# Patient Record
Sex: Male | Born: 1935 | Race: White | Hispanic: No | Marital: Married | Smoking: Never smoker
Health system: Southern US, Community
[De-identification: ages and names within clinical notes are randomized; demographics above are authoritative.]

## PROBLEM LIST (undated history)

## (undated) DIAGNOSIS — I509 Heart failure, unspecified: Secondary | ICD-10-CM

## (undated) DIAGNOSIS — J189 Pneumonia, unspecified organism: Secondary | ICD-10-CM

## (undated) DIAGNOSIS — E119 Type 2 diabetes mellitus without complications: Secondary | ICD-10-CM

## (undated) DIAGNOSIS — N200 Calculus of kidney: Secondary | ICD-10-CM

## (undated) DIAGNOSIS — K922 Gastrointestinal hemorrhage, unspecified: Secondary | ICD-10-CM

## (undated) DIAGNOSIS — S46219A Strain of muscle, fascia and tendon of other parts of biceps, unspecified arm, initial encounter: Secondary | ICD-10-CM

## (undated) DIAGNOSIS — N400 Enlarged prostate without lower urinary tract symptoms: Secondary | ICD-10-CM

## (undated) DIAGNOSIS — N390 Urinary tract infection, site not specified: Secondary | ICD-10-CM

## (undated) DIAGNOSIS — K5792 Diverticulitis of intestine, part unspecified, without perforation or abscess without bleeding: Secondary | ICD-10-CM

## (undated) DIAGNOSIS — K219 Gastro-esophageal reflux disease without esophagitis: Secondary | ICD-10-CM

## (undated) DIAGNOSIS — R112 Nausea with vomiting, unspecified: Secondary | ICD-10-CM

## (undated) DIAGNOSIS — I10 Essential (primary) hypertension: Secondary | ICD-10-CM

## (undated) DIAGNOSIS — T8859XA Other complications of anesthesia, initial encounter: Secondary | ICD-10-CM

## (undated) DIAGNOSIS — T4145XA Adverse effect of unspecified anesthetic, initial encounter: Secondary | ICD-10-CM

## (undated) DIAGNOSIS — Z9889 Other specified postprocedural states: Secondary | ICD-10-CM

## (undated) DIAGNOSIS — I4891 Unspecified atrial fibrillation: Secondary | ICD-10-CM

## (undated) DIAGNOSIS — M109 Gout, unspecified: Secondary | ICD-10-CM

## (undated) HISTORY — PX: HEMORRHOID SURGERY: SHX153

## (undated) HISTORY — PX: LITHOTRIPSY: SUR834

## (undated) HISTORY — PX: CATARACT EXTRACTION, BILATERAL: SHX1313

## (undated) HISTORY — PX: PALATE / UVULA BIOPSY / EXCISION: SUR128

## (undated) HISTORY — PX: CARDIOVERSION: EP1203

## (undated) HISTORY — PX: CHOLECYSTECTOMY: SHX55

## (undated) HISTORY — PX: HERNIA REPAIR: SHX51

## (undated) HISTORY — PX: OTHER SURGICAL HISTORY: SHX169

---

## 1998-09-25 ENCOUNTER — Encounter: Payer: Self-pay | Admitting: Cardiology

## 1998-09-25 ENCOUNTER — Ambulatory Visit (HOSPITAL_COMMUNITY): Admission: RE | Admit: 1998-09-25 | Discharge: 1998-09-25 | Payer: Self-pay | Admitting: Cardiology

## 1999-07-19 ENCOUNTER — Encounter: Payer: Self-pay | Admitting: Gastroenterology

## 1999-07-19 ENCOUNTER — Encounter: Admission: RE | Admit: 1999-07-19 | Discharge: 1999-07-19 | Payer: Self-pay | Admitting: Gastroenterology

## 2004-02-18 ENCOUNTER — Observation Stay (HOSPITAL_COMMUNITY): Admission: EM | Admit: 2004-02-18 | Discharge: 2004-02-18 | Payer: Self-pay | Admitting: Emergency Medicine

## 2004-06-24 ENCOUNTER — Ambulatory Visit (HOSPITAL_COMMUNITY): Admission: RE | Admit: 2004-06-24 | Discharge: 2004-06-24 | Payer: Self-pay | Admitting: Urology

## 2004-08-15 DIAGNOSIS — S46219A Strain of muscle, fascia and tendon of other parts of biceps, unspecified arm, initial encounter: Secondary | ICD-10-CM

## 2004-08-15 HISTORY — PX: DISTAL BICEPS TENDON REPAIR: SHX1461

## 2004-08-15 HISTORY — DX: Strain of muscle, fascia and tendon of other parts of biceps, unspecified arm, initial encounter: S46.219A

## 2005-04-06 ENCOUNTER — Ambulatory Visit (HOSPITAL_COMMUNITY): Admission: RE | Admit: 2005-04-06 | Discharge: 2005-04-06 | Payer: Self-pay | Admitting: Orthopedic Surgery

## 2007-01-01 ENCOUNTER — Ambulatory Visit: Payer: Self-pay | Admitting: Internal Medicine

## 2010-12-28 NOTE — Letter (Signed)
Jan 01, 2007     RE:  ARLAN, BIRKS  MRN:  [id number]  /  DOB:  [birth year]   Jaclyn Prime. Lucas Mallow, M.D.  [address]  [place], [place] [address]   Dear Onalee Hua:   Thank you for referring Ricardo [name] for EP evaluation. As you  know, he is a very pleasant 75 year old male with a history of atrial  flutter, who also has a history of hypertension. His medications include  potassium, __________, HCTZ, Tiazac, Cozaar, and low-dose aspirin. As  you know, the patient has been unable to take Coumadin in the past,  secondary to recurrent problems with epistaxis. He has maintained atrial  flutter for the last year, despite being on amiodarone. The patient has  very little in the way of symptoms with regard to his atrial  arrhythmias, though I suspect that they came on so subtlely, that he has  just not been particularly symptomatic and learned to live with his  flutter. Today, we spent a fair amount of time talking about the issues  of treatment. Because of his need to not be on Coumadin long-term and  because of his thromboembolic risk of atrial flutter, I have recommended  proceeding with catheterization ablation. The patient is considering his  options. He will call us if he would like to proceed further with  ablation therapy. With regard to his Coumadin, if he underwent ablation,  we would plan on proceeding with a TEE and 3 to 4 days of Coumadin,  followed by the ablation, followed by 2 to 3 weeks of Coumadin,  following by discontinuation of the Coumadin. I hope that he would be  tolerant of this short duration of his Coumadin therapy. I do think that  he has an ongoing risk for stroke and for this reason, strongly  considering catheter ablation would be warranted. I have also counseled  that there is a small chance, even if he undergoes ablation, that atrial  fibrillation could develop itself, as these patient's with atrial  flutter, who undergo  ablation, do have a  slight increased risk of developing atrial  fibrillation compared to the basal population.   Once again Theodoro Grist, thank you for referring Ricardo [name] for EP evaluation. I  will keep you apprised as to whether he decides to proceed with ablation  and now this results.    Sincerely,      Doylene Canning. Ladona Ridgel, MD  Electronically Signed    GWT/MedQ  DD: 01/01/2007  DT: 01/01/2007  Job #: [id number]

## 2010-12-28 NOTE — Assessment & Plan Note (Signed)
[facility]                         ELECTROPHYSIOLOGY OFFICE NOTE   Ricardo, [name]                      MRN:          [id number]  DATE:01/01/2007                            DOB:          1935/10/02    REFERRING PHYSICIAN:  Jaclyn Prime. Lucas Mallow, M.D.   Ricardo [name] is a referred today by Dr. Aggie Cosier for consideration and  evaluation of atrial flutter.   HISTORY OF PRESENT ILLNESS:  The patient is a very pleasant 75 year old  man who gives a history of an irregular heart beat now for over 40  years, and over the last year he has been diagnosed with atrial flutter.  He unfortunately on Coumadin developed problems with recurrent epistaxis  and as a consequence had to have his Coumadin therapy discontinued.  He  is referred today for consideration for catheter ablation.  The patient  states that he does not feel much in the way of dyspnea.  He continues  to work a regular day.  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  He has had no more nose bleeds since not being on Coumadin.  He  has never had syncope.  He denies chest pain with exertion.  </   PAST MEDICAL HISTORY:  1. Hypertension for over 15 years.  2. He has history of tonsillectomy, hemorrhoidectomy, gallbladder      surgery and history of problems with gout.   FAMILY HISTORY:  Notable for a mother who died at age 72 and father died  at age 73, unknown causes.   MEDICATIONS:  1. Potassium  2. Inspra  3. Cordarone  4. Hydrochlorothiazide  5. Tiazac  6. Cozaar  7. Aspirin   SOCIAL HISTORY:  The patient works as a Engineer, civil (consulting).  He  denies tobacco use.  He does drink one glass of alcohol per week.   REVIEW OF SYSTEMS:  As noted in the HPI, has problems with gouty  arthritis.  Otherwise all systems reviewed and found to be negative.  The patient is quite stoic.   PHYSICAL EXAMINATION:  He is a pleasant, obese 75 year old man in no  acute distress.  His blood  pressure today was 140/90, the pulse was 90  and irregular.  Respirations were 18.  Weight was 215 pounds.  HEENT:  Normocephalic and atraumatic.  Pupils equal and round.  Oropharynx was moist.  Sclerae were anicteric.  NECK:  No Jugular venous distention, no thyromegaly.  Trachea was  midline.  The carotids were 2+ and symmetric.  LUNGS:  Clear bilaterally to auscultation.  There were no wheezing,  rales, or rhonchi.  CARDIOVASCULAR:  Irregular rhythm with normal S1 and S2.  I did not  appreciate any murmurs, rubs or gallops.  ABDOMEN:  Obese, nontender, nondistended.  There was no organomegaly.  Bowel sounds were present.  There was no rebounding or guarding.  EXTREMITIES:  No cyanosis, clubbing or edema.  The pulses were 2+ and  symmetric.  NEURO:  Alert and oriented x3.  Cranial nerves were intact.  Strength  was 5/5 and symmetric.   EKG demonstrates  atrial flutter with a variable ventricular response.   IMPRESSION:  1. Atrial flutter.  2. Hypertension.  3. Obesity.  4. History of nose bleeds on Coumadin.   DISCUSSION:  I discussed treatment options with Ricardo [name] in detail.  I  have recommended proceeding with electrophysiological _________ catheter  ablation.  He would require a TEE and Coumadin initiation for several  weeks after his ablation, though I think this gives the best chance of  minimizing his risk for stroke and helping him to reduce the risk of  bleeding if we can maintain him in sinus rhythm and also reduce the risk  for the need for Coumadin.  If he decides to proceed with ablation, we  will plan on scheduling this at the earliest possible convenient time.     Doylene Canning. Ladona Ridgel, MD  Electronically Signed    GWT/MedQ  DD: 01/01/2007  DT: 01/01/2007  Job #: [id number]   cc:   Jaclyn Prime. Lucas Mallow, M.D.

## 2012-04-17 ENCOUNTER — Observation Stay (HOSPITAL_COMMUNITY)
Admission: EM | Admit: 2012-04-17 | Discharge: 2012-04-18 | DRG: 174 | Disposition: A | Discharge status: Home / Self Care | Payer: BC Managed Care – PPO | Attending: Internal Medicine | Admitting: Internal Medicine

## 2012-04-17 ENCOUNTER — Encounter (HOSPITAL_COMMUNITY): Payer: Self-pay | Admitting: Emergency Medicine

## 2012-04-17 DIAGNOSIS — K5792 Diverticulitis of intestine, part unspecified, without perforation or abscess without bleeding: Secondary | ICD-10-CM | POA: Diagnosis present

## 2012-04-17 DIAGNOSIS — D649 Anemia, unspecified: Secondary | ICD-10-CM | POA: Insufficient documentation

## 2012-04-17 DIAGNOSIS — K5790 Diverticulosis of intestine, part unspecified, without perforation or abscess without bleeding: Secondary | ICD-10-CM

## 2012-04-17 DIAGNOSIS — R1032 Left lower quadrant pain: Secondary | ICD-10-CM | POA: Insufficient documentation

## 2012-04-17 DIAGNOSIS — K5732 Diverticulitis of large intestine without perforation or abscess without bleeding: Secondary | ICD-10-CM

## 2012-04-17 DIAGNOSIS — K922 Gastrointestinal hemorrhage, unspecified: Secondary | ICD-10-CM | POA: Diagnosis present

## 2012-04-17 DIAGNOSIS — I4891 Unspecified atrial fibrillation: Secondary | ICD-10-CM

## 2012-04-17 DIAGNOSIS — K921 Melena: Secondary | ICD-10-CM

## 2012-04-17 DIAGNOSIS — N400 Enlarged prostate without lower urinary tract symptoms: Secondary | ICD-10-CM

## 2012-04-17 DIAGNOSIS — K5730 Diverticulosis of large intestine without perforation or abscess without bleeding: Secondary | ICD-10-CM | POA: Insufficient documentation

## 2012-04-17 DIAGNOSIS — Z7901 Long term (current) use of anticoagulants: Secondary | ICD-10-CM | POA: Insufficient documentation

## 2012-04-17 DIAGNOSIS — N200 Calculus of kidney: Secondary | ICD-10-CM

## 2012-04-17 DIAGNOSIS — E876 Hypokalemia: Secondary | ICD-10-CM | POA: Insufficient documentation

## 2012-04-17 HISTORY — DX: Benign prostatic hyperplasia without lower urinary tract symptoms: N40.0

## 2012-04-17 HISTORY — DX: Calculus of kidney: N20.0

## 2012-04-17 HISTORY — DX: Unspecified atrial fibrillation: I48.91

## 2012-04-17 HISTORY — DX: Strain of muscle, fascia and tendon of other parts of biceps, unspecified arm, initial encounter: S46.219A

## 2012-04-17 HISTORY — DX: Diverticulitis of intestine, part unspecified, without perforation or abscess without bleeding: K57.92

## 2012-04-17 HISTORY — DX: Gout, unspecified: M10.9

## 2012-04-17 LAB — PREPARE RBC (CROSSMATCH)

## 2012-04-17 LAB — CBC
HCT: 35.7 % — ABNORMAL LOW (ref 39.0–52.0)
Hemoglobin: 12.1 g/dL — ABNORMAL LOW (ref 13.0–17.0)
Hemoglobin: 12.3 g/dL — ABNORMAL LOW (ref 13.0–17.0)
RBC: 3.96 MIL/uL — ABNORMAL LOW (ref 4.22–5.81)
RBC: 4.15 MIL/uL — ABNORMAL LOW (ref 4.22–5.81)
RDW: 14.6 % (ref 11.5–15.5)
WBC: 11 10*3/uL — ABNORMAL HIGH (ref 4.0–10.5)
WBC: 8.9 10*3/uL (ref 4.0–10.5)

## 2012-04-17 LAB — COMPREHENSIVE METABOLIC PANEL
AST: 33 U/L (ref 0–37)
Albumin: 3.6 g/dL (ref 3.5–5.2)
BUN: 16 mg/dL (ref 6–23)
CO2: 28 mEq/L (ref 19–32)
Calcium: 9.2 mg/dL (ref 8.4–10.5)
Creatinine, Ser: 1.09 mg/dL (ref 0.50–1.35)
GFR calc non Af Amer: 65 mL/min — ABNORMAL LOW (ref >90)
Total Bilirubin: 0.8 mg/dL (ref 0.3–1.2)

## 2012-04-17 LAB — CBC WITH DIFFERENTIAL/PLATELET
Basophils Absolute: 0 10*3/uL (ref 0.0–0.1)
Basophils Relative: 0 % (ref 0–1)
Eosinophils Relative: 1 % (ref 0–5)
MCHC: 35.1 g/dL (ref 30.0–36.0)
MCV: 86.3 fL (ref 78.0–100.0)
Monocytes Absolute: 1 10*3/uL (ref 0.1–1.0)
Monocytes Relative: 8 % (ref 3–12)
RBC: 4.66 MIL/uL (ref 4.22–5.81)
RDW: 14.5 % (ref 11.5–15.5)
WBC: 12.9 10*3/uL — ABNORMAL HIGH (ref 4.0–10.5)

## 2012-04-17 LAB — PROTIME-INR: INR: 2.1 — ABNORMAL HIGH (ref 0.00–1.49)

## 2012-04-17 MED ORDER — ONDANSETRON HCL 4 MG PO TABS: 4.0000 mg | ORAL_TABLET | Freq: Four times a day (QID) | ORAL | Status: DC | PRN

## 2012-04-17 MED ORDER — POTASSIUM CHLORIDE 2 MEQ/ML IV SOLN
INTRAVENOUS | Status: DC
  Administered 2012-04-17: 13:00:00 via INTRAVENOUS
  Filled 2012-04-17: qty 1000

## 2012-04-17 MED ORDER — LOSARTAN POTASSIUM 50 MG PO TABS
50.0000 mg | ORAL_TABLET | Freq: Every day | ORAL | Status: DC
  Administered 2012-04-17 – 2012-04-18 (×2): 50 mg via ORAL
  Filled 2012-04-17 (×2): qty 1

## 2012-04-17 MED ORDER — SODIUM CHLORIDE 0.9 % IV SOLN
INTRAVENOUS | Status: AC
  Administered 2012-04-17: 75 mL/h via INTRAVENOUS

## 2012-04-17 MED ORDER — OXYCODONE HCL 5 MG PO TABS: 5.0000 mg | ORAL_TABLET | ORAL | Status: DC | PRN

## 2012-04-17 MED ORDER — ACETAMINOPHEN 325 MG PO TABS: 650.0000 mg | ORAL_TABLET | Freq: Four times a day (QID) | ORAL | Status: DC | PRN

## 2012-04-17 MED ORDER — ACETAMINOPHEN 650 MG RE SUPP: 650.0000 mg | Freq: Four times a day (QID) | RECTAL | Status: DC | PRN

## 2012-04-17 MED ORDER — PANTOPRAZOLE SODIUM 40 MG IV SOLR
40.0000 mg | INTRAVENOUS | Status: DC
  Administered 2012-04-17: 40 mg via INTRAVENOUS

## 2012-04-17 MED ORDER — VITAMIN K1 10 MG/ML IJ SOLN
5.0000 mg | Freq: Once | INTRAMUSCULAR | Status: AC
  Administered 2012-04-17: 5 mg via INTRAVENOUS
  Filled 2012-04-17 (×2): qty 0.5

## 2012-04-17 MED ORDER — SODIUM CHLORIDE 0.9 % IV SOLN
INTRAVENOUS | Status: DC
  Administered 2012-04-17: 16:00:00 via INTRAVENOUS

## 2012-04-17 MED ORDER — ONDANSETRON HCL 4 MG/2ML IJ SOLN: 4.0000 mg | Freq: Four times a day (QID) | INTRAMUSCULAR | Status: DC | PRN

## 2012-04-17 MED ORDER — SODIUM CHLORIDE 0.9 % IV SOLN: INTRAVENOUS | Status: DC

## 2012-04-17 MED ORDER — DILTIAZEM HCL ER BEADS 120 MG PO CP24: 360.0000 mg | ORAL_CAPSULE | Freq: Every day | ORAL | Status: DC

## 2012-04-17 MED ORDER — DILTIAZEM HCL ER COATED BEADS 360 MG PO CP24
360.0000 mg | ORAL_CAPSULE | Freq: Every day | ORAL | Status: DC
  Administered 2012-04-17 – 2012-04-18 (×2): 360 mg via ORAL
  Filled 2012-04-17 (×2): qty 1

## 2012-04-17 MED ORDER — SENNOSIDES-DOCUSATE SODIUM 8.6-50 MG PO TABS
1.0000 | ORAL_TABLET | Freq: Every evening | ORAL | Status: DC | PRN
  Filled 2012-04-17: qty 1

## 2012-04-17 MED ORDER — ALLOPURINOL 300 MG PO TABS
300.0000 mg | ORAL_TABLET | Freq: Every day | ORAL | Status: DC
  Administered 2012-04-17 – 2012-04-18 (×2): 300 mg via ORAL
  Filled 2012-04-17 (×3): qty 1

## 2012-04-17 MED ORDER — MORPHINE SULFATE 2 MG/ML IJ SOLN: 1.0000 mg | INTRAMUSCULAR | Status: DC | PRN

## 2012-04-17 MED ORDER — PEG-KCL-NACL-NASULF-NA ASC-C 100 G PO SOLR
1.0000 | Freq: Once | ORAL | Status: AC
  Administered 2012-04-17: 100 g via ORAL
  Filled 2012-04-17: qty 1

## 2012-04-17 NOTE — Consult Note (Signed)
[facility] Consult: 1:57 PM 04/17/2012   Referring Provider: Dr Wellington Hampshire in ED Primary Care Physician:  Thayer Headings, MD,  Never goes to see him. Gets only care from cardiologist in [place], Dr Duard Brady Primary Gastroenterologist:  Remotely saw Dr Randa Evens   Reason for Consultation:  Painless hematochezia.  HPI: Ricardo [name] is a 75 y.o. male.  Chronic Coumadin for hx of refractory afib, despite DCCVs and ablation in past.  Apparent hx of diverticulitis, though 2 CT scans I reviewed show only diverticulosis, especially in sigmoid.  Has refused colonoscopy in past.  S/P hemorrhoidectomies x 2, remotely.  No recent hx of rectal bleeding until now.  Self treated LLQ pain, his "diverticulitis" equivalent, with 7 days of Augmentin beginning 8/22.  Pain resolved.  Never had fever, chills or nausea. He gets standing prescriptions of Augmentin provided by his cardiologist apparently.   Last 4 days having decreased appetite.  No nausea, no belly pain.  No weight loss. No change in stool volume or appearance.  Did have a day or so of no BMs around 8/22, which is typical for "diverticulitis".  5 AM today, first of 3 episodes of large volume, painless hematochezia.  In ED the Hgb is 14.1 and INR is 2.1.  BUN is normal.   Denies dizziness, weakness.   Stopped taking Tiazac on his own, without cardiologist input, about 2 weeks ago because of LE edema, the edema has resolved.  He had read that Tiazac can cause swelling.      Past Medical History  Diagnosis Date  . Diverticulitis     question if he ever had imaging confirmed diverticulitis  . Kidney stones     treated with lithotripsy   . Rupture of biceps tendon 2006    left distal bicep tendon  . BPH (benign prostatic hyperplasia)     by ST in 2000.   Marland Kitchen Atrial fibrillation     on coumadin.  cardiologist is in [facility] dr Duard Brady.  S/P multiple DCCVs and prior ablation.   .  Gout     Past Surgical History  Procedure Date  . Cholecystectomy   . Hemorrhoid surgery     twice  . Distal biceps tendon repair 2006    dr Pecolia Ades  . Lithotripsy     of kidney stones.     Prior to Admission medications   Medication Sig Start Date End Date Taking? Authorizing Provider  allopurinol (ZYLOPRIM) 300 MG tablet Take 300 mg by mouth daily.   Yes Historical Provider, MD  Ascorbic Acid (VITAMIN C PO) Take 1 tablet by mouth daily.   Yes Historical Provider, MD  b complex vitamins tablet Take 1 tablet by mouth daily.   Yes Historical Provider, MD  CALCIUM-MAGNESIUM-VITAMIN D PO Take 1 tablet by mouth daily.   Yes Historical Provider, MD  Coenzyme Q10 (CO Q 10 PO) Take 1 tablet by mouth daily.   Yes Historical Provider, MD  diltiazem (TIAZAC) 360 MG 24 hr capsule He has not used this for at least 2 weeks, self d/c'd due to LE edema Take 360 mg by mouth daily.    Yes Historical Provider, MD  losartan (COZAAR) 50 MG tablet Take 50 mg by mouth daily.   Yes Historical Provider, MD  MAGNESIUM PO Take 1 tablet by mouth daily.   Yes Historical Provider, MD  potassium citrate (UROCIT-K) 10 MEQ (1080 MG) SR tablet Take 20 mEq by mouth daily.   Yes Historical Provider, MD  warfarin (  COUMADIN) 3 MG tablet Take 3 mg by mouth daily.   Yes Historical Provider, MD    Scheduled Meds:    . sodium chloride   Intravenous STAT  . allopurinol  300 mg Oral Daily  . diltiazem  360 mg Oral Daily  . losartan  50 mg Oral Daily  . pantoprazole (PROTONIX) IV  40 mg Intravenous Q24H  . phytonadione (VITAMIN K) IV  5 mg Intravenous Once  . DISCONTD: diltiazem  360 mg Oral Daily   Infusions:    . sodium chloride    . DISCONTD: sodium chloride 0.9 % 1,000 mL with potassium chloride 10 mEq infusion 50 mL/hr at 04/17/12 1300   PRN Meds: acetaminophen, acetaminophen, morphine injection, ondansetron (ZOFRAN) IV, ondansetron, oxyCODONE, senna-docusate   Allergies as of 04/17/2012 - Review  Complete 04/17/2012  Allergen Reaction Noted  . Codeine  04/17/2012    No family history on file.  History   Social History  . Marital Status: Married    Spouse Name: N/A    Number of Children: N/A  . Years of Education: N/A   Occupational History  . Engineer, agricultural"   Social History Main Topics  . Smoking status: Never Smoker   . Smokeless tobacco: Not on file  . Alcohol Use: Does not drink ETOH  . Drug Use: No  . Sexually Active: Not queried.    REVIEW OF SYSTEMS: No chest pain.  No SOB.  No cough.  No pnd. No skin sores or rash No headache.  No tingling or numbness. Pedal edema resolved as per HPI No fainting or presyncope. Low back stiffness on occasion.  No gout attacks for a long time, since started on Allopurinol No ASA or NSAID use No nocturia, urinary incontinence or dribbling stream    PHYSICAL EXAM: Vital signs in last 24 hours: Temp:  [97.9 F (36.6 C)-98 F (36.7 C)] 97.9 F (36.6 C) (09/03 1003) Pulse Rate:  [62-95] 63  (09/03 1046) Resp:  [15-18] 18  (09/03 1046) BP: (146-164)/(77-110) 146/98 mmHg (09/03 1046) SpO2:  [97 %-100 %] 98 % (09/03 1046) Weight:  [186 lb 4.6 oz (84.5 kg)] 186 lb 4.6 oz (84.5 kg) (09/03 1046)  General: looks well, not ill appearing, older WM. Looks his age Head:  No facial asymmetry or swelling  Eyes:  No icterus or conj pallor Ears:  Not HOH.  Nose:  No congestion Mouth:  Moist, pink, clear MM.  Good dentition Neck:  No mass, no JVD Lungs:  Clear, no SOB or cough Heart: Irregularly irregular,  Runs of rapid rates interspersed with runs of irrelular but normal rates Abdomen:  Soft, protuberant, ventral diasthesis, NT.  No masses, no bruits.   Rectal: red blood without mass or hemorrhoids per ED physician.   Musc/Skeltl: no gross joint deformity Extremities:  No pedal edema.  3 polus pedal pulses  Neurologic:  Oriented x 3.  No tremor.  Very alert.  No gross deficits Skin:  No  significant purpura. No sores. Tattoos:  None seen Nodes:  No inguinal masses   Psych:  Pleasant.  Cooperative.   LAB RESULTS:  Basename 04/17/12 0747  WBC 12.9*  HGB 14.1  HCT 40.2  PLT 203  MCV           86   BMET Lab Results  Component Value Date   NA 141 04/17/2012   K 4.5 04/17/2012   CL 100 04/17/2012   CO2 28 04/17/2012   GLUCOSE 154*  04/17/2012   BUN 16 04/17/2012   CREATININE 1.09 04/17/2012   CALCIUM 9.2 04/17/2012   LFT  Basename 04/17/12 0747  PROT 7.0  ALBUMIN 3.6  AST 33  ALT 13  ALKPHOS 59  BILITOT 0.8  BILIDIR --  IBILI --   PT/INR Lab Results  Component Value Date   INR 2.10* 04/17/2012    RADIOLOGY STUDIES: CT scans abd/pelvis in 2000 and 2006 reviewed.   ENDOSCOPIC STUDIES: Refused colonoscopy in the past.  No EGDs  IMPRESSION: *  Lower GI bleed.  Rule out diverticular, rule out neoplasia, rule out colitis, rule out vascular malformation, rule out hemorrhoidal. *  ? Hx of diverticulitis.  The only CT imaging in 2000 and 2006 have not shown diverticulitis, only diverticulosis.  Self treats with Augmentin for episodic LLQ pain.  Latest bout began August 22, with resolution of the pain.  WBCs are moderately elevated.  No fever.  *  Chronic a fib, failed DCCVs and ablation in past.   *  Chronic coumadin. INR is 2.0 *  Hyperglycemia.  No hx of DM.  PLAN: *  Needs colonoscopy when INR is near 1.5.  Pt willing to undergo this.  *  Needs to remain off Coumadin, pt and his wife feel that Dr Orson Aloe in [place] should be notified and phone consulted about this.  *  Keep on clears.  *  INR and CBC in AM *  Not inclined at present to activley reverse the Coumadin, but if ongoing bleeding or HD instability, could give Vit K or FFP. *  IVF at 50 cc/hour:  NS with 10 KCL.  *  Not bleeding enough at present for RBC nuclear scan.    LOS: 0 days   Jennye Moccasin  04/17/2012, 1:57 PM Pager: [phone]  2 PM addendum.  Note that pt was started on IV Protonix,  I  discontinued this.  All signs point to lower GI bleed.  The BUN is normal.  I have not initiated any measures yet regarding prep for colonoscopy except to start clear liquids.     ________________________________________________________________________  [facility] MD note:  I personally examined the patient, reviewed the data and agree with the assessment and plan described above.  Colonoscopy tomorrow at 8am.   Rob Bunting, MD [facility] Pager [phone]

## 2012-04-17 NOTE — Progress Notes (Addendum)
Disposition Note  Ricardo [name], is a 76 y.o. male,   MRN: [id number]  -  DOB - 03-10-1936  Outpatient Primary MD for the patient is Thayer Headings, MD   Blood pressure 149/77, pulse 74, temperature 98 F (36.7 C), temperature source Oral, resp. rate 16, SpO2 100.00%.  Active Problems:  GI bleed  Chronic anticoagulation  Atrial fibrillation  Diverticulitis  Kidney stones  BPH (benign prostatic hyperplasia)    76 yo male with history of afib on coumadin, and diverticulitis reports recent LLQ pain similar to his previous diverticulitis pain in the past.  His started taking Augmentin for this and the pain has improved.  He had 1 episode of a large amount of BRBPR this am.  Vitals are stable (BP high).  Hgb stable at 14, INR approx 2.0.  He has not been given anything to reverse his INR.  Although this gentleman is stable at this point he has had a Large bloody bowel movement this am and is on coumadin.  Because of this I will request a step down bed at least initially.  [facility] has been called to consult.  We will request he remain NPO until they have evaluated him.   Algis Downs, PA-C [facility] Pager: [phone]

## 2012-04-17 NOTE — ED Provider Notes (Signed)
History     CSN: [id number]  Arrival date & time 04/17/12  0712   First MD Initiated Contact with Patient 04/17/12 0719      Chief Complaint  Patient presents with  . Rectal Bleeding    (Consider location/radiation/quality/duration/timing/severity/associated sxs/prior treatment) HPI Comments: Presents today with rectal bleeding. He states that he has recurrent episodes of diverticulitis. He started having some discomfort in his left lower quadrant consistent with his past diverticulitis flareups about a week and half ago. He started taking antibiotics that he had at home which was Augmentin. He states that his pain associated with the diverticulitis is much better. However this morning when he woke up about 5 AM he had a bowel movement which was sure red blood. He's had only one episode of bleeding this morning. However he does state with a large amount of ongoing bleeding in the toilet. He denies any current abdominal pain. Denies any fevers or chills. Denies any nausea or vomiting. Denies any chest pain or shortness of breath. Denies any dizziness. Denies a history of GI bleeds in the past. He does not currently have a gastroenterologist and has never had a colonoscopy in the past. He sees Dr. Thea Silversmith his primary care physician in [place]. But his health is primarily managed by his cardiologist she's in [place].  Patient is a 76 y.o. male presenting with hematochezia.  Rectal Bleeding  Pertinent negatives include no fever, no abdominal pain, no diarrhea, no nausea, no vomiting, no hematuria, no chest pain, no headaches, no coughing and no rash.    Past Medical History  Diagnosis Date  . Atrial fibrillation     on coumadin.  cardiologist is in [facility] dr Duard Brady.  S/P multiple DCCVs and prior ablation.   . Diverticulitis   . Kidney stones   . Rupture of biceps tendon 2006    left distal bicep tendon  . BPH (benign prostatic hyperplasia)     by ST in 2000.      Past Surgical History  Procedure Date  . Cholecystectomy   . Hemorrhoid surgery     twice  . Distal biceps tendon repair 2006    dr Pecolia Ades    No family history on file.  History  Substance Use Topics  . Smoking status: Never Smoker   . Smokeless tobacco: Not on file  . Alcohol Use: No      Review of Systems  Constitutional: Negative for fever, chills, diaphoresis and fatigue.  HENT: Negative for congestion, rhinorrhea and sneezing.   Eyes: Negative.   Respiratory: Negative for cough, chest tightness and shortness of breath.   Cardiovascular: Negative for chest pain and leg swelling.  Gastrointestinal: Positive for blood in stool and hematochezia. Negative for nausea, vomiting, abdominal pain and diarrhea.  Genitourinary: Negative for frequency, hematuria, flank pain and difficulty urinating.  Musculoskeletal: Negative for back pain and arthralgias.  Skin: Negative for rash.  Neurological: Negative for dizziness, speech difficulty, weakness, numbness and headaches.    Allergies  Codeine  Home Medications   Current Outpatient Rx  Name Route Sig Dispense Refill  . ALLOPURINOL 300 MG PO TABS Oral Take 300 mg by mouth daily.    Marland Kitchen VITAMIN C PO Oral Take 1 tablet by mouth daily.    . B COMPLEX PO TABS Oral Take 1 tablet by mouth daily.    Marland Kitchen CALCIUM-MAGNESIUM-VITAMIN D PO Oral Take 1 tablet by mouth daily.    . CO Q 10 PO Oral Take 1  tablet by mouth daily.    Marland Kitchen DILTIAZEM HCL ER BEADS 360 MG PO CP24 Oral Take 360 mg by mouth daily.    Marland Kitchen LOSARTAN POTASSIUM 50 MG PO TABS Oral Take 50 mg by mouth daily.    Marland Kitchen MAGNESIUM PO Oral Take 1 tablet by mouth daily.    Marland Kitchen POTASSIUM CITRATE ER 10 MEQ (1080 MG) PO TBCR Oral Take 20 mEq by mouth daily.    . WARFARIN SODIUM 3 MG PO TABS Oral Take 3 mg by mouth daily.      BP 156/85  Pulse 62  Temp 97.9 F (36.6 C) (Oral)  Resp 15  SpO2 99%  Physical Exam  Constitutional: He is oriented to person, place, and time. He appears  well-developed and well-nourished.  HENT:  Head: Normocephalic and atraumatic.  Eyes: Pupils are equal, round, and reactive to light.  Neck: Normal range of motion. Neck supple.  Cardiovascular: Normal rate, regular rhythm and normal heart sounds.   Pulmonary/Chest: Effort normal and breath sounds normal. No respiratory distress. He has no wheezes. He has no rales. He exhibits no tenderness.  Abdominal: Soft. Bowel sounds are normal. There is tenderness (Very mild tenderness in the left midabdomen). There is no rebound and no guarding.  Genitourinary:       Right red blood noted on rectal exam  Musculoskeletal: Normal range of motion. He exhibits no edema.  Lymphadenopathy:    He has no cervical adenopathy.  Neurological: He is alert and oriented to person, place, and time.  Skin: Skin is warm and dry. No rash noted.  Psychiatric: He has a normal mood and affect.    ED Course  Procedures (including critical care time)  Results for orders placed during the hospital encounter of 04/17/12  CBC WITH DIFFERENTIAL      Component Value Range   WBC 12.9 (*) 4.0 - 10.5 K/uL   RBC 4.66  4.22 - 5.81 MIL/uL   Hemoglobin 14.1  13.0 - 17.0 g/dL   HCT 40.9  81.1 - 91.4 %   MCV 86.3  78.0 - 100.0 fL   MCH 30.3  26.0 - 34.0 pg   MCHC 35.1  30.0 - 36.0 g/dL   RDW 78.2  95.6 - 21.3 %   Platelets 203  150 - 400 K/uL   Neutrophils Relative 78 (*) 43 - 77 %   Neutro Abs 10.1 (*) 1.7 - 7.7 K/uL   Lymphocytes Relative 13  12 - 46 %   Lymphs Abs 1.7  0.7 - 4.0 K/uL   Monocytes Relative 8  3 - 12 %   Monocytes Absolute 1.0  0.1 - 1.0 K/uL   Eosinophils Relative 1  0 - 5 %   Eosinophils Absolute 0.1  0.0 - 0.7 K/uL   Basophils Relative 0  0 - 1 %   Basophils Absolute 0.0  0.0 - 0.1 K/uL  COMPREHENSIVE METABOLIC PANEL      Component Value Range   Sodium 141  135 - 145 mEq/L   Potassium 4.5  3.5 - 5.1 mEq/L   Chloride 100  96 - 112 mEq/L   CO2 28  19 - 32 mEq/L   Glucose, Bld 154 (*) 70 - 99  mg/dL   BUN 16  6 - 23 mg/dL   Creatinine, Ser 0.86  0.50 - 1.35 mg/dL   Calcium 9.2  8.4 - 57.8 mg/dL   Total Protein 7.0  6.0 - 8.3 g/dL   Albumin 3.6  3.5 -  5.2 g/dL   AST 33  0 - 37 U/L   ALT 13  0 - 53 U/L   Alkaline Phosphatase 59  39 - 117 U/L   Total Bilirubin 0.8  0.3 - 1.2 mg/dL   GFR calc non Af Amer 65 (*) >90 mL/min   GFR calc Af Amer 75 (*) >90 mL/min  TYPE AND SCREEN      Component Value Range   ABO/RH(D) O POS     Antibody Screen NEG     Sample Expiration 04/20/2012    PROTIME-INR      Component Value Range   Prothrombin Time 23.9 (*) 11.6 - 15.2 seconds   INR 2.10 (*) 0.00 - 1.49  ABO/RH      Component Value Range   ABO/RH(D) O POS     No results found.   No results found.   Date: 04/17/2012  Rate: 146  Rhythm: atrial fibrillation  QRS Axis: normal  Intervals: normal  ST/T Wave abnormalities: nonspecific ST/T changes  Conduction Disutrbances:none  Narrative Interpretation:   Old EKG Reviewed: none available   1. GI bleed       MDM  Discussed with GI who will see pt.  Also discussed with hospitalist who will admit pt.  Pt has had no further episodes of bleeding in ED, so did not reverse coumadin at this point.        Rolan Bucco, MD 04/17/12 1023

## 2012-04-17 NOTE — ED Notes (Signed)
Pt reports woke up this morning went to have bowel movement, noticed bright red blood. Hx diverticulitis initially complaining of LLQ pain. No pain at this time. Alert x4.

## 2012-04-17 NOTE — ED Notes (Signed)
Pt reporting hx of diverticulitis. This morning woke up to have bm, noticed rectal bleeding this morning. Bright red bleeding. Initially c/o left lower quad pain. No bleeding since then. Skin warm and dry. No pain.

## 2012-04-17 NOTE — ED Notes (Signed)
Gastroenterology at bedside. Pt taken to bathroom by wheelchair, had rectal bleeding. Moderate amount,dark red.

## 2012-04-17 NOTE — H&P (Signed)
[facility]          History and Physical    PCP:   Thayer Headings, MD   Cardiologist: Dr. Orson Aloe at [facility]  Chief Complaint:  Bright red blood per rectum  HPI: Ricardo [name] is a very pleasant 76 y/o man with h/o diverticulosis, atrial fibrillation on chronic anticoagulation with coumadin, who presented to the hospital with the above-mentioned complaints. On Aug 22 he self treated himself with Augmentin for a presumed episode of diverticulitis. He was doing well until this am at 5:00 when he went to the bathroom and noticed a large amount of bright red blood in the toilet. He had a total of 3 episodes at home. He has had another episode while in the step-down unit. He came to the hospital and we are asked to admit him for further evaluation and management. [facility] has already been consulted and has seen the patient.  Allergies:   Allergies  Allergen Reactions  . Codeine       Past Medical History  Diagnosis Date  . Diverticulitis     question if he ever had imaging confirmed diverticulitis  . Kidney stones   . Rupture of biceps tendon 2006    left distal bicep tendon  . BPH (benign prostatic hyperplasia)     by ST in 2000.   Marland Kitchen Atrial fibrillation     on coumadin.  cardiologist is in [facility] dr Duard Brady.  S/P multiple DCCVs and prior ablation.     Past Surgical History  Procedure Date  . Cholecystectomy   . Hemorrhoid surgery     twice  . Distal biceps tendon repair 2006    dr Pecolia Ades    Prior to Admission medications   Medication Sig Start Date End Date Taking? Authorizing Provider  allopurinol (ZYLOPRIM) 300 MG tablet Take 300 mg by mouth daily.   Yes Historical Provider, MD  Ascorbic Acid (VITAMIN C PO) Take 1 tablet by mouth daily.   Yes Historical Provider, MD  b complex vitamins tablet Take 1 tablet by mouth daily.   Yes Historical Provider, MD  CALCIUM-MAGNESIUM-VITAMIN D PO Take 1 tablet by mouth daily.   Yes Historical  Provider, MD  Coenzyme Q10 (CO Q 10 PO) Take 1 tablet by mouth daily.   Yes Historical Provider, MD  diltiazem (TIAZAC) 360 MG 24 hr capsule Take 360 mg by mouth daily.   Yes Historical Provider, MD  losartan (COZAAR) 50 MG tablet Take 50 mg by mouth daily.   Yes Historical Provider, MD  MAGNESIUM PO Take 1 tablet by mouth daily.   Yes Historical Provider, MD  potassium citrate (UROCIT-K) 10 MEQ (1080 MG) SR tablet Take 20 mEq by mouth daily.   Yes Historical Provider, MD  warfarin (COUMADIN) 3 MG tablet Take 3 mg by mouth daily.   Yes Historical Provider, MD    Social History:  reports that he has never smoked. He does not have any smokeless tobacco history on file. He reports that he does not drink alcohol or use illicit drugs.  No family history on file.  Review of Systems:  Constitutional: Denies fever, chills, diaphoresis, appetite change and fatigue.  HEENT: Denies photophobia, eye pain, redness, hearing loss, ear pain, congestion, sore throat, rhinorrhea, sneezing, mouth sores, trouble swallowing, neck pain, neck stiffness and tinnitus.   Respiratory: Denies SOB, DOE, cough, chest tightness,  and wheezing.   Cardiovascular: Denies chest pain, palpitations and leg swelling.  Gastrointestinal: Denies nausea, vomiting,  diarrhea, constipation,  abdominal distention.  Genitourinary: Denies dysuria, urgency, frequency, hematuria, flank pain and difficulty urinating.  Musculoskeletal: Denies myalgias, back pain, joint swelling, arthralgias and gait problem.  Skin: Denies pallor, rash and wound.  Neurological: Denies dizziness, seizures, syncope, weakness, light-headedness, numbness and headaches.  Hematological: Denies adenopathy. Easy bruising, personal or family bleeding history  Psychiatric/Behavioral: Denies suicidal ideation, mood changes, confusion, nervousness, sleep disturbance and agitation   Physical Exam: Blood pressure 156/85, pulse 62, temperature 97.9 F (36.6 C),  temperature source Oral, resp. rate 15, SpO2 99.00%. General: AAOx3 in no current distress. HEENT: [place]/AT, PERRL, EOMI, moist mucous membranes, wears corrective lenses. Neck: no JVD, no LAD, no bruits, no goiter. CV: tachy, irregular, no M/R/G Lungs: CTA B Abdomen: S/NT/ND/+BS/no masses or organomegaly noted. Ext: no C/C/E, +pedal pulses. Neuro: grossly intact and nonfocal. Skin: No rashes seen.  Labs on Admission:  Results for orders placed during the hospital encounter of 04/17/12 (from the past 48 hour(s))  CBC WITH DIFFERENTIAL     Status: Abnormal   Collection Time   04/17/12  7:47 AM      Component Value Range Comment   WBC 12.9 (*) 4.0 - 10.5 K/uL    RBC 4.66  4.22 - 5.81 MIL/uL    Hemoglobin 14.1  13.0 - 17.0 g/dL    HCT 16.1  09.6 - 04.5 %    MCV 86.3  78.0 - 100.0 fL    MCH 30.3  26.0 - 34.0 pg    MCHC 35.1  30.0 - 36.0 g/dL    RDW 40.9  81.1 - 91.4 %    Platelets 203  150 - 400 K/uL    Neutrophils Relative 78 (*) 43 - 77 %    Neutro Abs 10.1 (*) 1.7 - 7.7 K/uL    Lymphocytes Relative 13  12 - 46 %    Lymphs Abs 1.7  0.7 - 4.0 K/uL    Monocytes Relative 8  3 - 12 %    Monocytes Absolute 1.0  0.1 - 1.0 K/uL    Eosinophils Relative 1  0 - 5 %    Eosinophils Absolute 0.1  0.0 - 0.7 K/uL    Basophils Relative 0  0 - 1 %    Basophils Absolute 0.0  0.0 - 0.1 K/uL   COMPREHENSIVE METABOLIC PANEL     Status: Abnormal   Collection Time   04/17/12  7:47 AM      Component Value Range Comment   Sodium 141  135 - 145 mEq/L    Potassium 4.5  3.5 - 5.1 mEq/L HEMOLYSIS AT THIS LEVEL MAY AFFECT RESULT   Chloride 100  96 - 112 mEq/L    CO2 28  19 - 32 mEq/L    Glucose, Bld 154 (*) 70 - 99 mg/dL    BUN 16  6 - 23 mg/dL    Creatinine, Ser 7.82  0.50 - 1.35 mg/dL    Calcium 9.2  8.4 - 95.6 mg/dL    Total Protein 7.0  6.0 - 8.3 g/dL    Albumin 3.6  3.5 - 5.2 g/dL    AST 33  0 - 37 U/L    ALT 13  0 - 53 U/L HEMOLYSIS AT THIS LEVEL MAY AFFECT RESULT   Alkaline Phosphatase 59  39 -  117 U/L HEMOLYSIS AT THIS LEVEL MAY AFFECT RESULT   Total Bilirubin 0.8  0.3 - 1.2 mg/dL    GFR calc non Af Amer 65 (*) >90 mL/min  GFR calc Af Amer 75 (*) >90 mL/min   PROTIME-INR     Status: Abnormal   Collection Time   04/17/12  7:47 AM      Component Value Range Comment   Prothrombin Time 23.9 (*) 11.6 - 15.2 seconds    INR 2.10 (*) 0.00 - 1.49   TYPE AND SCREEN     Status: Normal   Collection Time   04/17/12  8:00 AM      Component Value Range Comment   ABO/RH(D) O POS      Antibody Screen NEG      Sample Expiration 04/20/2012     ABO/RH     Status: Normal   Collection Time   04/17/12  8:00 AM      Component Value Range Comment   ABO/RH(D) O POS     MRSA PCR SCREENING     Status: Normal   Collection Time   04/17/12 10:53 AM      Component Value Range Comment   MRSA by PCR NEGATIVE  NEGATIVE     Radiological Exams on Admission: No results found.  Assessment/Plan Principal Problem:  *GI bleed Active Problems:  Hematochezia  Atrial fibrillation  Diverticulitis  Kidney stones  BPH (benign prostatic hyperplasia)  Chronic anticoagulation   #1 GI Bleed/Hematochezia: Is painless in nature. Likely diverticular. Hb is currently stable at 14.1. Will cycle CBCs q 8 hours and place 2 units of PRBCs on hold. Admit to the SDU, Will reverse coumadin with Vit K in anticipation of colonoscopy in am. (With [facility]).  #2 Atrial Fibrillation: hold coumadin, reverse INR with vit K. Depending on results of colonoscopy may need to decide on risk-benefit of coumadin. Patient would like his long-time cardiologist to be involved with this decision.  #3 DVT Prophylaxis: SCDs  Time Spent on Admission: 60 minutes  Ricardo [name],Ricardo [name] [facility] Pager: [phone] 04/17/2012, 12:58 PM

## 2012-04-18 ENCOUNTER — Encounter (HOSPITAL_COMMUNITY)
Admission: EM | Disposition: A | Discharge status: Home / Self Care | Payer: Self-pay | Source: Home / Self Care | Attending: Internal Medicine

## 2012-04-18 ENCOUNTER — Encounter (HOSPITAL_COMMUNITY): Payer: Self-pay | Admitting: Gastroenterology

## 2012-04-18 DIAGNOSIS — K5790 Diverticulosis of intestine, part unspecified, without perforation or abscess without bleeding: Secondary | ICD-10-CM

## 2012-04-18 DIAGNOSIS — D649 Anemia, unspecified: Secondary | ICD-10-CM | POA: Diagnosis present

## 2012-04-18 DIAGNOSIS — K5730 Diverticulosis of large intestine without perforation or abscess without bleeding: Secondary | ICD-10-CM

## 2012-04-18 HISTORY — PX: COLONOSCOPY: SHX5424

## 2012-04-18 LAB — BASIC METABOLIC PANEL
BUN: 11 mg/dL (ref 6–23)
BUN: 10 mg/dL (ref 6–23)
CO2: 27 mEq/L (ref 19–32)
CO2: 27 mEq/L (ref 19–32)
Chloride: 102 mEq/L (ref 96–112)
Chloride: 104 mEq/L (ref 96–112)
Creatinine, Ser: 0.96 mg/dL (ref 0.50–1.35)
GFR calc Af Amer: >90 mL/min (ref >90)
Glucose, Bld: 160 mg/dL — ABNORMAL HIGH (ref 70–99)
Potassium: 2.6 mEq/L — CL (ref 3.5–5.1)
Potassium: 2.9 mEq/L — ABNORMAL LOW (ref 3.5–5.1)

## 2012-04-18 LAB — CBC
HCT: 31.3 % — ABNORMAL LOW (ref 39.0–52.0)
Hemoglobin: 10.7 g/dL — ABNORMAL LOW (ref 13.0–17.0)
MCV: 86 fL (ref 78.0–100.0)
RBC: 3.64 MIL/uL — ABNORMAL LOW (ref 4.22–5.81)
WBC: 12 10*3/uL — ABNORMAL HIGH (ref 4.0–10.5)

## 2012-04-18 SURGERY — COLONOSCOPY
Anesthesia: Moderate Sedation

## 2012-04-18 MED ORDER — FENTANYL CITRATE 0.05 MG/ML IJ SOLN
INTRAMUSCULAR | Status: AC
  Filled 2012-04-18: qty 4

## 2012-04-18 MED ORDER — MIDAZOLAM HCL 5 MG/ML IJ SOLN
INTRAMUSCULAR | Status: AC
  Filled 2012-04-18: qty 2

## 2012-04-18 MED ORDER — POTASSIUM CHLORIDE 10 MEQ/100ML IV SOLN
10.0000 meq | INTRAVENOUS | Status: AC
  Administered 2012-04-18 (×4): 10 meq via INTRAVENOUS
  Filled 2012-04-18 (×5): qty 100

## 2012-04-18 MED ORDER — POTASSIUM CHLORIDE 20 MEQ/15ML (10%) PO LIQD
40.0000 meq | Freq: Once | ORAL | Status: AC
  Administered 2012-04-18: 40 meq via ORAL
  Filled 2012-04-18: qty 30

## 2012-04-18 MED ORDER — POTASSIUM CHLORIDE CRYS ER 20 MEQ PO TBCR
40.0000 meq | EXTENDED_RELEASE_TABLET | Freq: Once | ORAL | Status: AC
  Administered 2012-04-18: 40 meq via ORAL
  Filled 2012-04-18: qty 2

## 2012-04-18 MED ORDER — MIDAZOLAM HCL 5 MG/5ML IJ SOLN
INTRAMUSCULAR | Status: DC | PRN
  Administered 2012-04-18 (×3): 2.5 mg via INTRAVENOUS

## 2012-04-18 MED ORDER — FENTANYL CITRATE 0.05 MG/ML IJ SOLN
INTRAMUSCULAR | Status: DC | PRN
  Administered 2012-04-18 (×4): 25 ug via INTRAVENOUS

## 2012-04-18 NOTE — Interval H&P Note (Signed)
History and Physical Interval Note:  04/18/2012 8:15 AM  Ricardo [name]  has presented today for surgery, with the diagnosis of gi bleeding  The various methods of treatment have been discussed with the patient and family. After consideration of risks, benefits and other options for treatment, the patient has consented to  Procedure(s) (LRB) with comments: COLONOSCOPY (N/A) as a surgical intervention .  The patient's history has been reviewed, patient examined, no change in status, stable for surgery.  I have reviewed the patient's chart and labs.  Questions were answered to the patient's satisfaction.     Rob Bunting

## 2012-04-18 NOTE — Discharge Summary (Signed)
Physician Discharge Summary  Ricardo [name] Street ZOX:[id number] DOB: 1936/03/09 DOA: 04/17/2012  PCP: Ricardo Headings, MD  Admit date: 04/17/2012 Discharge date: 04/18/2012  Recommendations for Outpatient Follow-up:  1. Followup with Ricardo Headings, MD (PCP) in 1 week. 2. PT/INR to be checked on 9/12 or 04/27/2012. 3. Restart coumadin on 04/24/2012.  Discharge Diagnoses:  Principal Problem:  *GI bleed Active Problems:  Atrial fibrillation  Diverticulitis  Kidney stones  BPH (benign prostatic hyperplasia)  Chronic anticoagulation  Hematochezia  Diverticulosis   Discharge Condition: Stable.  Diet recommendation: Heart healthy diet  Filed Weights   04/17/12 1046  Weight: 84.5 kg (186 lb 4.6 oz)   History of present illness:  Ricardo [name] is a very pleasant 76 y/o man with h/o diverticulosis, atrial fibrillation on chronic anticoagulation with coumadin, who presented to the hospital with bright red blood per rectum on 04/17/2012.  Hospital Course:  GI Bleed/Hematochezia Painless in nature. Likely diverticular. Had colonscopy on 04/18/2012, which did not show any etiology for the bleed, moderate diverticulosis noted. Hb is currently stable, but did trend down. Anticoagulation reversed with Vitamin K. Per GI ok to DC home today after colonoscopy. Hold coumadin for 5 more days before restarting.  Anemia Likely due to acute blood loss from GI bleed and dilution. Hb stable, did not need blood transfusion at this time.  Hypokalemia Replace as needed. K 2.9, before patient received all of IV potassium and before oral potassium was given. Continue supplemental potassium at discharge.   Atrial Fibrillation Hold coumadin, reverse INR with vit K. Rate controlled.  Chronic anticoagulation Restart coumadin on 04/23/2012. To have PT/INR checked on 04/25/2012 and 04/26/2012.   Procedures:  As above.  Consultations:  Dr. Christella [name], GI  Discharge Exam: Filed Vitals:   04/18/12 1200  BP: 114/90    Pulse: 71  Temp: 97.5 F (36.4 C)  Resp: 17   Filed Vitals:   04/18/12 1045 04/18/12 1115 04/18/12 1130 04/18/12 1200  BP: 145/91 117/79 120/81 114/90  Pulse: 91 89 86 71  Temp:    97.5 F (36.4 C)  TempSrc:    Oral  Resp: 14 12 19 17   Height:      Weight:      SpO2: 94% 92% 96% 98%   Discharge Instructions  Discharge Orders    Future Appointments: Provider: Department: Dept Phone: Center:   05/16/2012 9:15 AM Ricardo Fee, MD [facility] [place] [facility] [phone] [facility]     Future Orders Please Complete By Expires   Diet - low sodium heart healthy      Increase activity slowly      Discharge instructions      Comments:   Please restart coumadin on 04/24/2012. Please followup with your anticoagulation clinic on 9/12 or 04/27/2012,.     Medication List  As of 04/18/2012 12:35 PM   STOP taking these medications         warfarin 3 MG tablet         TAKE these medications         allopurinol 300 MG tablet   Commonly known as: ZYLOPRIM   Take 300 mg by mouth daily.      b complex vitamins tablet   Take 1 tablet by mouth daily.      CALCIUM-MAGNESIUM-VITAMIN D PO   Take 1 tablet by mouth daily.      CO Q 10 PO   Take 1 tablet by mouth daily.      diltiazem 360 MG 24 hr capsule  Commonly known as: TIAZAC   Take 360 mg by mouth daily.      losartan 50 MG tablet   Commonly known as: COZAAR   Take 50 mg by mouth daily.      MAGNESIUM PO   Take 1 tablet by mouth daily.      potassium citrate 10 MEQ (1080 MG) SR tablet   Commonly known as: UROCIT-K   Take 20 mEq by mouth daily.      VITAMIN C PO   Take 1 tablet by mouth daily.              The results of significant diagnostics from this hospitalization (including imaging, microbiology, ancillary and laboratory) are listed below for reference.    Significant Diagnostic Studies: No results found.  Microbiology: Recent Results (from the past 240 hour(s))  MRSA PCR SCREENING     Status: Normal    Collection Time   04/17/12 10:53 AM      Component Value Range Status Comment   MRSA by PCR NEGATIVE  NEGATIVE Final      Labs: Basic Metabolic Panel:  Lab 04/18/12 8295 04/18/12 0500 04/17/12 0747  NA 141 142 141  K 2.9* 2.6* 4.5  CL 104 102 100  CO2 27 27 28   GLUCOSE 155* 160* 154*  BUN 10 11 16   CREATININE 0.96 0.97 1.09  CALCIUM 8.0* 8.5 9.2  MG 1.6 -- --  PHOS -- -- --   Liver Function Tests:  Lab 04/17/12 0747  AST 33  ALT 13  ALKPHOS 59  BILITOT 0.8  PROT 7.0  ALBUMIN 3.6   No results found for this basename: LIPASE:5,AMYLASE:5 in the last 168 hours No results found for this basename: AMMONIA:5 in the last 168 hours CBC:  Lab 04/18/12 1035 04/17/12 1737 04/17/12 1342 04/17/12 0747  WBC 12.0* 8.9 11.0* 12.9*  NEUTROABS -- -- -- 10.1*  HGB 10.7* 12.3* 12.1* 14.1  HCT 31.3* 35.7* 34.2* 40.2  MCV 86.0 86.0 86.4 86.3  PLT 194 189 210 203   Cardiac Enzymes: No results found for this basename: CKTOTAL:5,CKMB:5,CKMBINDEX:5,TROPONINI:5 in the last 168 hours BNP: BNP (last 3 results) No results found for this basename: PROBNP:3 in the last 8760 hours CBG: No results found for this basename: GLUCAP:5 in the last 168 hours  Time coordinating discharge: 35 minutes  Signed:  Jamire [name] A  [facility] 04/18/2012, 12:35 PM

## 2012-04-18 NOTE — Op Note (Signed)
[name] [address] [place], [address]   COLONOSCOPY PROCEDURE REPORT  PATIENT: Ricardo [name], Ricardo [name]  MR#: [id number] BIRTHDATE: 1935/10/04 , 76  yrs. old GENDER: Male ENDOSCOPIST: Rachael Fee, MD REFERRED BY:  [facility] PROCEDURE DATE:  04/18/2012 PROCEDURE:   Colonoscopy, diagnostic ASA CLASS:   Class III INDICATIONS:recent GI bleeding (on coumadin) long history of intermittent clinically diagnosed diveticulitis (never confirmed by imaging). MEDICATIONS: Fentanyl 100 mcg IV and Versed 7 mg IV  DESCRIPTION OF PROCEDURE:   After the risks benefits and alternatives of the procedure were thoroughly explained, informed consent was obtained.  A digital rectal exam revealed no abnormalities of the rectum.   The Pentax Colonoscope [id number] endoscope was introduced through the anus and advanced to the cecum, which was identified by both the appendix and ileocecal valve. No adverse events experienced.   The quality of the prep was good.  The instrument was then slowly withdrawn as the colon was fully examined.     COLON FINDINGS: Moderate diverticulosis was noted throughout the entire examined colon.  The colon was otherwise normal. Retroflexed views revealed no abnormalities.    .  The scope was withdrawn and the procedure completed. COMPLICATIONS: There were no complications.  ENDOSCOPIC IMPRESSION: Moderate diverticulosis was noted throughout the entire examined colon No polyps or cancers  RECOMMENDATIONS: Ok to d/c home today.  Should not restart coumadin for another 5 days. My office will call to set up return visit in 3-4 weeks.  If he has repeat "diverticulitis like pains" he knows to call my office.  I would proceed with CT scan abd/pelvis to try to confirm diverticulitis.   eSigned:  Rachael Fee, MD 04/18/2012 8:52 AM

## 2012-04-18 NOTE — Progress Notes (Signed)
CRITICAL VALUE ALERT  Critical value received:  K 2.6  Date of notification:  04/18/2012  Time of notification:  0715  Critical value read back:yes  Nurse who received alert:  Roselie Awkward, RN  MD notified (1st page):  Dr. Arbutus Leas  Time of first page:  0725  MD notified (2nd page):  Time of second page:  Responding MD:  Dr. Arbutus Leas  Time MD responded:  [phone]  Pt has already left for endoscopy.  Called endo and notified of K level and MD order for replacement.  Endo to page GI MD with results.

## 2012-04-18 NOTE — Progress Notes (Signed)
Subjective: Had colonoscopy today, denies any chest pain or shortness of breath. Still groggy from colonoscopy.  Objective: Vital signs in last 24 hours: Filed Vitals:   04/18/12 0920 04/18/12 0930 04/18/12 0940 04/18/12 1010  BP: 143/73 112/65 119/54   Pulse:      Temp:    98.1 F (36.7 C)  TempSrc:    Oral  Resp: 18 22 18    Height:      Weight:      SpO2: 97% 91% 93%    Weight change:   Intake/Output Summary (Last 24 hours) at 04/18/12 1132 Last data filed at 04/18/12 1010  Gross per 24 hour  Intake 4246.25 ml  Output    325 ml  Net 3921.25 ml    Physical Exam: General: Awake, Oriented, No acute distress. HEENT: EOMI. Neck: Supple CV: S1 and S2 Lungs: Clear to ascultation bilaterally Abdomen: Soft, Nontender, Nondistended, +bowel sounds. Ext: Good pulses. Trace edema.  Lab Results: Basic Metabolic Panel:  Lab 04/18/12 1610 04/18/12 0500 04/17/12 0747  NA 141 142 141  K 2.9* 2.6* 4.5  CL 104 102 100  CO2 27 27 28   GLUCOSE 155* 160* 154*  BUN 10 11 16   CREATININE 0.96 0.97 1.09  CALCIUM 8.0* 8.5 9.2  MG 1.6 -- --  PHOS -- -- --   Liver Function Tests:  Lab 04/17/12 0747  AST 33  ALT 13  ALKPHOS 59  BILITOT 0.8  PROT 7.0  ALBUMIN 3.6   No results found for this basename: LIPASE:5,AMYLASE:5 in the last 168 hours No results found for this basename: AMMONIA:5 in the last 168 hours CBC:  Lab 04/17/12 1737 04/17/12 1342 04/17/12 0747  WBC 8.9 11.0* 12.9*  NEUTROABS -- -- 10.1*  HGB 12.3* 12.1* 14.1  HCT 35.7* 34.2* 40.2  MCV 86.0 86.4 86.3  PLT 189 210 203   Cardiac Enzymes: No results found for this basename: CKTOTAL:5,CKMB:5,CKMBINDEX:5,TROPONINI:5 in the last 168 hours BNP (last 3 results) No results found for this basename: PROBNP:3 in the last 8760 hours CBG: No results found for this basename: GLUCAP:5 in the last 168 hours No results found for this basename: HGBA1C:5 in the last 72 hours Other Labs: No components found with this  basename: POCBNP:3 No results found for this basename: DDIMER:2 in the last 168 hours No results found for this basename: CHOL:2,HDL:2,LDLCALC:2,TRIG:2,CHOLHDL:2,LDLDIRECT:2 in the last 168 hours No results found for this basename: TSH,T4TOTAL,FREET3,T3FREE,FREET4,THYROIDAB in the last 168 hours No results found for this basename: VITAMINB12:2,FOLATE:2,FERRITIN:2,TIBC:2,IRON:2,RETICCTPCT:2 in the last 168 hours  Micro Results: Recent Results (from the past 240 hour(s))  MRSA PCR SCREENING     Status: Normal   Collection Time   04/17/12 10:53 AM      Component Value Range Status Comment   MRSA by PCR NEGATIVE  NEGATIVE Final     Studies/Results: No results found.  Medications: I have reviewed the patient's current medications. Scheduled Meds:   . sodium chloride   Intravenous STAT  . allopurinol  300 mg Oral Daily  . diltiazem  360 mg Oral Daily  . losartan  50 mg Oral Daily  . peg 3350 powder  1 kit Oral Once  . phytonadione (VITAMIN K) IV  5 mg Intravenous Once  . potassium chloride  10 mEq Intravenous Q1 Hr x 4  . potassium chloride  40 mEq Oral Once  . DISCONTD: diltiazem  360 mg Oral Daily  . DISCONTD: pantoprazole (PROTONIX) IV  40 mg Intravenous Q24H   Continuous Infusions:   .  sodium chloride 75 mL/hr at 04/17/12 1705  . DISCONTD: sodium chloride    . DISCONTD: sodium chloride 0.9 % 1,000 mL with potassium chloride 10 mEq infusion 50 mL/hr at 04/17/12 1300   PRN Meds:.acetaminophen, acetaminophen, morphine injection, ondansetron (ZOFRAN) IV, ondansetron, oxyCODONE, senna-docusate, DISCONTD: fentaNYL, DISCONTD: midazolam  Assessment/Plan: GI Bleed/Hematochezia Painless in nature. Likely diverticular. Had colonscopy on 04/18/2012, which did not show any etiology for the bleed, moderate diverticulosis noted. Hb is currently stable, but did trend down. Anticoagulation reversed with Vitamin K. Per GI ok to DC home today. Hold coumadin for 5 more days before  restarting.  Anemia Likely due to acute blood loss from GI bleed and dilution. Hb stable, does not need blood transfusion at this time.  Hypokalemia Replace as needed. K 2.9, before patient received all of IV potassium and before oral potassium was given. Continue supplemental potassium at discharge.   Atrial Fibrillation Hold coumadin, reverse INR with vit K. Rate controlled.  Chronic anticoagulation Restart coumadin on 04/23/2012. To have PT/INR checked on 04/25/2012 and 04/26/2012.    DVT Prophylaxis SCDs  Disposition Discharge the patients today.   LOS: 1 day  Jacque Byron A, MD 04/18/2012, 11:32 AM

## 2012-04-18 NOTE — Progress Notes (Signed)
Discharge instructions given to pt and spouse.  Both verbalized understanding with all questions answered.  Pt discharged home with wife.  Roselie Awkward, RN

## 2012-04-18 NOTE — Progress Notes (Signed)
Utilization review completed.  

## 2012-04-19 ENCOUNTER — Encounter (HOSPITAL_COMMUNITY): Payer: Self-pay

## 2012-04-19 ENCOUNTER — Encounter (HOSPITAL_COMMUNITY): Payer: Self-pay | Admitting: Gastroenterology

## 2012-04-19 LAB — TYPE AND SCREEN
ABO/RH(D): O POS
Antibody Screen: NEGATIVE
Unit division: 0
Unit division: 0

## 2012-05-02 ENCOUNTER — Telehealth: Payer: Self-pay | Admitting: Gastroenterology

## 2012-05-02 ENCOUNTER — Ambulatory Visit (INDEPENDENT_AMBULATORY_CARE_PROVIDER_SITE_OTHER)
Admission: RE | Admit: 2012-05-02 | Discharge: 2012-05-02 | Disposition: A | Discharge status: Home / Self Care | Payer: BC Managed Care – PPO | Source: Ambulatory Visit | Attending: Gastroenterology | Admitting: Gastroenterology

## 2012-05-02 DIAGNOSIS — R109 Unspecified abdominal pain: Secondary | ICD-10-CM

## 2012-05-02 MED ORDER — IOHEXOL 300 MG/ML  SOLN
100.0000 mL | Freq: Once | INTRAMUSCULAR | Status: AC | PRN
  Administered 2012-05-02: 100 mL via INTRAVENOUS

## 2012-05-02 NOTE — Telephone Encounter (Signed)
Great, thanks

## 2012-05-02 NOTE — Telephone Encounter (Signed)
Pt is having left side abd pain, per colon report Dr Christella Hartigan recommends CT to confirm diverticulitis if pain returns..  Pt is scheduled today for 330 pm at [facility]

## 2012-05-03 ENCOUNTER — Other Ambulatory Visit: Payer: Self-pay

## 2012-05-03 MED ORDER — AMOXICILLIN-POT CLAVULANATE 875-125 MG PO TABS: 1.0000 | ORAL_TABLET | Freq: Two times a day (BID) | ORAL | Status: DC

## 2012-05-16 ENCOUNTER — Encounter: Payer: Self-pay | Admitting: Gastroenterology

## 2012-05-16 ENCOUNTER — Ambulatory Visit (INDEPENDENT_AMBULATORY_CARE_PROVIDER_SITE_OTHER): Payer: BC Managed Care – PPO | Admitting: Gastroenterology

## 2012-05-16 ENCOUNTER — Ambulatory Visit: Payer: BC Managed Care – PPO | Admitting: Gastroenterology

## 2012-05-16 VITALS — BP 140/78 | HR 60 | Ht 68.0 in | Wt 190.4 lb

## 2012-05-16 DIAGNOSIS — R933 Abnormal findings on diagnostic imaging of other parts of digestive tract: Secondary | ICD-10-CM

## 2012-05-16 NOTE — Patient Instructions (Addendum)
Please call with repeated left sided abdominal pains. Please consider upper endoscopic ultrasound for areas in pancreatic tail, call Dr. Christella Hartigan office to let them know what you decide.

## 2012-05-16 NOTE — Progress Notes (Signed)
Review of pertinent gastrointestinal problems: 1. Moderate diverticulosis throughout entire colon (colonoscoyp Christella Hartigan 04/2012 during diverticular bleed).  2. Likely mild sigmoid diverticulitis, confirmed on CT Sept 2013, treated with augmentin 3. Hypodensities in panc tail, body noted on Ct sept 2013.   HPI: This is a   very pleasant 76 year old man whom I last saw his hospitalized for diverticular bleeding.  Restarted coumadin about 3 weeks ago.  Has noticed not bleeding at all.    Called back with left side pains, bleeding with it.  Itis on CT.  Augmentin given (he has had numerous episodes).  That CT scan noted incidental findings in the body, tail of his pancreas. These were felt to be probably cystic lesions. He has never had troubles with his pancreas, never significant alcohol drinker, he has not been losing weight.  Past Medical History  Diagnosis Date  . Diverticulitis     question if he ever had imaging confirmed diverticulitis  . Kidney stones     treated with lithotripsy   . Rupture of biceps tendon 2006    left distal bicep tendon  . BPH (benign prostatic hyperplasia)     by ST in 2000.   Marland Kitchen Atrial fibrillation     on coumadin.  cardiologist is in [facility] dr Duard Brady.  S/P multiple DCCVs and prior ablation.   . Gout     Past Surgical History  Procedure Date  . Cholecystectomy   . Hemorrhoid surgery     twice  . Distal biceps tendon repair 2006    dr Pecolia Ades  . Lithotripsy     of kidney stones.   . Colonoscopy 04/18/2012    Procedure: COLONOSCOPY;  Surgeon: Rachael Fee, MD;  Location: [facility] ENDOSCOPY;  Service: Endoscopy;  Laterality: N/A;    Current Outpatient Prescriptions  Medication Sig Dispense Refill  . allopurinol (ZYLOPRIM) 300 MG tablet Take 300 mg by mouth daily.      Marland Kitchen amoxicillin-clavulanate (AUGMENTIN) 875-125 MG per tablet Take 1 tablet by mouth 2 (two) times daily.  14 tablet  2  . Ascorbic Acid (VITAMIN C PO) Take 1 tablet by mouth  daily.      Marland Kitchen b complex vitamins tablet Take 1 tablet by mouth daily.      Marland Kitchen CALCIUM-MAGNESIUM-VITAMIN D PO Take 1 tablet by mouth daily.      . Coenzyme Q10 (CO Q 10 PO) Take 1 tablet by mouth daily.      Marland Kitchen diltiazem (TIAZAC) 360 MG 24 hr capsule Take 360 mg by mouth daily.      Marland Kitchen losartan (COZAAR) 50 MG tablet Take 50 mg by mouth daily.      Marland Kitchen MAGNESIUM PO Take 1 tablet by mouth daily.      . potassium citrate (UROCIT-K) 10 MEQ (1080 MG) SR tablet Take 20 mEq by mouth daily.        Allergies as of 05/16/2012 - Review Complete 05/16/2012  Allergen Reaction Noted  . Codeine  04/17/2012    No family history on file.  History   Social History  . Marital Status: Married    Spouse Name: N/A    Number of Children: N/A  . Years of Education: N/A   Occupational History  . Not on file.   Social History Main Topics  . Smoking status: Never Smoker   . Smokeless tobacco: Not on file  . Alcohol Use: No  . Drug Use: No  . Sexually Active:    Other Topics Concern  .  Not on file   Social History Narrative  . No narrative on file      Physical Exam: BP 140/78  Pulse 60  Ht 5\' 8"  (1.727 m)  Wt 190 lb 6.4 oz (86.365 kg)  BMI 28.95 kg/m2 Constitutional: generally well-appearing Psychiatric: alert and oriented x3 Abdomen: soft, nontender, nondistended, no obvious ascites, no peritoneal signs, normal bowel sounds     Assessment and plan: 76 y.o. male with likely recurrent left sided diverticulitis, incidental cystic lesions body, tail of pancreas  We had a long discussion about his colon and also a long discussion about his pancreas. He has had intermittent left-sided abdominal pains consistent with diverticulitis in the sigmoid colon on off for many years. Oral antibiotics has always helped him. He had radiographic findings earlier this month consistent with mild sigmoid diverticulitis as well. That is the only radiographic proof we have of his diverticulitis. During  colonoscopy last month I noticed diverticulum throughout his colon, not just limited to the left side. He will call if he has repeat left-sided abdominal pains and if we can establish a pattern and he is clearly having recurrent left sided diverticulitis and I would consider surgical referral. Terms of his pancreas this is an incidental finding and he is not losing weight. I think it is very unlikely that he has underlying pancreatic cancer. The area in in tail of pancreas is abnormal, likely cystic. I recommended further evaluation of this part of his pancreas with endoscopic ultrasound. Fine needle aspiration as well. He is going to consider this and will get back in touch with me.

## 2012-05-17 ENCOUNTER — Telehealth: Payer: Self-pay | Admitting: Gastroenterology

## 2012-05-17 NOTE — Telephone Encounter (Signed)
   [name], Please cut and past the following text into letter form with letterhead.  Print a copy and then call patient to let him know it is done, he will have someone pick it up at our office. Thanks   To whom it may concern:  I am a gastroenterologist in [facility].  I was consulted to see [name] after he was admitted to the hospital in early September for an overt GI bleed. He informed me that this admission was "not approved" by his insurance company.  It is not clear to me why that would be. He is a 76 year old man on blood thinners who was having active, acute GI bleeding. It is true that he was hemodynamically stable when he was admitted but that does not take away from the fact that he required admission. He underwent colonoscopy the day after he was admitted to find the source of his bleeding. Fortunately by that time his bleeding had stopped. I am happy to talk to whoever made this decision to not cover his inpatient admission. I have been a gastroenterologist for 10 years now and has never been the situation that an acute, active GI bleeding in elderly patients on blood thinners was not "covered by his insurance."   Respectfully,  Rob Bunting, MD

## 2012-05-17 NOTE — Telephone Encounter (Signed)
Letter printed and left at front desk for the pt

## 2012-10-29 ENCOUNTER — Other Ambulatory Visit (HOSPITAL_COMMUNITY): Payer: Self-pay | Admitting: Unknown Physician Specialty

## 2012-10-29 ENCOUNTER — Ambulatory Visit (HOSPITAL_COMMUNITY)
Admission: RE | Admit: 2012-10-29 | Discharge: 2012-10-29 | Disposition: A | Discharge status: Home / Self Care | Payer: BC Managed Care – PPO | Source: Ambulatory Visit | Attending: Orthopedic Surgery | Admitting: Orthopedic Surgery

## 2012-10-29 DIAGNOSIS — M7989 Other specified soft tissue disorders: Secondary | ICD-10-CM

## 2012-10-29 DIAGNOSIS — M25561 Pain in right knee: Secondary | ICD-10-CM

## 2012-10-29 NOTE — Progress Notes (Signed)
*  Preliminary Results* Right lower extremity venous duplex completed. Right lower extremity is negative for deep vein thrombosis. No evidence of right Baker's cyst. Incidental finding: There is a 2.8cm simple cystic structure of the mid medial right thigh.  Attempted to call preliminary results to Dr.Caffrey's office, however there was no answer. Will instruct patient to go home and if necessary he will be contacted by phone.  10/29/2012 5:10 PM Gertie Fey, RDMS, RDCS

## 2014-12-23 DIAGNOSIS — C4491 Basal cell carcinoma of skin, unspecified: Secondary | ICD-10-CM

## 2014-12-23 HISTORY — DX: Basal cell carcinoma of skin, unspecified: C44.91

## 2015-10-05 ENCOUNTER — Other Ambulatory Visit: Payer: Self-pay | Admitting: Urology

## 2015-10-30 NOTE — Progress Notes (Signed)
Attempted to call patient x2 for pre-litho phone call. Need to instruct him to come in for EKG early next week per litho truck requirements. Left message to return call.

## 2015-11-05 ENCOUNTER — Ambulatory Visit (HOSPITAL_COMMUNITY): Admission: RE | Admit: 2015-11-05 | Payer: BLUE CROSS/BLUE SHIELD | Source: Ambulatory Visit | Admitting: Urology

## 2015-11-05 ENCOUNTER — Encounter (HOSPITAL_COMMUNITY): Admission: RE | Payer: Self-pay | Source: Ambulatory Visit

## 2015-11-05 SURGERY — LITHOTRIPSY, ESWL
Anesthesia: LOCAL | Laterality: Left

## 2016-11-05 ENCOUNTER — Emergency Department (HOSPITAL_COMMUNITY): Payer: Medicare Other

## 2016-11-05 ENCOUNTER — Inpatient Hospital Stay (HOSPITAL_COMMUNITY)
Admission: EM | Admit: 2016-11-05 | Discharge: 2016-11-06 | DRG: 292 | Disposition: A | Discharge status: Home / Self Care | Payer: Medicare Other | Attending: Family Medicine | Admitting: Family Medicine

## 2016-11-05 ENCOUNTER — Encounter (HOSPITAL_COMMUNITY): Payer: Self-pay | Admitting: *Deleted

## 2016-11-05 DIAGNOSIS — I493 Ventricular premature depolarization: Secondary | ICD-10-CM | POA: Diagnosis present

## 2016-11-05 DIAGNOSIS — Z888 Allergy status to other drugs, medicaments and biological substances status: Secondary | ICD-10-CM

## 2016-11-05 DIAGNOSIS — Z7901 Long term (current) use of anticoagulants: Secondary | ICD-10-CM | POA: Diagnosis not present

## 2016-11-05 DIAGNOSIS — R7303 Prediabetes: Secondary | ICD-10-CM | POA: Diagnosis present

## 2016-11-05 DIAGNOSIS — J810 Acute pulmonary edema: Secondary | ICD-10-CM | POA: Diagnosis not present

## 2016-11-05 DIAGNOSIS — I4510 Unspecified right bundle-branch block: Secondary | ICD-10-CM | POA: Diagnosis present

## 2016-11-05 DIAGNOSIS — Q211 Atrial septal defect: Secondary | ICD-10-CM | POA: Diagnosis not present

## 2016-11-05 DIAGNOSIS — Z79899 Other long term (current) drug therapy: Secondary | ICD-10-CM

## 2016-11-05 DIAGNOSIS — Z7984 Long term (current) use of oral hypoglycemic drugs: Secondary | ICD-10-CM

## 2016-11-05 DIAGNOSIS — R609 Edema, unspecified: Secondary | ICD-10-CM

## 2016-11-05 DIAGNOSIS — R0601 Orthopnea: Secondary | ICD-10-CM

## 2016-11-05 DIAGNOSIS — I110 Hypertensive heart disease with heart failure: Principal | ICD-10-CM | POA: Diagnosis present

## 2016-11-05 DIAGNOSIS — I482 Chronic atrial fibrillation: Secondary | ICD-10-CM | POA: Diagnosis present

## 2016-11-05 DIAGNOSIS — I4891 Unspecified atrial fibrillation: Secondary | ICD-10-CM

## 2016-11-05 DIAGNOSIS — M109 Gout, unspecified: Secondary | ICD-10-CM | POA: Diagnosis present

## 2016-11-05 DIAGNOSIS — I509 Heart failure, unspecified: Secondary | ICD-10-CM

## 2016-11-05 DIAGNOSIS — N400 Enlarged prostate without lower urinary tract symptoms: Secondary | ICD-10-CM | POA: Diagnosis present

## 2016-11-05 DIAGNOSIS — I5023 Acute on chronic systolic (congestive) heart failure: Secondary | ICD-10-CM | POA: Diagnosis present

## 2016-11-05 LAB — GLUCOSE, CAPILLARY
GLUCOSE-CAPILLARY: 121 mg/dL — AB (ref 65–99)
GLUCOSE-CAPILLARY: 137 mg/dL — AB (ref 65–99)
Glucose-Capillary: 126 mg/dL — ABNORMAL HIGH (ref 65–99)
Glucose-Capillary: 119 mg/dL — ABNORMAL HIGH (ref 65–99)

## 2016-11-05 LAB — DIGOXIN LEVEL: Digoxin Level: 0.7 ng/mL — ABNORMAL LOW (ref 0.8–2.0)

## 2016-11-05 LAB — BASIC METABOLIC PANEL
Anion gap: 11 (ref 5–15)
BUN: 23 mg/dL — AB (ref 6–20)
CALCIUM: 8.9 mg/dL (ref 8.9–10.3)
CO2: 31 mmol/L (ref 22–32)
CREATININE: 1.36 mg/dL — AB (ref 0.61–1.24)
Chloride: 94 mmol/L — ABNORMAL LOW (ref 101–111)
GFR calc non Af Amer: 48 mL/min — ABNORMAL LOW (ref >60)
GFR, EST AFRICAN AMERICAN: 55 mL/min — AB (ref >60)
Glucose, Bld: 135 mg/dL — ABNORMAL HIGH (ref 65–99)
Potassium: 3.5 mmol/L (ref 3.5–5.1)
SODIUM: 136 mmol/L (ref 135–145)

## 2016-11-05 LAB — TROPONIN I
TROPONIN I: 0.09 ng/mL — AB (ref <0.03)
Troponin I: 0.08 ng/mL (ref <0.03)
Troponin I: 0.08 ng/mL (ref <0.03)
Troponin I: 0.08 ng/mL (ref <0.03)

## 2016-11-05 LAB — TSH: TSH: 1.956 u[IU]/mL (ref 0.350–4.500)

## 2016-11-05 LAB — PROTIME-INR
INR: 2.15
Prothrombin Time: 24.3 seconds — ABNORMAL HIGH (ref 11.4–15.2)

## 2016-11-05 LAB — CBC
HCT: 44.5 % (ref 39.0–52.0)
Hemoglobin: 14.8 g/dL (ref 13.0–17.0)
MCH: 29.2 pg (ref 26.0–34.0)
MCHC: 33.3 g/dL (ref 30.0–36.0)
MCV: 87.9 fL (ref 78.0–100.0)
PLATELETS: 231 10*3/uL (ref 150–400)
RBC: 5.06 MIL/uL (ref 4.22–5.81)
RDW: 15.5 % (ref 11.5–15.5)
WBC: 11.6 10*3/uL — ABNORMAL HIGH (ref 4.0–10.5)

## 2016-11-05 LAB — BRAIN NATRIURETIC PEPTIDE: B Natriuretic Peptide: 957.3 pg/mL — ABNORMAL HIGH (ref 0.0–100.0)

## 2016-11-05 MED ORDER — SODIUM CHLORIDE 0.9 % IV SOLN: 250.0000 mL | INTRAVENOUS | Status: DC | PRN

## 2016-11-05 MED ORDER — POTASSIUM CITRATE ER 10 MEQ (1080 MG) PO TBCR
20.0000 meq | EXTENDED_RELEASE_TABLET | Freq: Every day | ORAL | Status: DC
  Administered 2016-11-05 – 2016-11-06 (×2): 20 meq via ORAL
  Filled 2016-11-05 (×2): qty 2

## 2016-11-05 MED ORDER — ONDANSETRON HCL 4 MG/2ML IJ SOLN: 4.0000 mg | Freq: Four times a day (QID) | INTRAMUSCULAR | Status: DC | PRN

## 2016-11-05 MED ORDER — INSULIN ASPART 100 UNIT/ML ~~LOC~~ SOLN
0.0000 [IU] | Freq: Three times a day (TID) | SUBCUTANEOUS | Status: DC
  Administered 2016-11-05 – 2016-11-06 (×4): 1 [IU] via SUBCUTANEOUS

## 2016-11-05 MED ORDER — DOCUSATE SODIUM 100 MG PO CAPS
100.0000 mg | ORAL_CAPSULE | Freq: Every day | ORAL | Status: DC
  Administered 2016-11-05 – 2016-11-06 (×2): 100 mg via ORAL
  Filled 2016-11-05 (×2): qty 1

## 2016-11-05 MED ORDER — WARFARIN - PHARMACIST DOSING INPATIENT
Freq: Every day | Status: DC
  Administered 2016-11-05: 18:00:00

## 2016-11-05 MED ORDER — WARFARIN SODIUM 3 MG PO TABS: 3.0000 mg | ORAL_TABLET | Freq: Every day | ORAL | Status: DC

## 2016-11-05 MED ORDER — SODIUM CHLORIDE 0.9% FLUSH: 3.0000 mL | INTRAVENOUS | Status: DC | PRN

## 2016-11-05 MED ORDER — DILTIAZEM HCL ER COATED BEADS 180 MG PO CP24
180.0000 mg | ORAL_CAPSULE | Freq: Every day | ORAL | Status: DC
  Administered 2016-11-05 – 2016-11-06 (×2): 180 mg via ORAL
  Filled 2016-11-05 (×2): qty 1

## 2016-11-05 MED ORDER — SENNA 8.6 MG PO TABS
1.0000 | ORAL_TABLET | Freq: Every day | ORAL | Status: DC | PRN
  Administered 2016-11-05: 8.6 mg via ORAL
  Filled 2016-11-05: qty 1

## 2016-11-05 MED ORDER — LOSARTAN POTASSIUM 25 MG PO TABS
25.0000 mg | ORAL_TABLET | Freq: Every day | ORAL | Status: DC
  Administered 2016-11-05 – 2016-11-06 (×2): 25 mg via ORAL
  Filled 2016-11-05 (×2): qty 1

## 2016-11-05 MED ORDER — SODIUM CHLORIDE 0.9% FLUSH
3.0000 mL | Freq: Two times a day (BID) | INTRAVENOUS | Status: DC
  Administered 2016-11-05 (×2): 3 mL via INTRAVENOUS

## 2016-11-05 MED ORDER — WARFARIN SODIUM 3 MG PO TABS
3.0000 mg | ORAL_TABLET | Freq: Every day | ORAL | Status: DC
  Administered 2016-11-05: 3 mg via ORAL
  Filled 2016-11-05: qty 1

## 2016-11-05 MED ORDER — FUROSEMIDE 10 MG/ML IJ SOLN
40.0000 mg | Freq: Two times a day (BID) | INTRAMUSCULAR | Status: DC
  Administered 2016-11-05 – 2016-11-06 (×2): 40 mg via INTRAVENOUS
  Filled 2016-11-05 (×2): qty 4

## 2016-11-05 MED ORDER — FUROSEMIDE 10 MG/ML IJ SOLN
60.0000 mg | Freq: Once | INTRAMUSCULAR | Status: AC
  Administered 2016-11-05: 60 mg via INTRAVENOUS
  Filled 2016-11-05: qty 6

## 2016-11-05 MED ORDER — DIGOXIN 125 MCG PO TABS
0.1250 mg | ORAL_TABLET | Freq: Every day | ORAL | Status: DC
  Administered 2016-11-05 – 2016-11-06 (×2): 0.125 mg via ORAL
  Filled 2016-11-05 (×2): qty 1

## 2016-11-05 MED ORDER — ALLOPURINOL 300 MG PO TABS: 300.0000 mg | ORAL_TABLET | Freq: Every day | ORAL | Status: DC

## 2016-11-05 MED ORDER — ACETAMINOPHEN 325 MG PO TABS: 650.0000 mg | ORAL_TABLET | ORAL | Status: DC | PRN

## 2016-11-05 MED ORDER — DILTIAZEM HCL 25 MG/5ML IV SOLN
15.0000 mg | Freq: Once | INTRAVENOUS | Status: AC
  Administered 2016-11-05: 15 mg via INTRAVENOUS
  Filled 2016-11-05: qty 5

## 2016-11-05 NOTE — H&P (Signed)
History and Physical    Ricardo [name] EXB:[id number] DOB: December 08, 1935 DOA: 11/05/2016  PCP: Thressa Sheller, MD  Cardiology: Dr. Ola Spurr in [place]  Patient coming from: Home  Chief Complaint: Shortness of breath, orthopnea, swelling  HPI: Ricardo [name] is a 80 y.o. gentleman with a history of HTN, chronic atrial fibrillation (CHADS-Vasc score of 3, anticoagulated with warfarin), pre-diabetes, and kidney stones who has had ankle swelling since January.  He saw his cardiologist in [place] who prescribed lasix 20mg  to be used PRN.  In the past 2-3 weeks, he has developed progressive SOB at rest and DOE.  He has orthopnea (sleeps sitting up in a recliner most night), but no PND.  No chest pain.  No palpitations.  No light-headedness.  No LOC.  He saw his PCP on Monday, who increased his lasix to 40mg  daily.  Symptoms have not improved and ankle swelling is no better.  The patient presented to the ED for evaluation.  ED Course: Troponin 0.08.  INR 2.15.  BUN 23, Creatinine 1.36 (baseline is normal).  EKG shows atrial flutter with variable A-V block (unchanged from prior).  Chest xray shows stable cardiomegaly with mild diffuse pulmonary interstitial edema.  BNP 957.  The patient received IV lasix 60mg  x one and cardizem 15mg  IV x one.  Hospitalist asked to admit.    Review of Systems: As per HPI otherwise 10 point systems reviewed and negative.   Past Medical History:  Diagnosis Date  . Atrial fibrillation (HCC)    on coumadin.  cardiologist is in [facility] dr [name].  S/P multiple DCCVs and prior ablation.   Marland Kitchen BPH (benign prostatic hyperplasia)    by ST in 2000.   . Diverticulitis    question if he ever had imaging confirmed diverticulitis  . Gout   . Kidney stones    treated with lithotripsy   . Rupture of biceps tendon 2006   left distal bicep tendon    Past Surgical History:  Procedure Laterality Date  . CHOLECYSTECTOMY    . COLONOSCOPY  04/18/2012   Procedure: COLONOSCOPY;  Surgeon: [name], MD;  Location: [facility];  Service: Endoscopy;  Laterality: N/A;  . DISTAL BICEPS TENDON REPAIR  2006   dr [name]  . HEMORRHOID SURGERY     twice  . LITHOTRIPSY     of kidney stones.      reports that he has never smoked. He has never used smokeless tobacco. He reports that he does not drink alcohol or use drugs.  Allergies  Allergen Reactions  . Codeine Other (See Comments)    "Gives me headaches"    No family history on file.   Prior to Admission medications   Medication Sig Start Date End Date Taking? Authorizing Provider  allopurinol (ZYLOPRIM) 300 MG tablet Take 300 mg by mouth daily.   Yes Historical Provider, MD  digoxin (LANOXIN) 0.125 MG tablet Take 0.125 mg by mouth daily.   Yes Historical Provider, MD  diltiazem (CARDIZEM CD) 180 MG 24 hr capsule Take 180 mg by mouth daily.   Yes Historical Provider, MD  empagliflozin (JARDIANCE) 10 MG TABS tablet Take 10 mg by mouth daily.   Yes Historical Provider, MD  losartan (COZAAR) 25 MG tablet Take 25 mg by mouth daily.   Yes Historical Provider, MD  potassium citrate (UROCIT-K) 10 MEQ (1080 MG) SR tablet Take 20 mEq by mouth daily.   Yes Historical Provider, MD  warfarin (COUMADIN) 3 MG tablet Take 3  mg by mouth daily.   Yes Historical Provider, MD    Physical Exam: Vitals:   11/05/16 0415 11/05/16 0445 11/05/16 0530 11/05/16 0545  BP: (!) 151/103 (!) 146/103 134/84 (!) 140/93  Pulse: (!) 114 (!) 113 (!) 38 62  Resp: (!) 21 (!) 28 (!) 21 13  Temp:      TempSrc:      SpO2: 94% 95% 92% 95%      Constitutional: NAD, calm, comfortable, NONtoxic appearing Vitals:   11/05/16 0415 11/05/16 0445 11/05/16 0530 11/05/16 0545  BP: (!) 151/103 (!) 146/103 134/84 (!) 140/93  Pulse: (!) 114 (!) 113 (!) 38 62  Resp: (!) 21 (!) 28 (!) 21 13  Temp:      TempSrc:      SpO2: 94% 95% 92% 95%   Eyes: PERRL, lids and conjunctivae normal ENMT: Mucous membranes are moist.  Posterior pharynx clear of any exudate or lesions. Normal dentition.  Neck: normal appearance, supple, no masses, + JVD Respiratory: Bibasilar crackles.  No wheezing, no crackles. Normal respiratory effort. No accessory muscle use.  Cardiovascular: Irregularly irregular.  No murmurs / rubs / gallops.  2+ pitting edema at bilateral ankles.  No carotid bruits.  GI: abdomen is soft and compressible.  No distention.  No tenderness. Bowel sounds are present. Musculoskeletal:  No joint deformity in upper and lower extremities. Good ROM, no contractures. Normal muscle tone.  Skin: no rashes, warm and dry Neurologic: No apparent focal deficits. Psychiatric: Normal judgment and insight. Alert and oriented x 3. Normal mood.     Labs on Admission: I have personally reviewed following labs and imaging studies  CBC:  Recent Labs Lab 11/05/16 0200  WBC 11.6*  HGB 14.8  HCT 44.5  MCV 87.9  PLT 981   Basic Metabolic Panel:  Recent Labs Lab 11/05/16 0200  NA 136  K 3.5  CL 94*  CO2 31  GLUCOSE 135*  BUN 23*  CREATININE 1.36*  CALCIUM 8.9   GFR: CrCl cannot be calculated (Unknown ideal weight.).  Coagulation Profile:  Recent Labs Lab 11/05/16 0440  INR 2.15   Cardiac Enzymes:  Recent Labs Lab 11/05/16 0200  TROPONINI 0.08*    Radiological Exams on Admission: Dg Chest 2 View  Result Date: 11/05/2016 CLINICAL DATA:  Initial evaluation for acute shortness of breath. EXAM: CHEST  2 VIEW COMPARISON:  Prior radiograph from 08/06/2015. FINDINGS: Moderate cardiomegaly, stable. Mediastinal silhouette within normal limits. Aortic atherosclerosis. The Lungs normally inflated. Diffuse pulmonary vascular congestion with interstitial prominence, compatible pulmonary interstitial edema. No focal infiltrates. No pleural effusion. No pneumothorax. No acute osseus abnormality. Chronic compression deformity at the thoracolumbar junction noted, stable. IMPRESSION: 1. Stable cardiomegaly with  mild diffuse pulmonary interstitial edema. 2. Aortic atherosclerosis. Electronically Signed   By: Jeannine Boga M.D.   On: 11/05/2016 03:09    EKG: Independently reviewed. Noted above.  Assessment/Plan Principal Problem:   Acute CHF (congestive heart failure) (HCC) Active Problems:   Chronic anticoagulation   Atrial fibrillation with RVR (HCC)   Orthopnea   Edema      Signs and symptoms concerning for acute CHF --Cautious diuresis with IV lasix --Strict I/O --Daily weights --1500cc fluid restriction --Serial troponin --Complete echo  Atrial fibrillation with RVR, single dose of IV cardizem given in the ED, improvement seen. Should be able to avoid continuous infusion --Continue home doses of oral digoxin, cardizem --Telemetry monitoring --Warfarin per pharmacy  HTN --Cozaar  Pre-diabetes --Hold Jardiance, SSI coverage   DVT  prophylaxis: Anticoagulated with warfarin Code Status: FULL Family Communication: Wife present at bedside in the ED at time of admission. Disposition Plan: To be determined. Consults called: NONE Admission status: Place in observation with telemetry monitoring.   TIME SPENT: 60 minutes   Eber Jones MD [facility] Pager [phone]  If 7PM-7AM, please contact night-coverage [email or web address] Password [facility]  11/05/2016, 6:08 AM

## 2016-11-05 NOTE — ED Notes (Signed)
Troponin 0.08. Will be roomed next

## 2016-11-05 NOTE — Discharge Instructions (Signed)
Warfarin tablets What is this medicine? WARFARIN (WAR far in) is an anticoagulant. It is used to treat or prevent clots in the veins, arteries, lungs, or heart. This medicine may be used for other purposes; ask your health care provider or pharmacist if you have questions. COMMON BRAND NAME(S): Coumadin, Jantoven What should I tell my health care provider before I take this medicine? They need to know if you have any of these conditions: -alcoholism -anemia -bleeding disorders -cancer -diabetes -heart disease -high blood pressure -history of bleeding in the gastrointestinal tract -history of stroke or other brain injury or disease -kidney or liver disease -protein C deficiency -protein S deficiency -psychosis or dementia -recent injury, recent or planned surgery or procedure -an unusual or allergic reaction to warfarin, other medicines, foods, dyes, or preservatives -pregnant or trying to get pregnant -breast-feeding How should I use this medicine? Take this medicine by mouth with a glass of water. Follow the directions on the prescription label. You can take this medicine with or without food. Take your medicine at the same time each day. Do not take it more often than directed. Do not stop taking except on your doctor's advice. Stopping this medicine may increase your risk of a blood clot. Be sure to refill your prescription before you run out of medicine. If your doctor or healthcare professional calls to change your dose, write down the dose and any other instructions. Always read the dose and instructions back to him or her to make sure you understand them. Tell your doctor or healthcare professional what strength of tablets you have on hand. Ask how many tablets you should take to equal your new dose. Write the date on the new instructions and keep them near your medicine. If you are told to stop taking your medicine until your next blood test, call your doctor or healthcare  professional if you do not hear anything within 24 hours of the test to find out your new dose or when to restart your prior dose. A special MedGuide will be given to you by the pharmacist with each prescription and refill. Be sure to read this information carefully each time. Talk to your pediatrician regarding the use of this medicine in children. Special care may be needed. Overdosage: If you think you have taken too much of this medicine contact a poison control center or emergency room at once. NOTE: This medicine is only for you. Do not share this medicine with others. What if I miss a dose? It is important not to miss a dose. If you miss a dose, call your healthcare provider. Take the dose as soon as possible on the same day. If it is almost time for your next dose, take only that dose. Do not take double or extra doses to make up for a missed dose. What may interact with this medicine? Do not take this medicine with any of the following medications: -agents that prevent or dissolve blood clots -aspirin or other salicylates -danshen -dextrothyroxine -mifepristone -St. John's Wort -red yeast rice This medicine may also interact with the following medications: -acetaminophen -agents that lower cholesterol -alcohol -allopurinol -amiodarone -antibiotics or medicines for treating bacterial, fungal or viral infections -azathioprine -barbiturate medicines for inducing sleep or treating seizures -certain medicines for diabetes -certain medicines for heart rhythm problems -certain medicines for hepatitis C virus infections like daclatasvir, dasabuvir; ombitasvir; paritaprevir; ritonavir, elbasvir; grazoprevir, ledipasvir; sofosbuvir, simeprevir, sofosbuvir, sofosbuvir; velpatasvir, sofosbuvir; velpatasvir; voxilaprevir -certain medicines for high blood pressure -chloral hydrate -  cisapride -conivaptan -disulfiram -male hormones, including contraceptive or birth control pills -general  anesthetics -herbal or dietary products like garlic, ginkgo, ginseng, green tea, or kava kava -influenza virus vaccine -male hormones -medicines for mental depression or psychosis -medicines for some types of cancer -medicines for stomach problems -methylphenidate -NSAIDs, medicines for pain and inflammation, like ibuprofen or naproxen -propoxyphene -quinidine, quinine -raloxifene -seizure or epilepsy medicine like carbamazepine, phenytoin, and valproic acid -steroids like cortisone and prednisone -tamoxifen -thyroid medicine -tramadol -vitamin c, vitamin e, and vitamin K -zafirlukast -zileuton This list may not describe all possible interactions. Give your health care provider a list of all the medicines, herbs, non-prescription drugs, or dietary supplements you use. Also tell them if you smoke, drink alcohol, or use illegal drugs. Some items may interact with your medicine. What should I watch for while using this medicine? Visit your doctor or health care professional for regular checks on your progress. You will need to have a blood test called a PT/INR regularly. The PT/INR blood test is done to make sure you are getting the right dose of this medicine. It is important to not miss your appointment for the blood tests. When you first start taking this medicine, these tests are done often. Once the correct dose is determined and you take your medicine properly, these tests can be done less often. Notify your doctor or health care professional and seek emergency treatment if you develop breathing problems; changes in vision; chest pain; severe, sudden headache; pain, swelling, warmth in the leg; trouble speaking; sudden numbness or weakness of the face, arm or leg. These can be signs that your condition has gotten worse. While you are taking this medicine, carry an identification card with your name, the name and dose of medicine(s) being used, and the name and phone number of your doctor  or health care professional or person to contact in an emergency. Do not start taking or stop taking any medicines or over-the-counter medicines except on the advice of your doctor or health care professional. You should discuss your diet with your doctor or health care professional. Do not make major changes in your diet. Vitamin K can affect how well this medicine works. Many foods contain vitamin K. It is important to eat a consistent amount of foods with vitamin K. Other foods with vitamin K that you should eat in consistent amounts are asparagus, basil, black eyed peas, broccoli, brussel sprouts, cabbage, green onions, green tea, parsley, green leafy vegetables like beet greens, collard greens, kale, spinach, turnip greens, or certain lettuces like green leaf or romaine. This medicine can cause birth defects or bleeding in an unborn child. Women of childbearing age should use effective birth control while taking this medicine. If a woman becomes pregnant while taking this medicine, she should discuss the potential risks and her options with her health care professional. Avoid sports and activities that might cause injury while you are using this medicine. Severe falls or injuries can cause unseen bleeding. Be careful when using sharp tools or knives. Consider using an Copy. Take special care brushing or flossing your teeth. Report any injuries, bruising, or red spots on the skin to your doctor or health care professional. If you have an illness that causes vomiting, diarrhea, or fever for more than a few days, contact your doctor. Also check with your doctor if you are unable to eat for several days. These problems can change the effect of this medicine. Even after you stop taking this  medicine, it takes several days before your body recovers its normal ability to clot blood. Ask your doctor or health care professional how long you need to be careful. If you are going to have surgery or dental  work, tell your doctor or health care professional that you have been taking this medicine. °What side effects may I notice from receiving this medicine? °Side effects that you should report to your doctor or health care professional as soon as possible: °-allergic reactions like skin rash, itching or hives, swelling of the face, lips, or tongue °-breathing problems °-chest pain °-dizziness °-headache °-heavy menstrual bleeding or vaginal bleeding °-pain in the lower back or side °-painful, blue or purple toes °-painful skin ulcers that do not go away °-signs and symptoms of bleeding such as bloody or black, tarry stools; red or dark-brown urine; spitting up blood or brown material that looks like coffee grounds; red spots on the skin; unusual bruising or bleeding from the eye, gums, or nose °-stomach pain °-unusually weak or tired °Side effects that usually do not require medical attention (report to your doctor or health care professional if they continue or are bothersome): °-diarrhea °-hair loss °This list may not describe all possible side effects. Call your doctor for medical advice about side effects. You may report side effects to FDA at 1-800-FDA-1088. °Where should I keep my medicine? °Keep out of the reach of children. °Store at room temperature between 15 and 30 degrees C (59 and 86 degrees F). Protect from light. Throw away any unused medicine after the expiration date. Do not flush down the toilet. °NOTE: This sheet is a summary. It may not cover all possible information. If you have questions about this medicine, talk to your doctor, pharmacist, or health care provider. °© 2018 [employer] (2016-07-21 11:27:41) ° °

## 2016-11-05 NOTE — Progress Notes (Signed)
ANTICOAGULATION CONSULT NOTE - Initial Consult  Pharmacy Consult for Coumadin Indication: atrial fibrillation  Allergies  Allergen Reactions  . Codeine Other (See Comments)    "Gives me headaches"    Vital Signs: Temp: 98 F (36.7 C) (03/24 0153) Temp Source: Oral (03/24 0153) BP: 140/93 (03/24 0545) Pulse Rate: 62 (03/24 0545)  Labs:  Recent Labs  11/05/16 0200 11/05/16 0440  HGB 14.8  --   HCT 44.5  --   PLT 231  --   LABPROT  --  24.3*  INR  --  2.15  CREATININE 1.36*  --   TROPONINI 0.08*  --     CrCl cannot be calculated (Unknown ideal weight.).   Medical History: Past Medical History:  Diagnosis Date  . Atrial fibrillation (HCC)    on coumadin.  cardiologist is in [facility] dr [name].  S/P multiple DCCVs and prior ablation.   Marland Kitchen BPH (benign prostatic hyperplasia)    by ST in 2000.   . Diverticulitis    question if he ever had imaging confirmed diverticulitis  . Gout   . Kidney stones    treated with lithotripsy   . Rupture of biceps tendon 2006   left distal bicep tendon    Assessment: 81yo male c/o SOB and needing to sleep at incline, admitted for acute on chronic CHF, to continue Coumadin for Afib; current INR at goal w/ last dose of Coumadin taken 3/23.  Goal of Therapy:  INR 2-3   Plan:  Will continue home Coumadin dose of 3mg  daily and monitor INR.  Wynona Neat, PharmD, BCPS  11/05/2016,6:19 AM

## 2016-11-05 NOTE — ED Notes (Signed)
Admitting at bedside 

## 2016-11-05 NOTE — Progress Notes (Signed)
PROGRESS NOTE  Ricardo [name]  VQQ:[id number] DOB: [birth year] DOA: 11/05/2016 PCP: Thressa Sheller, MD  Outpatient Specialists: Cardiology, Dr. Ola Spurr  Brief Narrative: Ricardo [name] is a 81 y.o. male with a history of HTN, chronic AFib on warfarin, pre-diabetes, and kidney stones who presented to the ED for ankle swelling, orthopnea, and recently progressive dyspnea. He saw his cardiologist in [place] who prescribed lasix 20mg  to be used PRN.  In the past 2-3 weeks, he has developed progressive SOB at rest and DOE with increasing orthopnea (sleeps sitting up in a recliner most nights). He saw his PCP on Monday who advised 40mg  daily for 3 days. He was also reevaluated at his cardiologist's office earlier this week with no change in therapy recommended.   On arrival HR was 114 and irregular, BP 157/106. Troponin 0.08. INR 2.15. BUN 23, Creatinine 1.36 (baseline is normal). ECG showed atrial flutter with variable A-V block (unchanged from prior). CXR showed stable cardiomegaly with mild diffuse pulmonary interstitial edema. BNP 957. The patient received IV lasix 60mg  x1 and cardizem 15mg  IV x1, admitted for acute pulmonary edema.   Assessment & Plan: Principal Problem:   Acute CHF (congestive heart failure) (HCC) Active Problems:   Chronic anticoagulation   Atrial fibrillation with RVR (HCC)   Orthopnea   Edema  Acute pulmonary edema: Suspect CHF. - Troponin flat 0.08 > 0.08, no chest pain. ECG with RBBB (seen prior) and LAFB read as AFlutter with variable AV conduction, though it seems regular rhythm to me with PVC and PAC. Not significantly concerned for ACS at this time. - Checking echocardiogram, plan to consult cardiology if significant abnormalities. - Continue lasix 40mg  IV BID, urine output is significant.  - Stirct I/O, daily weight, monitor renal function  - Fluid restriction  Atrial fibrillation with RVR: Resolved after single IV cardizem and restarting home  medications. TSH 1.956. - Continue cardizem and digoxin (level 0.7) - Continue telemetry - Coumadin per pharmacy, CAHDSVASc 3.  HTN: Chronic, stable - Continue cozaar  Pre-diabetes: No HbA1c on file. - Hold jardiance - SSI coverage  DVT prophylaxis: Coumadin Code Status: Full Family Communication: Wife at bedside Disposition Plan: [place] home once euvolemic.   Consultants:   None  Procedures:   Echo ordered  Antimicrobials:  None   Subjective: Pt breathing better, not at baseline, swelling improved. Still significantly orthopneic. No chest pain, palpitations, can't tell when he's in/out of afib.   Objective: Vitals:   11/05/16 0545 11/05/16 0615 11/05/16 0639 11/05/16 0951  BP: (!) 140/93 138/89 (!) 149/90 (!) 129/92  Pulse: 62 (!) 114 (!) 116 (!) 112  Resp: 13 (!) 25 (!) 22   Temp:   98.2 F (36.8 C)   TempSrc:   Oral   SpO2: 95% 92% 97% 97%  Weight:   69.9 kg (154 lb 3.2 oz)   Height:   5\' 6"  (1.676 m)     Intake/Output Summary (Last 24 hours) at 11/05/16 1458 Last data filed at 11/05/16 1302  Gross per 24 hour  Intake              240 ml  Output             2800 ml  Net            -2560 ml   Filed Weights   11/05/16 0639  Weight: 69.9 kg (154 lb 3.2 oz)   Examination: General exam: Pleasant elderly male in no distress Respiratory system: Non-labored breathing room  air. Clear to auscultation bilaterally.  Cardiovascular system: Irreg irreg. No murmur, rub, or gallop. No JVD, and 2+ pitting pedal edema. Gastrointestinal system: Abdomen soft, non-tender, non-distended, with normoactive bowel sounds. No organomegaly or masses felt. Central nervous system: Alert and oriented. No focal neurological deficits. Extremities: Warm, no deformities Skin: No rashes, lesions no ulcers Psychiatry: Judgement and insight appear normal. Mood & affect appropriate.   Basic Metabolic Panel:  Recent Labs Lab 11/05/16 0200  NA 136  K 3.5  CL 94*  CO2 31    GLUCOSE 135*  BUN 23*  CREATININE 1.36*  CALCIUM 8.9   Coagulation Profile:  Recent Labs Lab 11/05/16 0440  INR 2.15   Cardiac Enzymes:  Recent Labs Lab 11/05/16 0200 11/05/16 0739 11/05/16 1040  TROPONINI 0.08* 0.09* 0.08*    Recent Labs  11/05/16 0739  TSH 1.956   Radiology Studies: Dg Chest 2 View. 11/05/2016: Stable cardiomegaly with mild diffuse pulmonary interstitial edema.  Ricardo Gather, MD [facility] Pager [phone]  If 7PM-7AM, please contact night-coverage [email or web address] Password [facility] 11/05/2016, 2:58 PM

## 2016-11-05 NOTE — Progress Notes (Signed)
Patient without complaint on 7 a to 7 p shift.  VSS, heart rate started in 110's at beginning of shift but after patient received his medications HR in the 70's and 80's.  Oxygen saturation remains in the 90's on room air, patient states shortness of breath is improved.  Patient is independent in room, diuresing well.  Wife at bedside most of the day.

## 2016-11-05 NOTE — Plan of Care (Signed)
Problem: Activity: Goal: Risk for activity intolerance will decrease Outcome: Progressing Patient reports less shortness of breath than prior to admit

## 2016-11-05 NOTE — ED Provider Notes (Signed)
[facility] DEPT Provider Note   CSN: [id number] Arrival date & time: 11/05/16  0149  By signing my name below, I, Arianna Nassar, attest that this documentation has been prepared under the direction and in the presence of Jola Schmidt, MD.  Electronically Signed: Julien Nordmann, ED Scribe. 11/05/16. 3:06 AM.    History   Chief Complaint Chief Complaint  Patient presents with  . Shortness of Breath   The history is provided by the patient. No language interpreter was used.   HPI Comments: Ricardo [name] is a 81 y.o. male who has a PMhx of a-fib, BPH, kidney stones, and chronic anticoagulation presents to the Emergency Department complaining of Increasing shortness of breath over the past 3 weeks.  No prior history of congestive heart failure.  He has a history of chronic A. fib.  He is on Coumadin and digoxin.  His been compliant with his medications.  He was recent started on 20 mg daily of Lasix by his primary care doctor and this was increased by his cardiologist.  He thinks he may have had 2 or 3 pounds of weight gain.  He's had some lower [place] swelling.  He's had to sleep in his recliner over the past 3 weeks and his wife reports that he short of breath with walking.  Presents with a heart rate of 116 in atrial fibrillation with rapid ventricular response.  Past Medical History:  Diagnosis Date  . Atrial fibrillation (HCC)    on coumadin.  cardiologist is in [facility] dr [name].  S/P multiple DCCVs and prior ablation.   Marland Kitchen BPH (benign prostatic hyperplasia)    by ST in 2000.   . Diverticulitis    question if he ever had imaging confirmed diverticulitis  . Gout   . Kidney stones    treated with lithotripsy   . Rupture of biceps tendon 2006   left distal bicep tendon    Patient Active Problem List   Diagnosis Date Noted  . Diverticulosis 04/18/2012  . Chronic anticoagulation 04/17/2012  . Atrial fibrillation ([place])   . Diverticulitis   . Kidney stones     . BPH (benign prostatic hyperplasia)     Past Surgical History:  Procedure Laterality Date  . CHOLECYSTECTOMY    . COLONOSCOPY  04/18/2012   Procedure: COLONOSCOPY;  Surgeon: [name], MD;  Location: [facility];  Service: Endoscopy;  Laterality: N/A;  . DISTAL BICEPS TENDON REPAIR  2006   dr [name]  . HEMORRHOID SURGERY     twice  . LITHOTRIPSY     of kidney stones.        Home Medications    Prior to Admission medications   Medication Sig Start Date End Date Taking? Authorizing Provider  allopurinol (ZYLOPRIM) 300 MG tablet Take 300 mg by mouth daily.    Historical Provider, MD  amoxicillin-clavulanate (AUGMENTIN) 875-125 MG per tablet Take 1 tablet by mouth 2 (two) times daily. 05/03/12   [name], MD  Ascorbic Acid (VITAMIN C PO) Take 1 tablet by mouth daily.    Historical Provider, MD  b complex vitamins tablet Take 1 tablet by mouth daily.    Historical Provider, MD  CALCIUM-MAGNESIUM-VITAMIN D PO Take 1 tablet by mouth daily.    Historical Provider, MD  Coenzyme Q10 (CO Q 10 PO) Take 1 tablet by mouth daily.    Historical Provider, MD  diltiazem (TIAZAC) 360 MG 24 hr capsule Take 360 mg by mouth daily.  Historical Provider, MD  losartan (COZAAR) 50 MG tablet Take 50 mg by mouth daily.    Historical Provider, MD  MAGNESIUM PO Take 1 tablet by mouth daily.    Historical Provider, MD  potassium citrate (UROCIT-K) 10 MEQ (1080 MG) SR tablet Take 20 mEq by mouth daily.    Historical Provider, MD    Family History No family history on file.  Social History Social History  Substance Use Topics  . Smoking status: Never Smoker  . Smokeless tobacco: Never Used  . Alcohol use No     Allergies   Codeine   Review of Systems Review of Systems  All other systems reviewed and are negative.   A complete 10 system review of systems was obtained and all systems are negative except as noted in the HPI and PMH.   Physical Exam Updated Vital Signs BP (!)  157/106 (BP Location: Left Arm)   Pulse (!) 114   Temp 98 F (36.7 C) (Oral)   Resp 16   SpO2 99%   Physical Exam  Constitutional: He is oriented to person, place, and time. He appears well-developed and well-nourished.  HENT:  Head: Normocephalic and atraumatic.  Eyes: EOM are normal.  Neck: Normal range of motion.  Cardiovascular:  Irregularly irregular.  Tachycardic.  Pulmonary/Chest: Effort normal. No respiratory distress. He has rales.  Abdominal: Soft. He exhibits no distension. There is no tenderness.  Musculoskeletal: Normal range of motion. He exhibits edema.  Neurological: He is alert and oriented to person, place, and time.  Skin: Skin is warm and dry.  Psychiatric: He has a normal mood and affect. Judgment normal.  Nursing note and vitals reviewed.    ED Treatments / Results  DIAGNOSTIC STUDIES: Oxygen Saturation is 99% on RA, normal by my interpretation.  COORDINATION OF CARE:    Labs (all labs ordered are listed, but only abnormal results are displayed) Labs Reviewed  BASIC METABOLIC PANEL - Abnormal; Notable for the following:       Result Value   Chloride 94 (*)    Glucose, Bld 135 (*)    BUN 23 (*)    Creatinine, Ser 1.36 (*)    GFR calc non Af Amer 48 (*)    GFR calc Af Amer 55 (*)    All other components within normal limits  CBC - Abnormal; Notable for the following:    WBC 11.6 (*)    All other components within normal limits  TROPONIN I - Abnormal; Notable for the following:    Troponin I 0.08 (*)    All other components within normal limits    EKG  EKG Interpretation  Date/Time:  Saturday November 05 2016 01:55:50 EDT Ventricular Rate:  116 PR Interval:    QRS Duration: 138 QT Interval:  354 QTC Calculation: 492 R Axis:   -75 Text Interpretation:  Atrial flutter with variable A-V block with premature ventricular or aberrantly conducted complexes Right bundle branch block Left anterior fascicular block ** Bifascicular block ** Septal  infarct , age undetermined Abnormal ECG No significant change was found Confirmed by Stuti Sandin  MD, Lennette Bihari ([id number]) on 11/05/2016 3:03:27 AM       Radiology Dg Chest 2 View  Result Date: 11/05/2016 CLINICAL DATA:  Initial evaluation for acute shortness of breath. EXAM: CHEST  2 VIEW COMPARISON:  Prior radiograph from 08/06/2015. FINDINGS: Moderate cardiomegaly, stable. Mediastinal silhouette within normal limits. Aortic atherosclerosis. The Lungs normally inflated. Diffuse pulmonary vascular congestion with interstitial prominence, compatible pulmonary  interstitial edema. No focal infiltrates. No pleural effusion. No pneumothorax. No acute osseus abnormality. Chronic compression deformity at the thoracolumbar junction noted, stable. IMPRESSION: 1. Stable cardiomegaly with mild diffuse pulmonary interstitial edema. 2. Aortic atherosclerosis. Electronically Signed   By: Jeannine Boga M.D.   On: 11/05/2016 03:09    Procedures Procedures (including critical care time)  Medications Ordered in ED Medications  diltiazem (CARDIZEM) injection 15 mg (15 mg Intravenous Given 11/05/16 0502)  furosemide (LASIX) injection 60 mg (60 mg Intravenous Given 11/05/16 0458)     Initial Impression / Assessment and Plan / ED Course  I have reviewed the triage vital signs and the nursing notes.  Pertinent labs & imaging results that were available during my care of the patient were reviewed by me and considered in my medical decision making (see chart for details).     IV Lasix now.  Patient be admitted for likely acute congestive heart failure with failed outpatient regimen.  Rapid A. fib treated with IV Cardizem.  INR and digoxin level pending.  Final Clinical Impressions(s) / ED Diagnoses   Final diagnoses:  Acute on chronic congestive heart failure, unspecified congestive heart failure type ([place])  Atrial fibrillation with rapid ventricular response ([place])   I personally performed the services described  in this documentation, which was scribed in my presence. The recorded information has been reviewed and is accurate.     New Prescriptions New Prescriptions   No medications on file     Jola Schmidt, MD 11/05/16 [phone]

## 2016-11-05 NOTE — ED Triage Notes (Signed)
The pt has been sob for 2 weeks and he has been sleeping in a recliner.  No difference he just got tired of not sleeping  No home 02  No sob at present

## 2016-11-06 ENCOUNTER — Inpatient Hospital Stay (HOSPITAL_COMMUNITY): Payer: Medicare Other

## 2016-11-06 DIAGNOSIS — I5023 Acute on chronic systolic (congestive) heart failure: Secondary | ICD-10-CM

## 2016-11-06 DIAGNOSIS — I509 Heart failure, unspecified: Secondary | ICD-10-CM

## 2016-11-06 DIAGNOSIS — J810 Acute pulmonary edema: Secondary | ICD-10-CM

## 2016-11-06 LAB — BASIC METABOLIC PANEL
Anion gap: 14 (ref 5–15)
BUN: 17 mg/dL (ref 6–20)
CALCIUM: 8.6 mg/dL — AB (ref 8.9–10.3)
CHLORIDE: 95 mmol/L — AB (ref 101–111)
CO2: 31 mmol/L (ref 22–32)
CREATININE: 1.21 mg/dL (ref 0.61–1.24)
GFR calc Af Amer: >60 mL/min (ref >60)
GFR calc non Af Amer: 55 mL/min — ABNORMAL LOW (ref >60)
GLUCOSE: 106 mg/dL — AB (ref 65–99)
Potassium: 2.9 mmol/L — ABNORMAL LOW (ref 3.5–5.1)
Sodium: 140 mmol/L (ref 135–145)

## 2016-11-06 LAB — ECHOCARDIOGRAM COMPLETE
Height: 66 in
Weight: 2323.2 oz

## 2016-11-06 LAB — PROTIME-INR
INR: 2.07
Prothrombin Time: 23.6 seconds — ABNORMAL HIGH (ref 11.4–15.2)

## 2016-11-06 LAB — GLUCOSE, CAPILLARY
GLUCOSE-CAPILLARY: 114 mg/dL — AB (ref 65–99)
GLUCOSE-CAPILLARY: 127 mg/dL — AB (ref 65–99)

## 2016-11-06 MED ORDER — FUROSEMIDE 40 MG PO TABS: 40.0000 mg | ORAL_TABLET | Freq: Every day | ORAL | 0 refills | Status: DC

## 2016-11-06 MED ORDER — METOPROLOL SUCCINATE ER 25 MG PO TB24
25.0000 mg | ORAL_TABLET | Freq: Every day | ORAL | Status: DC
  Administered 2016-11-06: 25 mg via ORAL
  Filled 2016-11-06: qty 1

## 2016-11-06 MED ORDER — METOPROLOL SUCCINATE ER 25 MG PO TB24: 25.0000 mg | ORAL_TABLET | Freq: Every day | ORAL | 0 refills | Status: DC

## 2016-11-06 MED ORDER — POTASSIUM CHLORIDE CRYS ER 20 MEQ PO TBCR
40.0000 meq | EXTENDED_RELEASE_TABLET | Freq: Four times a day (QID) | ORAL | Status: AC
  Administered 2016-11-06 (×2): 40 meq via ORAL
  Filled 2016-11-06 (×2): qty 2

## 2016-11-06 NOTE — Plan of Care (Signed)
Problem: Education: Goal: Ability to demonstrate managment of disease process will improve Outcome: Completed/Met Date Met: 11/06/16 Discussed daily weights, watching sodium content of foods, when to notify provider, patient verbalized understanding

## 2016-11-06 NOTE — Progress Notes (Signed)
ANTICOAGULATION CONSULT NOTE - Initial Consult  Pharmacy Consult for Coumadin Indication: atrial fibrillation  Allergies  Allergen Reactions  . Codeine Other (See Comments)    "Gives me headaches"    Vital Signs: Temp: 98 F (36.7 C) (03/25 0525) Temp Source: Oral (03/25 0525) BP: 141/84 (03/25 0525) Pulse Rate: 79 (03/25 0525)  Labs:  Recent Labs  11/05/16 0200 11/05/16 0440 11/05/16 0739 11/05/16 1040 11/05/16 1425 11/06/16 0503  HGB 14.8  --   --   --   --   --   HCT 44.5  --   --   --   --   --   PLT 231  --   --   --   --   --   LABPROT  --  24.3*  --   --   --  23.6*  INR  --  2.15  --   --   --  2.07  CREATININE 1.36*  --   --   --   --  1.21  TROPONINI 0.08*  --  0.09* 0.08* 0.08*  --     Estimated Creatinine Clearance: 44.7 mL/min (by C-G formula based on SCr of 1.21 mg/dL).   Medical History: Past Medical History:  Diagnosis Date  . Atrial fibrillation (HCC)    on coumadin.  cardiologist is in [facility] dr [name].  S/P multiple DCCVs and prior ablation.   Marland Kitchen BPH (benign prostatic hyperplasia)    by ST in 2000.   . Diverticulitis    question if he ever had imaging confirmed diverticulitis  . Gout   . Kidney stones    treated with lithotripsy   . Rupture of biceps tendon 2006   left distal bicep tendon    Assessment: 81yo male c/o SOB and needing to sleep at incline, admitted for acute on chronic CHF, to continue Coumadin for Afib; admit INR at goal w/ last dose of Coumadin taken 3/23. INR remains at goal today at 2.07. CBC WNL.  -PTA dose: 3mg  daily  Goal of Therapy:  INR 2-3   Plan:  Will continue home Coumadin dose of 3mg  daily and monitor INR daily  Myer Peer Grayland Ormond), PharmD  PGY1 Pharmacy Resident Pager: [phone] 11/06/2016 9:16 AM

## 2016-11-06 NOTE — Progress Notes (Signed)
Dr Meda Coffee requests new patient office appt. I have sent a message to our [facility] office's scheduler requesting a follow-up appointment, and our office will call the patient with this information.  [name] PA-C

## 2016-11-06 NOTE — Progress Notes (Signed)
Discharge instructions reviewed with patient and son, went over all medications, dosages and when they were next due.  Questions answered, verbalized understanding.  Patient transported via wheelchair to front of hospital to be taken home by son.

## 2016-11-06 NOTE — Plan of Care (Signed)
Problem: Activity: Goal: Capacity to carry out activities will improve Outcome: Progressing Patient reports less SOB with activity and at rest. Patient ambulates to the bathroom independently. Sats remains in the 90's on RA

## 2016-11-06 NOTE — Discharge Summary (Signed)
Physician Discharge Summary  Ricardo [name] QBH:[id number] DOB: 1936/05/26 DOA: 11/05/2016  PCP: Thressa Sheller, MD  Admit date: 11/05/2016 Discharge date: 11/06/2016  Admitted From: Home Disposition: Home   Recommendations for Outpatient Follow-up:  1. Follow up with PCP in 1-2 weeks 2. Pt interested in switching to [facility] for cardiology care. I have contacted them and they will contact the patient on 3/26 to arrange follow up. 3. Cardizem stopped and metoprolol started to continue AFib management in the setting of reduced EF.  4. Please obtain BMP to monitor renal function and potassium.   Home Health: None Equipment/Devices: None Discharge Condition: Stable CODE STATUS: Full Diet recommendation: Heart healthy  Brief/Interim Summary: Ricardo [name] a 81 y.o.male with a history of HTN, chronic AFib on warfarin, pre-diabetes, and kidney stones who presented to the ED for ankle swelling, orthopnea, and recently progressive dyspnea. He saw his cardiologist in [place] who prescribed lasix 20mg  to be used PRN. In the past 2-3 weeks, he has developed progressive SOB at rest and DOE with increasing orthopnea (sleeps sitting up in a recliner most nights). He saw his PCP on Monday who advised 40mg  daily for 3 days. He was also reevaluated at his cardiologist's office earlier this week with no change in therapy recommended.   On arrival HR was 114 and irregular, BP 157/106. Troponin 0.08.INR 2.15. BUN 23, Creatinine 1.36 (baseline is normal).ECG showed atrial flutter with variable A-V block (unchanged from prior). CXR showed stable cardiomegaly with mild diffuse pulmonary interstitial edema. BNP 957. The patient received IV lasix 60mg  x1 and cardizem 15mg  IV x1, admitted for acute pulmonary edema.   Discharge Diagnoses:  Principal Problem:   Acute on chronic congestive heart failure (HCC) Active Problems:   Chronic anticoagulation   Atrial fibrillation with rapid  ventricular response (HCC)   Orthopnea   Edema   Acute pulmonary edema (HCC)  Acute on chronic systolic CHF: ECHO showed severe LVH, EF 40% with restrictive physiology, high ventricular filling pressure. No regional wall motion abnormalities. Troponin flat 0.08 > 0.08, no chest pain. ECG similar to prior with RBBB. Not significantly concerned for ACS at this time. - Desires to follow up with [facility], will be contacted to schedule this.  - Continue lasix 40mg  daily with 29mEq daily potassium at discharge, follow up.  - Start metoprolol succinate, on ARB and digoxin.  - Stopping diltiazem in light of depressed EF.  - Weight at discharge 145lbs (down from 154lbs), though EDW is likely lower.   Atrial fibrillation with RVR: Resolved after single IV cardizem and restarting home medications. TSH 1.956. - Continue digoxin (level 0.7) - DC dilt, starting metop - Continue coumadin, CHADSVASc 3.  HTN: Chronic, stable - Continue cozaar  Pre-diabetes: No HbA1c on file. - Hold jardiance - SSI coverage  Discharge Instructions Discharge Instructions    (HEART FAILURE PATIENTS) Call MD:  Anytime you have any of the following symptoms: 1) 3 pound weight gain in 24 hours or 5 pounds in 1 week 2) shortness of breath, with or without a dry hacking cough 3) swelling in the hands, feet or stomach 4) if you have to sleep on extra pillows at night in order to breathe.    Complete by:  As directed    Diet - low sodium heart healthy    Complete by:  As directed    Discharge instructions    Complete by:  As directed    You were admitted for acute systolic heart failure causing volume  overload. This has been treated with diuretics (lasix) which you will continue at discharge. Your echocardiogram showed a reduced ejection fraction of 40% (normal is 50%). This was discussed with Dr. Meda Coffee, the cardiologist on call, who does not believe an inpatient consultation is necessary. You will be contacted tomorrow to  schedule a follow up appointment. You are stable for discharge with the following changes:  - START taking lasix 40mg  once daily - START taking metoprolol 25mg  daily - STOP taking diltiazem - Continue taking 79mEq of potassium daily - You need to follow up with your primary doctor in the next week to recheck your potassium and kidney function.  - Seek medical attention sooner if you have worsening shortness of breath, chest pain, palpitations, leg swelling, or extreme weakness.     Allergies as of 11/06/2016      Reactions   Codeine Other (See Comments)   "Gives me headaches"      Medication List    STOP taking these medications   diltiazem 180 MG 24 hr capsule Commonly known as:  CARDIZEM CD     TAKE these medications   allopurinol 300 MG tablet Commonly known as:  ZYLOPRIM Take 300 mg by mouth daily.   digoxin 0.125 MG tablet Commonly known as:  LANOXIN Take 0.125 mg by mouth daily.   JARDIANCE 10 MG Tabs tablet Generic drug:  empagliflozin Take 10 mg by mouth daily.   losartan 25 MG tablet Commonly known as:  COZAAR Take 25 mg by mouth daily.   metoprolol succinate 25 MG 24 hr tablet Commonly known as:  TOPROL-XL Take 1 tablet (25 mg total) by mouth daily.   potassium citrate 10 MEQ (1080 MG) SR tablet Commonly known as:  UROCIT-K Take 20 mEq by mouth daily.   warfarin 3 MG tablet Commonly known as:  COUMADIN Take 3 mg by mouth daily.      Follow-up Information    Thressa Sheller, MD Follow up.   Specialty:  Internal Medicine Contact information: [address], [place] [address] [phone]        [facility].   Why:  Office will call you to schedule your new patient cardiology appointment. Please call [phone] if you have not heard back within 3 days.         Allergies  Allergen Reactions  . Codeine Other (See Comments)    "Gives me headaches"    Consultations:  [facility] by phone only, Dr.  Meda Coffee  Procedures/Studies: Dg Chest 2 View  Result Date: 11/05/2016 CLINICAL DATA:  Initial evaluation for acute shortness of breath. EXAM: CHEST  2 VIEW COMPARISON:  Prior radiograph from 08/06/2015. FINDINGS: Moderate cardiomegaly, stable. Mediastinal silhouette within normal limits. Aortic atherosclerosis. The Lungs normally inflated. Diffuse pulmonary vascular congestion with interstitial prominence, compatible pulmonary interstitial edema. No focal infiltrates. No pleural effusion. No pneumothorax. No acute osseus abnormality. Chronic compression deformity at the thoracolumbar junction noted, stable. IMPRESSION: 1. Stable cardiomegaly with mild diffuse pulmonary interstitial edema. 2. Aortic atherosclerosis. Electronically Signed   By: Jeannine Boga M.D.   On: 11/05/2016 03:09    ECHOCARDIOGRAM 3/24:  Study Conclusions  - Left ventricle: The cavity size was normal. Wall thickness was   increased in a pattern of moderate to severe LVH. Systolic   function was moderately reduced. The estimated ejection fraction   was = 40%. Doppler parameters are consistent with restrictive   physiology, indicative of decreased left ventricular diastolic   compliance and/or increased  left atrial pressure. Doppler   parameters are consistent with high ventricular filling pressure. - Aortic valve: Moderately calcified annulus. Trileaflet;   moderately thickened leaflets. Valve area (VTI): 1.24 cm^2. Valve   area (Vmax): 1.39 cm^2. Valve area (Vmean): 1.23 cm^2. - Aorta: The visualized portion of the proximal ascending aorta is   mildly dilated at 3.7 cm. - Mitral valve: Mildly calcified annulus. Mildly thickened leaflets   . There is some portion of valve/subvalvular apparatus that   prolapses into the LA. Unable to discern anatomy from available   images. The MR jet is limited in visualization, may be   underestimated. There was mild to moderate regurgitation.   Deceleration time: 123 ms. Valve  area by continuity equation   (using LVOT flow): 1.64 cm^2. - Left atrium: The atrium was severely dilated. - Right ventricle: The cavity size was mildly dilated. - Right atrium: The atrium was severely dilated. - Atrial septum: There was a patent foramen ovale.  Subjective: Dyspnea dramatically improved even on exertion. Has been urinating frequently, says 40mg  lasix po was also effective as outpatient. No chest pain, palpitations. Leg swelling significantly better. Wants to go home if at all possible.   Discharge Exam: Vitals:   11/06/16 1101 11/06/16 1104  BP: 131/83   Pulse: (!) 101 (!) 101  Resp:    Temp:     General: Pleasant well-appearing male in no distress Cardiovascular: Irreg irreg HR ~90 on my exam. No murmur. No JVD. 1+ BL LE edema. Respiratory: Nonlabored on room air, bibasilar crackles remain but are significantly improved.   Labs: BNP (last 3 results)  Recent Labs  11/05/16 0200  BNP 297.9*   Basic Metabolic Panel:  Recent Labs Lab 11/05/16 0200 11/06/16 0503  NA 136 140  K 3.5 2.9*  CL 94* 95*  CO2 31 31  GLUCOSE 135* 106*  BUN 23* 17  CREATININE 1.36* 1.21  CALCIUM 8.9 8.6*   CBC:  Recent Labs Lab 11/05/16 0200  WBC 11.6*  HGB 14.8  HCT 44.5  MCV 87.9  PLT 231   Cardiac Enzymes:  Recent Labs Lab 11/05/16 0200 11/05/16 0739 11/05/16 1040 11/05/16 1425  TROPONINI 0.08* 0.09* 0.08* 0.08*   CBG:  Recent Labs Lab 11/05/16 1139 11/05/16 1651 11/05/16 2113 11/06/16 0745 11/06/16 1153  GLUCAP 121* 137* 119* 114* 127*   Thyroid function studies  Recent Labs  11/05/16 0739  TSH 1.956   Time coordinating discharge: Approximately 40 minutes  Vance Gather, MD  [facility] 11/06/2016, 2:21 PM Pager [phone]

## 2016-11-09 ENCOUNTER — Encounter (HOSPITAL_COMMUNITY): Payer: Self-pay | Admitting: Internal Medicine

## 2016-11-09 ENCOUNTER — Other Ambulatory Visit (HOSPITAL_COMMUNITY): Payer: Self-pay | Admitting: *Deleted

## 2016-11-09 ENCOUNTER — Encounter (HOSPITAL_COMMUNITY): Payer: Self-pay | Admitting: *Deleted

## 2016-11-09 ENCOUNTER — Ambulatory Visit (HOSPITAL_COMMUNITY)
Admission: RE | Admit: 2016-11-09 | Discharge: 2016-11-09 | Disposition: A | Discharge status: Home / Self Care | Payer: BLUE CROSS/BLUE SHIELD | Source: Ambulatory Visit | Attending: Internal Medicine | Admitting: Internal Medicine

## 2016-11-09 VITALS — BP 152/78 | HR 95 | Wt 147.5 lb

## 2016-11-09 DIAGNOSIS — Z9889 Other specified postprocedural states: Secondary | ICD-10-CM | POA: Insufficient documentation

## 2016-11-09 DIAGNOSIS — I5022 Chronic systolic (congestive) heart failure: Secondary | ICD-10-CM

## 2016-11-09 DIAGNOSIS — N400 Enlarged prostate without lower urinary tract symptoms: Secondary | ICD-10-CM | POA: Diagnosis not present

## 2016-11-09 DIAGNOSIS — I110 Hypertensive heart disease with heart failure: Secondary | ICD-10-CM | POA: Diagnosis not present

## 2016-11-09 DIAGNOSIS — I482 Chronic atrial fibrillation: Secondary | ICD-10-CM | POA: Insufficient documentation

## 2016-11-09 DIAGNOSIS — I350 Nonrheumatic aortic (valve) stenosis: Secondary | ICD-10-CM | POA: Diagnosis not present

## 2016-11-09 DIAGNOSIS — G4733 Obstructive sleep apnea (adult) (pediatric): Secondary | ICD-10-CM | POA: Insufficient documentation

## 2016-11-09 DIAGNOSIS — M109 Gout, unspecified: Secondary | ICD-10-CM | POA: Diagnosis not present

## 2016-11-09 DIAGNOSIS — E119 Type 2 diabetes mellitus without complications: Secondary | ICD-10-CM | POA: Insufficient documentation

## 2016-11-09 DIAGNOSIS — Z7901 Long term (current) use of anticoagulants: Secondary | ICD-10-CM | POA: Diagnosis not present

## 2016-11-09 DIAGNOSIS — I425 Other restrictive cardiomyopathy: Secondary | ICD-10-CM | POA: Diagnosis not present

## 2016-11-09 DIAGNOSIS — Z87442 Personal history of urinary calculi: Secondary | ICD-10-CM | POA: Insufficient documentation

## 2016-11-09 DIAGNOSIS — I480 Paroxysmal atrial fibrillation: Secondary | ICD-10-CM | POA: Diagnosis not present

## 2016-11-09 DIAGNOSIS — E876 Hypokalemia: Secondary | ICD-10-CM | POA: Insufficient documentation

## 2016-11-09 LAB — CBC
HEMATOCRIT: 46.5 % (ref 39.0–52.0)
Hemoglobin: 15.5 g/dL (ref 13.0–17.0)
MCH: 28.8 pg (ref 26.0–34.0)
MCHC: 33.3 g/dL (ref 30.0–36.0)
MCV: 86.4 fL (ref 78.0–100.0)
Platelets: 204 10*3/uL (ref 150–400)
RBC: 5.38 MIL/uL (ref 4.22–5.81)
RDW: 15.2 % (ref 11.5–15.5)
WBC: 13.2 10*3/uL — ABNORMAL HIGH (ref 4.0–10.5)

## 2016-11-09 LAB — PROTIME-INR
INR: 2.15
Prothrombin Time: 24.4 seconds — ABNORMAL HIGH (ref 11.4–15.2)

## 2016-11-09 LAB — COMPREHENSIVE METABOLIC PANEL
ALBUMIN: 3.5 g/dL (ref 3.5–5.0)
ALT: 15 U/L — ABNORMAL LOW (ref 17–63)
AST: 22 U/L (ref 15–41)
Alkaline Phosphatase: 55 U/L (ref 38–126)
Anion gap: 12 (ref 5–15)
BILIRUBIN TOTAL: 1.6 mg/dL — AB (ref 0.3–1.2)
BUN: 20 mg/dL (ref 6–20)
CHLORIDE: 95 mmol/L — AB (ref 101–111)
CO2: 30 mmol/L (ref 22–32)
Calcium: 9.1 mg/dL (ref 8.9–10.3)
Creatinine, Ser: 1.23 mg/dL (ref 0.61–1.24)
GFR calc Af Amer: >60 mL/min (ref >60)
GFR calc non Af Amer: 54 mL/min — ABNORMAL LOW (ref >60)
GLUCOSE: 117 mg/dL — AB (ref 65–99)
POTASSIUM: 3.2 mmol/L — AB (ref 3.5–5.1)
Sodium: 137 mmol/L (ref 135–145)
TOTAL PROTEIN: 6.7 g/dL (ref 6.5–8.1)

## 2016-11-09 LAB — BRAIN NATRIURETIC PEPTIDE: B Natriuretic Peptide: 995.3 pg/mL — ABNORMAL HIGH (ref 0.0–100.0)

## 2016-11-09 LAB — DIGOXIN LEVEL: Digoxin Level: 0.9 ng/mL (ref 0.8–2.0)

## 2016-11-09 MED ORDER — FUROSEMIDE 40 MG PO TABS: 20.0000 mg | ORAL_TABLET | Freq: Every day | ORAL | 0 refills | Status: DC

## 2016-11-09 MED ORDER — SPIRONOLACTONE 25 MG PO TABS: 25.0000 mg | ORAL_TABLET | Freq: Every day | ORAL | 3 refills | Status: DC

## 2016-11-09 MED ORDER — PANTOPRAZOLE SODIUM 40 MG PO TBEC: 40.0000 mg | DELAYED_RELEASE_TABLET | Freq: Every day | ORAL | 11 refills | Status: DC

## 2016-11-09 NOTE — Progress Notes (Signed)
ADVANCED HF CLINIC CONSULT NOTE  Referring Physician: Bonner Puna Primary Care: [facility] Primary Cardiologist: Ola Spurr  HPI:   Jemiah Ellenburg is a 81 y.o. male with h/o HTN, diabetes chronic atrial fibrillation and a patent foramen ovale  Has had AF for years. Followed by Dr. Ola Spurr at [facility]. In reviewing records in [place] has also had an AFL ablation. Echo in . 12/13 EF 40-45%. Does not recall having a cath in past. Denies h/o of HF. Had sleep study in past and placed on CPAP. He was subsequently told OSA was mild and CPAP stopped.   In January began to develop HF symptoms with SOB, fatigue, LE edema, orthopnea and PND. Says in December was riding stationary bike 3 miles per day without problem. Had URI at end of December and got worse. Saw Dr. Ola Spurr in January. Started lasix 20mg  PRN and ordered echo and Holter monitor due to elevated AF rate.   Admitted to [facility] over the weekend with worseningg HF. BNP 957. AF rate elevated. Echo 40-45% with moderate RV dysfunction. Mild AS. Diuresed 9 pounds and placed on metoprolol and digoxin for HR control. Weight on d/c 145. Sent home on lasix 40 daily. Says he feels better. No orthopnea or PND any more. Still SOB with exertion and fatigued. Weight stable  Echo 12/13 EF 40-45%  Review of Systems: [y] = yes, [ ]  = no   General: Weight gain Blue.Reese ]; Weight loss [ ] ; Anorexia [ y]; Fatigue Blue.Reese ]; Fever [ ] ; Chills [ ] ; Weakness [ ]   Cardiac: Chest pain/pressure [ ] ; Resting SOB [ ] ; Exertional SOB Blue.Reese ]; Orthopnea Blue.Reese ]; Pedal Edema Blue.Reese ]; Palpitations [ ] ; Syncope [ ] ; Presyncope [ ] ; Paroxysmal nocturnal dyspnea[ y]  Pulmonary: Cough [ ] ; Wheezing[ ] ; Hemoptysis[ ] ; Sputum [ ] ; Snoring Blue.Reese ]  GI: Vomiting[ ] ; Dysphagia[ ] ; Melena[ ] ; Hematochezia [ ] ; Heartburn[ ] ; Abdominal pain [ ] ; Constipation [ ] ; Diarrhea [ ] ; BRBPR [ ]   GU: Hematuria[ ] ; Dysuria [ ] ; Nocturia[ ]   Vascular: Pain in legs with walking [ ] ; Pain in feet with lying flat [  ]; Non-healing sores [ ] ; Stroke [ ] ; TIA [ ] ; Slurred speech [ ] ;  Neuro: Headaches[ ] ; Vertigo[ ] ; Seizures[ ] ; Paresthesias[ ] ;Blurred vision [ ] ; Diplopia [ ] ; Vision changes [ ]   Ortho/Skin: Arthritis Blue.Reese ]; Joint pain [y] Muscle pain [ ] ; Joint swelling [ ] ; Back Pain [ ] ; Rash [ ]   Psych: Depression[ ] ; Anxiety[ ]   Heme: Bleeding problems [ ] ; Clotting disorders [ ] ; Anemia [ ]   Endocrine: Diabetes [ y]; Thyroid dysfunction[ ]    Past Medical History:  Diagnosis Date  . Atrial fibrillation (HCC)    on coumadin.  cardiologist is in [facility] dr [name].  S/P multiple DCCVs and prior ablation.   Marland Kitchen BPH (benign prostatic hyperplasia)    by ST in 2000.   . Diverticulitis    question if he ever had imaging confirmed diverticulitis  . Gout   . Kidney stones    treated with lithotripsy   . Rupture of biceps tendon 2006   left distal bicep tendon    Current Outpatient Prescriptions  Medication Sig Dispense Refill  . Cholecalciferol (VITAMIN D3) 1000 units CAPS Take 2,000 Units by mouth daily.    . Coenzyme Q10 (CO Q-10) 100 MG CAPS Take 200 mg by mouth daily.    . digoxin (LANOXIN) 0.125 MG tablet Take 0.125 mg by mouth daily.    Marland Kitchen  empagliflozin (JARDIANCE) 10 MG TABS tablet Take 10 mg by mouth daily.    . furosemide (LASIX) 40 MG tablet Take 1 tablet (40 mg total) by mouth daily. 30 tablet 0  . losartan (COZAAR) 25 MG tablet Take 25 mg by mouth daily.    . metoprolol succinate (TOPROL-XL) 25 MG 24 hr tablet Take 1 tablet (25 mg total) by mouth daily. 30 tablet 0  . NON FORMULARY Take 1 tablet by mouth daily. Super B energy Complex    . potassium citrate (UROCIT-K) 10 MEQ (1080 MG) SR tablet Take 20 mEq by mouth daily.    . vitamin B-12 (CYANOCOBALAMIN) 1000 MCG tablet Take 1,000 mcg by mouth daily.    . vitamin C (ASCORBIC ACID) 500 MG tablet Take 500 mg by mouth daily.    Marland Kitchen warfarin (COUMADIN) 3 MG tablet Take 3 mg by mouth daily.     No current facility-administered  medications for this encounter.     Allergies  Allergen Reactions  . Codeine Other (See Comments)    "Gives me headaches"      Social History   Social History  . Marital status: Married    Spouse name: N/A  . Number of children: N/A  . Years of education: N/A   Occupational History  . Not on file.   Social History Main Topics  . Smoking status: Never Smoker  . Smokeless tobacco: Never Used  . Alcohol use No  . Drug use: No  . Sexual activity: Not on file   Other Topics Concern  . Not on file   Social History Narrative  . No narrative on file     No family history on file.  Vitals:   11/09/16 1018  BP: (!) 152/78  Pulse: 95  SpO2: 97%  Weight: 147 lb 8 oz (66.9 kg)    PHYSICAL EXAM: General:  Elderly. Well appearing. No respiratory difficulty HEENT: normal Neck: supple. no JVD. Carotids 2+ bilat; no bruits. No lymphadenopathy or thryomegaly appreciated. Cor: PMI nondisplaced. IRR. IRR. 2/6 SEM RUSB Lungs: clear Abdomen: soft, nontender, nondistended. No hepatosplenomegaly. No bruits or masses. Good bowel sounds. Extremities: no cyanosis, clubbing, rash, edema Neuro: alert & oriented x 3, cranial nerves grossly intact. moves all 4 extremities w/o difficulty. Affect pleasant.  ECG: AF 116 +PVC. RBBB and LAFB (11/05/16)   ASSESSMENT & PLAN: 1. Chronic systolic HF --Echo reviewed personally. EF 40-45% with moderate RV dysfunction. 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 Etiology unclear. ? HTN, OSA or infiltrative process --cut lasix to 20mg  daily. Take extra as needed --start spiro 25mg  daily --stop K --BMET today and next week --R/L HC --cMRI, check SPEP 2. Chronic AF  --CHADSVASC = 5 (age, DM, HTN, HF) --Rate control borderline. Discussed NOAC. May consider in future  3. Mild aortic stenosis 4. HTN  --BP mildly elevated. Adding spiro. Likely switch losartan to [facility] soon. 5. DM2 6. Hypokalemia --Adding spiro. Stop K. Check today.  7. Probable  OSA --Will schedule sleep study after initial cardiology complete  Total time spent 45 minutes. Over half that time spent discussing above.    Glori Bickers, MD  11:38 AM

## 2016-11-09 NOTE — Patient Instructions (Signed)
Decrease Furosemide to 20 mg (1/2 tab) daily, can take an extra 1/2 tab as needed  Stop Potassium  Start Spironolactone 25 mg daily  Start Protonix 40 mg daily  Labs today  Heart Catheterization on Tue 4/3, see instruction sheet  Your physician recommends that you schedule a follow-up appointment in: 3-4 weeks

## 2016-11-09 NOTE — Addendum Note (Signed)
Encounter addended by: Scarlette Calico, RN on: 11/09/2016 11:58 AM<BR>    Actions taken: Diagnosis association updated, Order list changed, Sign clinical note

## 2016-11-10 LAB — PROTEIN ELECTROPHORESIS, SERUM
A/G Ratio: 0.8 (ref 0.7–1.7)
Albumin ELP: 1.9 g/dL — ABNORMAL LOW (ref 2.9–4.4)
Alpha-1-Globulin: 0.4 g/dL (ref 0.0–0.4)
Alpha-2-Globulin: 0.6 g/dL (ref 0.4–1.0)
BETA GLOBULIN: 0.7 g/dL (ref 0.7–1.3)
GAMMA GLOBULIN: 0.8 g/dL (ref 0.4–1.8)
Globulin, Total: 2.4 g/dL (ref 2.2–3.9)
Total Protein ELP: 4.3 g/dL — ABNORMAL LOW (ref 6.0–8.5)

## 2016-11-15 ENCOUNTER — Other Ambulatory Visit (HOSPITAL_COMMUNITY): Payer: Self-pay | Admitting: *Deleted

## 2016-11-15 ENCOUNTER — Ambulatory Visit (HOSPITAL_COMMUNITY)
Admission: RE | Admit: 2016-11-15 | Discharge: 2016-11-15 | Disposition: A | Discharge status: Home / Self Care | Payer: BLUE CROSS/BLUE SHIELD | Source: Ambulatory Visit | Attending: Internal Medicine | Admitting: Internal Medicine

## 2016-11-15 ENCOUNTER — Ambulatory Visit (HOSPITAL_COMMUNITY)
Admission: RE | Disposition: A | Discharge status: Home / Self Care | Payer: Self-pay | Source: Ambulatory Visit | Attending: Internal Medicine

## 2016-11-15 DIAGNOSIS — N400 Enlarged prostate without lower urinary tract symptoms: Secondary | ICD-10-CM | POA: Insufficient documentation

## 2016-11-15 DIAGNOSIS — I5022 Chronic systolic (congestive) heart failure: Secondary | ICD-10-CM | POA: Diagnosis not present

## 2016-11-15 DIAGNOSIS — G4733 Obstructive sleep apnea (adult) (pediatric): Secondary | ICD-10-CM | POA: Diagnosis not present

## 2016-11-15 DIAGNOSIS — E876 Hypokalemia: Secondary | ICD-10-CM | POA: Diagnosis not present

## 2016-11-15 DIAGNOSIS — Z7901 Long term (current) use of anticoagulants: Secondary | ICD-10-CM | POA: Diagnosis not present

## 2016-11-15 DIAGNOSIS — Q211 Atrial septal defect: Secondary | ICD-10-CM | POA: Insufficient documentation

## 2016-11-15 DIAGNOSIS — I2510 Atherosclerotic heart disease of native coronary artery without angina pectoris: Secondary | ICD-10-CM

## 2016-11-15 DIAGNOSIS — I272 Pulmonary hypertension, unspecified: Secondary | ICD-10-CM | POA: Diagnosis not present

## 2016-11-15 DIAGNOSIS — I2584 Coronary atherosclerosis due to calcified coronary lesion: Secondary | ICD-10-CM | POA: Diagnosis not present

## 2016-11-15 DIAGNOSIS — E119 Type 2 diabetes mellitus without complications: Secondary | ICD-10-CM | POA: Diagnosis not present

## 2016-11-15 DIAGNOSIS — I110 Hypertensive heart disease with heart failure: Secondary | ICD-10-CM | POA: Insufficient documentation

## 2016-11-15 DIAGNOSIS — M109 Gout, unspecified: Secondary | ICD-10-CM | POA: Diagnosis not present

## 2016-11-15 DIAGNOSIS — I350 Nonrheumatic aortic (valve) stenosis: Secondary | ICD-10-CM | POA: Diagnosis not present

## 2016-11-15 DIAGNOSIS — I482 Chronic atrial fibrillation: Secondary | ICD-10-CM | POA: Diagnosis not present

## 2016-11-15 HISTORY — PX: RIGHT/LEFT HEART CATH AND CORONARY ANGIOGRAPHY: CATH118266

## 2016-11-15 LAB — BASIC METABOLIC PANEL
ANION GAP: 12 (ref 5–15)
BUN: 24 mg/dL — AB (ref 6–20)
CO2: 28 mmol/L (ref 22–32)
Calcium: 9.1 mg/dL (ref 8.9–10.3)
Chloride: 94 mmol/L — ABNORMAL LOW (ref 101–111)
Creatinine, Ser: 1.43 mg/dL — ABNORMAL HIGH (ref 0.61–1.24)
GFR calc Af Amer: 52 mL/min — ABNORMAL LOW (ref >60)
GFR calc non Af Amer: 45 mL/min — ABNORMAL LOW (ref >60)
GLUCOSE: 126 mg/dL — AB (ref 65–99)
POTASSIUM: 3.3 mmol/L — AB (ref 3.5–5.1)
Sodium: 134 mmol/L — ABNORMAL LOW (ref 135–145)

## 2016-11-15 LAB — POCT I-STAT 3, ART BLOOD GAS (G3+)
Acid-Base Excess: 3 mmol/L — ABNORMAL HIGH (ref 0.0–2.0)
BICARBONATE: 26.3 mmol/L (ref 20.0–28.0)
O2 SAT: 98 %
PH ART: 7.461 — AB (ref 7.350–7.450)
TCO2: 27 mmol/L (ref 0–100)
pCO2 arterial: 36.9 mmHg (ref 32.0–48.0)
pO2, Arterial: 98 mmHg (ref 83.0–108.0)

## 2016-11-15 LAB — POCT I-STAT 3, VENOUS BLOOD GAS (G3P V)
ACID-BASE EXCESS: 1 mmol/L (ref 0.0–2.0)
Acid-Base Excess: 4 mmol/L — ABNORMAL HIGH (ref 0.0–2.0)
BICARBONATE: 29.4 mmol/L — AB (ref 20.0–28.0)
Bicarbonate: 26.8 mmol/L (ref 20.0–28.0)
O2 Saturation: 68 %
O2 Saturation: 70 %
PCO2 VEN: 45.8 mmHg (ref 44.0–60.0)
PO2 VEN: 36 mmHg (ref 32.0–45.0)
TCO2: 31 mmol/L (ref 0–100)
TCO2: 28 mmol/L (ref 0–100)
pCO2, Ven: 44.1 mmHg (ref 44.0–60.0)
pH, Ven: 7.391 (ref 7.250–7.430)
pH, Ven: 7.415 (ref 7.250–7.430)
pO2, Ven: 36 mmHg (ref 32.0–45.0)

## 2016-11-15 LAB — PROTIME-INR
INR: 1.7
Prothrombin Time: 20.2 seconds — ABNORMAL HIGH (ref 11.4–15.2)

## 2016-11-15 SURGERY — RIGHT/LEFT HEART CATH AND CORONARY ANGIOGRAPHY
Anesthesia: LOCAL

## 2016-11-15 MED ORDER — SODIUM CHLORIDE 0.9% FLUSH: 3.0000 mL | INTRAVENOUS | Status: DC | PRN

## 2016-11-15 MED ORDER — ASPIRIN 81 MG PO CHEW
81.0000 mg | CHEWABLE_TABLET | ORAL | Status: AC
Start: 2016-11-15 — End: 2016-11-15
  Administered 2016-11-15: 81 mg via ORAL

## 2016-11-15 MED ORDER — MIDAZOLAM HCL 2 MG/2ML IJ SOLN
INTRAMUSCULAR | Status: AC
  Filled 2016-11-15: qty 2

## 2016-11-15 MED ORDER — SODIUM CHLORIDE 0.9% FLUSH: 3.0000 mL | Freq: Two times a day (BID) | INTRAVENOUS | Status: DC

## 2016-11-15 MED ORDER — SODIUM CHLORIDE 0.9 % IV SOLN: INTRAVENOUS | Status: AC

## 2016-11-15 MED ORDER — ACETAMINOPHEN 325 MG PO TABS: 650.0000 mg | ORAL_TABLET | ORAL | Status: DC | PRN

## 2016-11-15 MED ORDER — SODIUM CHLORIDE 0.9 % IV SOLN: 250.0000 mL | INTRAVENOUS | Status: DC | PRN

## 2016-11-15 MED ORDER — ASPIRIN 81 MG PO CHEW
CHEWABLE_TABLET | ORAL | Status: AC
  Administered 2016-11-15: 81 mg via ORAL
  Filled 2016-11-15: qty 1

## 2016-11-15 MED ORDER — IOPAMIDOL (ISOVUE-370) INJECTION 76%
INTRAVENOUS | Status: AC
  Filled 2016-11-15: qty 100

## 2016-11-15 MED ORDER — MIDAZOLAM HCL 2 MG/2ML IJ SOLN
INTRAMUSCULAR | Status: DC | PRN
  Administered 2016-11-15: 1 mg via INTRAVENOUS

## 2016-11-15 MED ORDER — LIDOCAINE HCL (PF) 1 % IJ SOLN
INTRAMUSCULAR | Status: AC
  Filled 2016-11-15: qty 30

## 2016-11-15 MED ORDER — FENTANYL CITRATE (PF) 100 MCG/2ML IJ SOLN
INTRAMUSCULAR | Status: DC | PRN
  Administered 2016-11-15: 25 ug via INTRAVENOUS

## 2016-11-15 MED ORDER — POTASSIUM CHLORIDE CRYS ER 20 MEQ PO TBCR
40.0000 meq | EXTENDED_RELEASE_TABLET | Freq: Once | ORAL | Status: AC
  Administered 2016-11-15: 40 meq via ORAL

## 2016-11-15 MED ORDER — SODIUM CHLORIDE 0.9 % IV SOLN
INTRAVENOUS | Status: DC
  Administered 2016-11-15: 08:00:00 via INTRAVENOUS

## 2016-11-15 MED ORDER — ONDANSETRON HCL 4 MG/2ML IJ SOLN: 4.0000 mg | Freq: Four times a day (QID) | INTRAMUSCULAR | Status: DC | PRN

## 2016-11-15 MED ORDER — VERAPAMIL HCL 2.5 MG/ML IV SOLN
INTRAVENOUS | Status: AC
  Filled 2016-11-15: qty 2

## 2016-11-15 MED ORDER — HEPARIN (PORCINE) IN NACL 2-0.9 UNIT/ML-% IJ SOLN
INTRAMUSCULAR | Status: AC
  Filled 2016-11-15: qty 1000

## 2016-11-15 MED ORDER — HEPARIN (PORCINE) IN NACL 2-0.9 UNIT/ML-% IJ SOLN
INTRAMUSCULAR | Status: DC | PRN
  Administered 2016-11-15: 1000 mL

## 2016-11-15 MED ORDER — POTASSIUM CHLORIDE CRYS ER 20 MEQ PO TBCR
EXTENDED_RELEASE_TABLET | ORAL | Status: AC
  Administered 2016-11-15: 40 meq via ORAL
  Filled 2016-11-15: qty 2

## 2016-11-15 MED ORDER — FENTANYL CITRATE (PF) 100 MCG/2ML IJ SOLN
INTRAMUSCULAR | Status: AC
  Filled 2016-11-15: qty 2

## 2016-11-15 MED ORDER — LIDOCAINE HCL (PF) 1 % IJ SOLN
INTRAMUSCULAR | Status: DC | PRN
  Administered 2016-11-15: 15 mL
  Administered 2016-11-15 (×2): 2 mL

## 2016-11-15 MED ORDER — APIXABAN 2.5 MG PO TABS: 2.5000 mg | ORAL_TABLET | Freq: Two times a day (BID) | ORAL | 3 refills | Status: DC

## 2016-11-15 MED ORDER — IOPAMIDOL (ISOVUE-370) INJECTION 76%
INTRAVENOUS | Status: DC | PRN
  Administered 2016-11-15: 50 mL via INTRA_ARTERIAL

## 2016-11-15 SURGICAL SUPPLY — 19 items
CATH BALLN WEDGE 5F 110CM (CATHETERS) ×2 IMPLANT
CATH INFINITI 4FR RCB (CATHETERS) ×2 IMPLANT
CATH INFINITI 5FR MULTPACK ANG (CATHETERS) ×2 IMPLANT
CATH SWAN GANZ 7F STRAIGHT (CATHETERS) ×2 IMPLANT
GLIDESHEATH SLEND SS 6F .021 (SHEATH) ×2 IMPLANT
GUIDEWIRE .025 260CM (WIRE) ×2 IMPLANT
GUIDEWIRE INQWIRE 1.5J.035X260 (WIRE) ×1 IMPLANT
INQWIRE 1.5J .035X260CM (WIRE) ×2
KIT HEART LEFT (KITS) ×2 IMPLANT
PACK CARDIAC CATHETERIZATION (CUSTOM PROCEDURE TRAY) ×2 IMPLANT
SHEATH FAST CATH BRACH 5F 5CM (SHEATH) ×2 IMPLANT
SHEATH GLIDE SLENDER 4/5FR (SHEATH) ×2 IMPLANT
SHEATH PINNACLE 5F 10CM (SHEATH) ×2 IMPLANT
SHEATH PINNACLE 7F 10CM (SHEATH) ×2 IMPLANT
TRANSDUCER W/STOPCOCK (MISCELLANEOUS) ×2 IMPLANT
TUBING CIL FLEX 10 FLL-RA (TUBING) ×2 IMPLANT
WIRE EMERALD 3MM-J .025X260CM (WIRE) ×2 IMPLANT
WIRE EMERALD 3MM-J .035X150CM (WIRE) ×2 IMPLANT
WIRE HI TORQ VERSACORE-J 145CM (WIRE) ×2 IMPLANT

## 2016-11-15 NOTE — Interval H&P Note (Signed)
History and Physical Interval Note:  11/15/2016 9:09 AM  Ricardo [name]  has presented today for surgery, with the diagnosis of chf  The various methods of treatment have been discussed with the patient and family. After consideration of risks, benefits and other options for treatment, the patient has consented to  Procedure(s): Right/Left Heart Cath and Coronary Angiography (N/A) and coronary angioplasty as a surgical intervention .  The patient's history has been reviewed, patient examined, no change in status, stable for surgery.  I have reviewed the patient's chart and labs.  Questions were answered to the patient's satisfaction.     Kerrigan Glendening, Quillian Quince

## 2016-11-15 NOTE — H&P (View-Only) (Signed)
ADVANCED HF CLINIC CONSULT NOTE  Referring Physician: Bonner Puna Primary Care: [facility] Primary Cardiologist: Ola Spurr  HPI:   Ricardo [name] is a 81 y.o. male with h/o HTN, diabetes chronic atrial fibrillation and a patent foramen ovale  Has had AF for years. Followed by Dr. Ola Spurr at [facility]. In reviewing records in [place] has also had an AFL ablation. Echo in . 12/13 EF 40-45%. Does not recall having a cath in past. Denies h/o of HF. Had sleep study in past and placed on CPAP. He was subsequently told OSA was mild and CPAP stopped.   In January began to develop HF symptoms with SOB, fatigue, LE edema, orthopnea and PND. Says in December was riding stationary bike 3 miles per day without problem. Had URI at end of December and got worse. Saw Dr. Ola Spurr in January. Started lasix 20mg  PRN and ordered echo and Holter monitor due to elevated AF rate.   Admitted to [facility] over the weekend with worseningg HF. BNP 957. AF rate elevated. Echo 40-45% with moderate RV dysfunction. Mild AS. Diuresed 9 pounds and placed on metoprolol and digoxin for HR control. Weight on d/c 145. Sent home on lasix 40 daily. Says he feels better. No orthopnea or PND any more. Still SOB with exertion and fatigued. Weight stable  Echo 12/13 EF 40-45%  Review of Systems: [y] = yes, [ ]  = no   General: Weight gain Blue.Reese ]; Weight loss [ ] ; Anorexia [ y]; Fatigue Blue.Reese ]; Fever [ ] ; Chills [ ] ; Weakness [ ]   Cardiac: Chest pain/pressure [ ] ; Resting SOB [ ] ; Exertional SOB Blue.Reese ]; Orthopnea Blue.Reese ]; Pedal Edema Blue.Reese ]; Palpitations [ ] ; Syncope [ ] ; Presyncope [ ] ; Paroxysmal nocturnal dyspnea[ y]  Pulmonary: Cough [ ] ; Wheezing[ ] ; Hemoptysis[ ] ; Sputum [ ] ; Snoring Blue.Reese ]  GI: Vomiting[ ] ; Dysphagia[ ] ; Melena[ ] ; Hematochezia [ ] ; Heartburn[ ] ; Abdominal pain [ ] ; Constipation [ ] ; Diarrhea [ ] ; BRBPR [ ]   GU: Hematuria[ ] ; Dysuria [ ] ; Nocturia[ ]   Vascular: Pain in legs with walking [ ] ; Pain in feet with lying flat [  ]; Non-healing sores [ ] ; Stroke [ ] ; TIA [ ] ; Slurred speech [ ] ;  Neuro: Headaches[ ] ; Vertigo[ ] ; Seizures[ ] ; Paresthesias[ ] ;Blurred vision [ ] ; Diplopia [ ] ; Vision changes [ ]   Ortho/Skin: Arthritis Blue.Reese ]; Joint pain [y] Muscle pain [ ] ; Joint swelling [ ] ; Back Pain [ ] ; Rash [ ]   Psych: Depression[ ] ; Anxiety[ ]   Heme: Bleeding problems [ ] ; Clotting disorders [ ] ; Anemia [ ]   Endocrine: Diabetes [ y]; Thyroid dysfunction[ ]    Past Medical History:  Diagnosis Date  . Atrial fibrillation (HCC)    on coumadin.  cardiologist is in [facility] dr [name].  S/P multiple DCCVs and prior ablation.   Marland Kitchen BPH (benign prostatic hyperplasia)    by ST in 2000.   . Diverticulitis    question if he ever had imaging confirmed diverticulitis  . Gout   . Kidney stones    treated with lithotripsy   . Rupture of biceps tendon 2006   left distal bicep tendon    Current Outpatient Prescriptions  Medication Sig Dispense Refill  . Cholecalciferol (VITAMIN D3) 1000 units CAPS Take 2,000 Units by mouth daily.    . Coenzyme Q10 (CO Q-10) 100 MG CAPS Take 200 mg by mouth daily.    . digoxin (LANOXIN) 0.125 MG tablet Take 0.125 mg by mouth daily.    Marland Kitchen  empagliflozin (JARDIANCE) 10 MG TABS tablet Take 10 mg by mouth daily.    . furosemide (LASIX) 40 MG tablet Take 1 tablet (40 mg total) by mouth daily. 30 tablet 0  . losartan (COZAAR) 25 MG tablet Take 25 mg by mouth daily.    . metoprolol succinate (TOPROL-XL) 25 MG 24 hr tablet Take 1 tablet (25 mg total) by mouth daily. 30 tablet 0  . NON FORMULARY Take 1 tablet by mouth daily. Super B energy Complex    . potassium citrate (UROCIT-K) 10 MEQ (1080 MG) SR tablet Take 20 mEq by mouth daily.    . vitamin B-12 (CYANOCOBALAMIN) 1000 MCG tablet Take 1,000 mcg by mouth daily.    . vitamin C (ASCORBIC ACID) 500 MG tablet Take 500 mg by mouth daily.    Marland Kitchen warfarin (COUMADIN) 3 MG tablet Take 3 mg by mouth daily.     No current facility-administered  medications for this encounter.     Allergies  Allergen Reactions  . Codeine Other (See Comments)    "Gives me headaches"      Social History   Social History  . Marital status: Married    Spouse name: N/A  . Number of children: N/A  . Years of education: N/A   Occupational History  . Not on file.   Social History Main Topics  . Smoking status: Never Smoker  . Smokeless tobacco: Never Used  . Alcohol use No  . Drug use: No  . Sexual activity: Not on file   Other Topics Concern  . Not on file   Social History Narrative  . No narrative on file     No family history on file.  Vitals:   11/09/16 1018  BP: (!) 152/78  Pulse: 95  SpO2: 97%  Weight: 147 lb 8 oz (66.9 kg)    PHYSICAL EXAM: General:  Elderly. Well appearing. No respiratory difficulty HEENT: normal Neck: supple. no JVD. Carotids 2+ bilat; no bruits. No lymphadenopathy or thryomegaly appreciated. Cor: PMI nondisplaced. IRR. IRR. 2/6 SEM RUSB Lungs: clear Abdomen: soft, nontender, nondistended. No hepatosplenomegaly. No bruits or masses. Good bowel sounds. Extremities: no cyanosis, clubbing, rash, edema Neuro: alert & oriented x 3, cranial nerves grossly intact. moves all 4 extremities w/o difficulty. Affect pleasant.  ECG: AF 116 +PVC. RBBB and LAFB (11/05/16)   ASSESSMENT & PLAN: 1. Chronic systolic HF --Echo reviewed personally. EF 40-45% with moderate RV dysfunction. 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 Etiology unclear. ? HTN, OSA or infiltrative process --cut lasix to 20mg  daily. Take extra as needed --start spiro 25mg  daily --stop K --BMET today and next week --R/L HC --cMRI, check SPEP 2. Chronic AF  --CHADSVASC = 5 (age, DM, HTN, HF) --Rate control borderline. Discussed NOAC. May consider in future  3. Mild aortic stenosis 4. HTN  --BP mildly elevated. Adding spiro. Likely switch losartan to [facility] soon. 5. DM2 6. Hypokalemia --Adding spiro. Stop K. Check today.  7. Probable  OSA --Will schedule sleep study after initial cardiology complete  Total time spent 45 minutes. Over half that time spent discussing above.    Glori Bickers, MD  11:38 AM

## 2016-11-15 NOTE — Progress Notes (Signed)
Site Area: RFV Site Prior to Removal: Level 0 Pressure Applied For:  20   minutes Manual: yes Patient Status During Pull: stable Post Pull Site: Level 0 Post Pull Instructions Given: YES Post Pull Pulses Present: palpable Dressing Applied: gauze and tegaderm  Bedrest begins @ 7543 till 1455 Comments:  Removed by  Verta Ellen, RN

## 2016-11-15 NOTE — Discharge Instructions (Signed)
Femoral Site Care °Refer to this sheet in the next few weeks. These instructions provide you with information about caring for yourself after your procedure. Your health care provider may also give you more specific instructions. Your treatment has been planned according to current medical practices, but problems sometimes occur. Call your health care provider if you have any problems or questions after your procedure. °What can I expect after the procedure? °After your procedure, it is typical to have the following: °· Bruising at the site that usually fades within 1-2 weeks. °· Blood collecting in the tissue (hematoma) that may be painful to the touch. It should usually decrease in size and tenderness within 1-2 weeks. °Follow these instructions at home: °· Take medicines only as directed by your health care provider. °· You may shower 24-48 hours after the procedure or as directed by your health care provider. Remove the bandage (dressing) and gently wash the site with plain soap and water. Pat the area dry with a clean towel. Do not rub the site, because this may cause bleeding. °· Do not take baths, swim, or use a hot tub until your health care provider approves. °· Check your insertion site every day for redness, swelling, or drainage. °· Do not apply powder or lotion to the site. °· Limit use of stairs to twice a day for the first 2-3 days or as directed by your health care provider. °· Do not squat for the first 2-3 days or as directed by your health care provider. °· Do not lift over 10 lb (4.5 kg) for 5 days after your procedure or as directed by your health care provider. °· Ask your health care provider when it is okay to: °¨ Return to work or school. °¨ Resume usual physical activities or sports. °¨ Resume sexual activity. °· Do not drive home if you are discharged the same day as the procedure. Have someone else drive you. °· You may drive 24 hours after the procedure unless otherwise instructed by  your health care provider. °· Do not operate machinery or power tools for 24 hours after the procedure or as directed by your health care provider. °· If your procedure was done as an outpatient procedure, which means that you went home the same day as your procedure, a responsible adult should be with you for the first 24 hours after you arrive home. °· Keep all follow-up visits as directed by your health care provider. This is important. °Contact a health care provider if: °· You have a fever. °· You have chills. °· You have increased bleeding from the site. Hold pressure on the site. °Get help right away if: °· You have unusual pain at the site. °· You have redness, warmth, or swelling at the site. °· You have drainage (other than a small amount of blood on the dressing) from the site. °· The site is bleeding, and the bleeding does not stop after 30 minutes of holding steady pressure on the site. °· Your leg or foot becomes pale, cool, tingly, or numb. °This information is not intended to replace advice given to you by your health care provider. Make sure you discuss any questions you have with your health care provider. °Document Released: 04/04/2014 Document Revised: 01/07/2016 Document Reviewed: 02/18/2014 °Elsevier Interactive Patient Education © 2017 [employer] ° °

## 2016-11-15 NOTE — Progress Notes (Signed)
Site Area: RFA  Site Prior to Removal: Level 0 Pressure Applied For:  20   minutes Manual: yes Patient Status During Pull: stable Post Pull Site: Level 0 Post Pull Instructions Given: YES Post Pull Pulses Present: palpable Dressing Applied: gauze and tegaderm  Bedrest begins @ see RFV note   Comments:  Removed by  Verta Ellen, RN

## 2016-11-16 ENCOUNTER — Encounter (HOSPITAL_COMMUNITY): Payer: Self-pay | Admitting: Internal Medicine

## 2016-11-16 MED FILL — Verapamil HCl IV Soln 2.5 MG/ML: INTRAVENOUS | Qty: 2 | Status: AC

## 2016-11-18 ENCOUNTER — Inpatient Hospital Stay (HOSPITAL_COMMUNITY)
Admission: EM | Admit: 2016-11-18 | Discharge: 2016-11-19 | DRG: 393 | Disposition: A | Discharge status: Home / Self Care | Payer: Medicare Other | Attending: Internal Medicine | Admitting: Internal Medicine

## 2016-11-18 ENCOUNTER — Telehealth: Payer: Self-pay | Admitting: Cardiology

## 2016-11-18 ENCOUNTER — Telehealth (HOSPITAL_COMMUNITY): Payer: Self-pay | Admitting: *Deleted

## 2016-11-18 ENCOUNTER — Encounter (HOSPITAL_COMMUNITY): Payer: Self-pay | Admitting: *Deleted

## 2016-11-18 DIAGNOSIS — K5900 Constipation, unspecified: Secondary | ICD-10-CM | POA: Diagnosis present

## 2016-11-18 DIAGNOSIS — I482 Chronic atrial fibrillation, unspecified: Secondary | ICD-10-CM

## 2016-11-18 DIAGNOSIS — R140 Abdominal distension (gaseous): Secondary | ICD-10-CM | POA: Diagnosis present

## 2016-11-18 DIAGNOSIS — M109 Gout, unspecified: Secondary | ICD-10-CM | POA: Diagnosis present

## 2016-11-18 DIAGNOSIS — R682 Dry mouth, unspecified: Secondary | ICD-10-CM | POA: Diagnosis present

## 2016-11-18 DIAGNOSIS — K5791 Diverticulosis of intestine, part unspecified, without perforation or abscess with bleeding: Secondary | ICD-10-CM | POA: Diagnosis present

## 2016-11-18 DIAGNOSIS — N183 Chronic kidney disease, stage 3 (moderate): Secondary | ICD-10-CM | POA: Diagnosis present

## 2016-11-18 DIAGNOSIS — N200 Calculus of kidney: Secondary | ICD-10-CM

## 2016-11-18 DIAGNOSIS — N400 Enlarged prostate without lower urinary tract symptoms: Secondary | ICD-10-CM | POA: Diagnosis present

## 2016-11-18 DIAGNOSIS — Z79899 Other long term (current) drug therapy: Secondary | ICD-10-CM

## 2016-11-18 DIAGNOSIS — K649 Unspecified hemorrhoids: Principal | ICD-10-CM | POA: Diagnosis present

## 2016-11-18 DIAGNOSIS — D72829 Elevated white blood cell count, unspecified: Secondary | ICD-10-CM | POA: Diagnosis present

## 2016-11-18 DIAGNOSIS — E1122 Type 2 diabetes mellitus with diabetic chronic kidney disease: Secondary | ICD-10-CM | POA: Diagnosis present

## 2016-11-18 DIAGNOSIS — R1310 Dysphagia, unspecified: Secondary | ICD-10-CM | POA: Diagnosis present

## 2016-11-18 DIAGNOSIS — I5022 Chronic systolic (congestive) heart failure: Secondary | ICD-10-CM | POA: Diagnosis present

## 2016-11-18 DIAGNOSIS — K921 Melena: Secondary | ICD-10-CM | POA: Diagnosis present

## 2016-11-18 DIAGNOSIS — R609 Edema, unspecified: Secondary | ICD-10-CM

## 2016-11-18 DIAGNOSIS — Z885 Allergy status to narcotic agent status: Secondary | ICD-10-CM

## 2016-11-18 DIAGNOSIS — I4891 Unspecified atrial fibrillation: Secondary | ICD-10-CM | POA: Diagnosis present

## 2016-11-18 DIAGNOSIS — Z7901 Long term (current) use of anticoagulants: Secondary | ICD-10-CM | POA: Diagnosis not present

## 2016-11-18 DIAGNOSIS — K922 Gastrointestinal hemorrhage, unspecified: Secondary | ICD-10-CM | POA: Diagnosis present

## 2016-11-18 DIAGNOSIS — I272 Pulmonary hypertension, unspecified: Secondary | ICD-10-CM | POA: Diagnosis present

## 2016-11-18 LAB — COMPREHENSIVE METABOLIC PANEL WITH GFR
ALT: 12 U/L — ABNORMAL LOW (ref 17–63)
AST: 20 U/L (ref 15–41)
Albumin: 3.2 g/dL — ABNORMAL LOW (ref 3.5–5.0)
Alkaline Phosphatase: 52 U/L (ref 38–126)
Anion gap: 11 (ref 5–15)
BUN: 25 mg/dL — ABNORMAL HIGH (ref 6–20)
CO2: 29 mmol/L (ref 22–32)
Calcium: 9 mg/dL (ref 8.9–10.3)
Chloride: 97 mmol/L — ABNORMAL LOW (ref 101–111)
Creatinine, Ser: 1.25 mg/dL — ABNORMAL HIGH (ref 0.61–1.24)
GFR calc Af Amer: >60 mL/min
GFR calc non Af Amer: 53 mL/min — ABNORMAL LOW
Glucose, Bld: 143 mg/dL — ABNORMAL HIGH (ref 65–99)
Potassium: 3.8 mmol/L (ref 3.5–5.1)
Sodium: 137 mmol/L (ref 135–145)
Total Bilirubin: 0.8 mg/dL (ref 0.3–1.2)
Total Protein: 6.5 g/dL (ref 6.5–8.1)

## 2016-11-18 LAB — CBC
HCT: 40.8 % (ref 39.0–52.0)
Hemoglobin: 13.6 g/dL (ref 13.0–17.0)
MCH: 28.9 pg (ref 26.0–34.0)
MCHC: 33.3 g/dL (ref 30.0–36.0)
MCV: 86.8 fL (ref 78.0–100.0)
Platelets: 247 K/uL (ref 150–400)
RBC: 4.7 MIL/uL (ref 4.22–5.81)
RDW: 15.2 % (ref 11.5–15.5)
WBC: 14.6 K/uL — ABNORMAL HIGH (ref 4.0–10.5)

## 2016-11-18 LAB — POC OCCULT BLOOD, ED: Fecal Occult Bld: POSITIVE — AB

## 2016-11-18 LAB — TYPE AND SCREEN
ABO/RH(D): O POS
ANTIBODY SCREEN: NEGATIVE

## 2016-11-18 MED ORDER — POTASSIUM CHLORIDE ER 20 MEQ PO TBCR: 20.0000 meq | EXTENDED_RELEASE_TABLET | Freq: Every day | ORAL | 3 refills | Status: DC

## 2016-11-18 NOTE — H&P (Addendum)
History and Physical    Ricardo [name] LAG:[id number] DOB: 04/12/1936 DOA: 11/18/2016  PCP: Thressa Sheller, MD  Patient coming from: Home  Chief Complaint: rectal bleeding  HPI: Ricardo [name] is a 81 y.o. male with medical history significant of Afib, BPH, gout, kidney stones, DM2, CHF with last EF reported at 40% on TTE, hemorrhoids who presents for rectal bleeding. [name] has had a few procedures recently.  He was seen by Dr. Ola Spurr on 3/21 for Afib and swelling at [facility] who recommended he have a TTE. He was admitted for an overnight stay on 3/24 for acute CHF and he had the TTE on 3/25 which showed EF of 40%.  He was discharged that same day.  He was seen by Dr. Haroldine Laws in [place] [facility] on 3/28 who recommended cardiac MRI, R/L heart catheterization for restrictive pathology seen on TTE.  R/L HC were done on 4/3 this week, showing heavily calcified coronary arteries, EF of 45%, mild pulmonary HTN.  Plan was to proceed with medical management and proceed with cardiac MRI which has not occurred yet.  Over the course of his procedures, he was discontinued from his chronic coumadin in anticipation of the procedures.  He takes this medication for Afib.  He reports no history of VTE.  After his heart catheterization he was started on Eliquis due to more liberalized diet and not having to get INR checks.  He took this medication starting Wednesday of this week.  He was not clear that he was supposed to take it twice a day, so he only took it once a day for 3 days.  He also had constipation this week and took [place] yesterday evening.  This afternoon he developed a gassy/bloating feeling and had 3 bloody bowel movements with bright red blood and clots.  He denies chest pain, dizziness, abdominal pain, fever, chills, SOB.  He has had a colonoscopy in 2013 which showed moderate diverticulosis throughout the bowel and no adenomas.  He has had one diverticular bleed in the past, the same year as the  colonoscopy.   He does report incidental swallowing difficulty and some dry mouth.  We discussed a swallowing evaluation, but he would rather do that outpatient.  He has no globus sensation, no food getting stuck sensation, no regurgitation.  Main symptom is sometimes having to drink water to get chewed food to go down.  It sounds like this is more related to dry mouth.    ED Course: In the ED, he was noted to have a Hgb of 13.6, HCT of 40.8.  He had a WBC of 14.6, which appears chronically elevated.  Hemocult was positive.    Review of Systems: As per HPI otherwise 10 point review of systems negative.    Past Medical History:  Diagnosis Date  . Atrial fibrillation (HCC)    on coumadin.  cardiologist is in [facility] dr [name].  S/P multiple DCCVs and prior ablation.   Marland Kitchen BPH (benign prostatic hyperplasia)    by ST in 2000.   . Diverticulitis    question if he ever had imaging confirmed diverticulitis  . Gout   . Kidney stones    treated with lithotripsy   . Rupture of biceps tendon 2006   left distal bicep tendon    Past Surgical History:  Procedure Laterality Date  . CHOLECYSTECTOMY    . COLONOSCOPY  04/18/2012   Procedure: COLONOSCOPY;  Surgeon: [name], MD;  Location: [place];  Service:  Endoscopy;  Laterality: N/A;  . DISTAL BICEPS TENDON REPAIR  2006   dr [name]  . HEMORRHOID SURGERY     twice  . LITHOTRIPSY     of kidney stones.   Marland Kitchen RIGHT/LEFT HEART CATH AND CORONARY ANGIOGRAPHY N/A 11/15/2016   Procedure: Right/Left Heart Cath and Coronary Angiography;  Surgeon: [name], MD;  Location: [place] [facility];  Service: Cardiovascular;  Laterality: N/A;   Reviewed with patient.   reports that he has never smoked. He has never used smokeless tobacco. He reports that he does not drink alcohol or use drugs.  Allergies  Allergen Reactions  . Codeine Other (See Comments)    "Gives me headaches"   Reviewed with patient.  Family History    Problem Relation Age of Onset  . Tuberculosis Mother   . Stroke Father   . Congestive Heart Failure Father     Prior to Admission medications   Medication Sig Start Date End Date Taking? Authorizing Provider  apixaban (ELIQUIS) 2.5 MG TABS tablet Take 1 tablet (2.5 mg total) by mouth 2 (two) times daily. 11/15/16   [name], MD  calcium carbonate (TUMS - DOSED IN MG ELEMENTAL CALCIUM) 500 MG chewable tablet Chew 2 tablets by mouth daily as needed for indigestion or heartburn.    Historical Provider, MD  Cholecalciferol (VITAMIN D3) 2000 units capsule Take 2,000 Units by mouth daily.    Historical Provider, MD  CINNAMON PO Take 1,000 mg by mouth daily.    Historical Provider, MD  Coenzyme Q10 (COQ-10) 200 MG CAPS Take 200 mg by mouth daily.    Historical Provider, MD  digoxin (LANOXIN) 0.125 MG tablet Take 0.125 mg by mouth daily.    Historical Provider, MD  empagliflozin (JARDIANCE) 10 MG TABS tablet Take 10 mg by mouth daily.    Historical Provider, MD  furosemide (LASIX) 40 MG tablet Take 0.5 tablets (20 mg total) by mouth daily. Can take extra half tab as needed Patient taking differently: Take 20 mg by mouth daily. Can take extra half tab as needed for swelling 11/09/16   [name], MD  losartan (COZAAR) 25 MG tablet Take 25 mg by mouth daily.    Historical Provider, MD  metoprolol succinate (TOPROL-XL) 25 MG 24 hr tablet Take 1 tablet (25 mg total) by mouth daily. 11/06/16   Patrecia Pour, MD  oxymetazoline (AFRIN) 0.05 % nasal spray Place 1 spray into both nostrils 2 (two) times daily as needed for congestion.    Historical Provider, MD  pantoprazole (PROTONIX) 40 MG tablet Take 1 tablet (40 mg total) by mouth daily. 11/09/16   [name], MD  potassium chloride 20 MEQ TBCR Take 20 mEq by mouth daily. 11/18/16   Shirley Friar, PA-C  spironolactone (ALDACTONE) 25 MG tablet Take 1 tablet (25 mg total) by mouth daily. 11/09/16 02/07/17  [name], MD   SUPER B COMPLEX/C PO Take 1 tablet by mouth daily.    Historical Provider, MD  vitamin B-12 (CYANOCOBALAMIN) 1000 MCG tablet Take 1,000 mcg by mouth daily.    Historical Provider, MD  vitamin C (ASCORBIC ACID) 500 MG tablet Take 500 mg by mouth daily.    Historical Provider, MD  warfarin (COUMADIN) 3 MG tablet Take 3 mg by mouth at bedtime.     Historical Provider, MD    Physical Exam: Vitals:   11/18/16 2039 11/18/16 2122 11/18/16 2200 11/18/16 2230  BP:  (!) 147/104 (!) 150/88 125/88  Pulse:  91 (!) 41 91  Resp:  19 (!) 23 (!) 26  Temp:      SpO2:  93% 95% 96%  Weight: 148 lb (67.1 kg)     Height: 5\' 6"  (1.676 m)       Constitutional: Elderly man, lying in bed, NAD Vitals:   11/18/16 2039 11/18/16 2122 11/18/16 2200 11/18/16 2230  BP:  (!) 147/104 (!) 150/88 125/88  Pulse:  91 (!) 41 91  Resp:  19 (!) 23 (!) 26  Temp:      SpO2:  93% 95% 96%  Weight: 148 lb (67.1 kg)     Height: 5\' 6"  (1.676 m)      Eyes: PERRL, no conjunctival pallor ENMT: Mucous membranes are somewhat dry. Posterior pharynx clear of any exudate or lesions, he has had his uvula removed.  Normal dentition.  Neck: normal, supple Respiratory: CTAB, no wheezing, no crackles. Normal respiratory effort.  Cardiovascular: Regular rate and Irreg Irreg rhythm, no murmurs / rubs / gallops. 2+ edema bilaterally, to mid calf on the right and to ankle on the left.  His groin access site has a small bruise distal to site and some tenderness to palpation.   Abdomen: NT, ND, +BS Musculoskeletal: no clubbing / cyanosis.  Normal muscle tone.  Skin: no rashes, lesions, ulcers. No petechiae Neurologic: He has somewhat asymmetric mouth at rest, but symmetric smile.  His strength is 5/5 throughout.   Psychiatric: Normal judgment and insight. Alert and oriented x 3. Normal mood.  GU:  Rectal exam with bright blood, hemorrhoid at 9 o'clock position   Labs on Admission: I have personally reviewed following labs and imaging  studies  CBC:  Recent Labs Lab 11/18/16 2046  WBC 14.6*  HGB 13.6  HCT 40.8  MCV 86.8  PLT 737   Basic Metabolic Panel:  Recent Labs Lab 11/15/16 0745 11/18/16 2046  NA 134* 137  K 3.3* 3.8  CL 94* 97*  CO2 28 29  GLUCOSE 126* 143*  BUN 24* 25*  CREATININE 1.43* 1.25*  CALCIUM 9.1 9.0   GFR: Estimated Creatinine Clearance: 43.2 mL/min (A) (by C-G formula based on SCr of 1.25 mg/dL (H)). Liver Function Tests:  Recent Labs Lab 11/18/16 2046  AST 20  ALT 12*  ALKPHOS 52  BILITOT 0.8  PROT 6.5  ALBUMIN 3.2*   No results for input(s): LIPASE, AMYLASE in the last 168 hours. No results for input(s): AMMONIA in the last 168 hours. Coagulation Profile:  Recent Labs Lab 11/15/16 0735  INR 1.70   Cardiac Enzymes: No results for input(s): CKTOTAL, CKMB, CKMBINDEX, TROPONINI in the last 168 hours. BNP (last 3 results) No results for input(s): PROBNP in the last 8760 hours. HbA1C: No results for input(s): HGBA1C in the last 72 hours. CBG: No results for input(s): GLUCAP in the last 168 hours. Lipid Profile: No results for input(s): CHOL, HDL, LDLCALC, TRIG, CHOLHDL, LDLDIRECT in the last 72 hours. Thyroid Function Tests: No results for input(s): TSH, T4TOTAL, FREET4, T3FREE, THYROIDAB in the last 72 hours. Anemia Panel: No results for input(s): VITAMINB12, FOLATE, FERRITIN, TIBC, IRON, RETICCTPCT in the last 72 hours. Urine analysis: No results found for: COLORURINE, APPEARANCEUR, LABSPEC, PHURINE, GLUCOSEU, HGBUR, BILIRUBINUR, KETONESUR, PROTEINUR, UROBILINOGEN, NITRITE, LEUKOCYTESUR  Radiological Exams on Admission: No results found.  EKG: Independently reviewed. Afib, PVC  Assessment/Plan Lower GI bleed - DDx includes diverticular bleeding, hemorrhoidal bleeding vs. Other lower GI source - Consider GI consult in the AM if bleeding persistent.   -  Last colonoscopy in 2013 without any polyps - Trend CBC overnight to monitor for acute change, initial  Hgb 13 - Transfuse for Hgb < 8 given significant coronary disease on last LHC - BP stable, will give low dose of IVF overnight given CHF - Hold anticoagulation - SCDs for VTE PPx - Telemetry    Atrial fibrillation on Chronic anticoagulation - On Eliquis for 3 days only, holding for now - patient would prefer to go back on coumadin in future once bleeding stops - Telemetry - Continue digoxin, metoprolol    Chronic systolic HF with edema - Following with Dr. Haroldine Laws and work up is ongoing, he is due to get a cardiac MRI in the near future - Hold lasix overnight, restart in the AM - Residual swelling in feet noted - Hold losartan given acute bleeding, continue metoprolol with hold parameters - Daily weights, strict I/O  Swallowing issue, dry mouth - Offered swallowing evaluation, and he would rather do this outpatient  CKD stage 2-3 - Baseline seems to be about 1.2 - 1.3 which is where he is currently - Trend Cr  DM2 - Check A1C, hold jardiance in acute setting - SSI  Leukocytosis - Appears chronic based on chart review, no signs/symptoms of infection, no fever - Add on differential - Trend     DVT prophylaxis: SCDs Code Status: Full Family Communication: Wife Marlowe Kays at bedside Disposition Plan: Likely discharge in 1-2 days Consults called: None, consider GI if rebleeds Admission status: Inpatient, telemetry   Gilles Chiquito MD [facility] Pager [phone]  If 7PM-7AM, please contact night-coverage [email or web address] Password [email or web address]  11/18/2016, 11:03 PM

## 2016-11-18 NOTE — Telephone Encounter (Signed)
Patient's wife called in to report that his weight is up 2 lbs in the last couple days.  Also was concerned about his potassium being low when checked a few days ago.    I spoke with Oda Kilts, PA and he advises patient to start taking potassium chloride 20 mEq Daily and since patient is not complaining of swelling or SOB to weight and see what his weight is tomorrow and if it is still up he may take an extra 10 mg of lasix.  Also wants him to come in for lab work next week.    Pt's wife is agreeable with plan.  Potassium and BMET ordered and lab appt made. No further questions at this time.

## 2016-11-18 NOTE — ED Triage Notes (Signed)
He took a laxative last pm because he had not had a bm for 2-3 days

## 2016-11-18 NOTE — Telephone Encounter (Signed)
   Pt with h/o CAD, HF and Afib on Eliquis. His wife called after hours stating that he has had rectal bleeding x 2. Large amounts. He feels weak and tired. Pt advised to go to ED for evaluation and labs to r/o significant acute blood loss anemia. Wife is in agreement. They will go to ED.   Nishika Parkhurst 11/18/2016 7.22 pm

## 2016-11-18 NOTE — ED Provider Notes (Signed)
[facility] DEPT Provider Note   CSN: [id number] Arrival date & time: 11/18/16  2021     History   Chief Complaint Chief Complaint  Patient presents with  . Rectal Bleeding    HPI Ricardo [name] is a 81 y.o. male.  HPI Pt comes in with cc of rectal bleed. Pt has hx of AF on eliquis and also has hx of diverticulosis. Pt started having bloody stools at 6:30 pm. Pt has had 3 loose BM, with blood in them since then. Pt has no abd pain.    Past Medical History:  Diagnosis Date  . Atrial fibrillation (HCC)    on coumadin.  cardiologist is in [facility] dr [name].  S/P multiple DCCVs and prior ablation.   Marland Kitchen BPH (benign prostatic hyperplasia)    by ST in 2000.   . Diverticulitis    question if he ever had imaging confirmed diverticulitis  . Gout   . Kidney stones    treated with lithotripsy   . Rupture of biceps tendon 2006   left distal bicep tendon    Patient Active Problem List   Diagnosis Date Noted  . Acute on chronic congestive heart failure ([place]) 11/05/2016  . Atrial fibrillation with rapid ventricular response ([place]) 11/05/2016  . Orthopnea 11/05/2016  . Edema 11/05/2016  . Acute pulmonary edema ([place]) 11/05/2016  . Diverticulosis 04/18/2012  . Chronic anticoagulation 04/17/2012  . Atrial fibrillation ([place])   . Diverticulitis   . Kidney stones   . BPH (benign prostatic hyperplasia)     Past Surgical History:  Procedure Laterality Date  . CHOLECYSTECTOMY    . COLONOSCOPY  04/18/2012   Procedure: COLONOSCOPY;  Surgeon: [name], MD;  Location: [place];  Service: Endoscopy;  Laterality: N/A;  . DISTAL BICEPS TENDON REPAIR  2006   dr [name]  . HEMORRHOID SURGERY     twice  . LITHOTRIPSY     of kidney stones.   Marland Kitchen RIGHT/LEFT HEART CATH AND CORONARY ANGIOGRAPHY N/A 11/15/2016   Procedure: Right/Left Heart Cath and Coronary Angiography;  Surgeon: [name], MD;  Location: [place] [facility];  Service: Cardiovascular;  Laterality:  N/A;       Home Medications    Prior to Admission medications   Medication Sig Start Date End Date Taking? Authorizing Provider  apixaban (ELIQUIS) 2.5 MG TABS tablet Take 1 tablet (2.5 mg total) by mouth 2 (two) times daily. 11/15/16  Yes [name], MD  calcium carbonate (TUMS - DOSED IN MG ELEMENTAL CALCIUM) 500 MG chewable tablet Chew 2 tablets by mouth daily as needed for indigestion or heartburn.   Yes Historical Provider, MD  Cholecalciferol (VITAMIN D3) 2000 units capsule Take 2,000 Units by mouth daily.   Yes Historical Provider, MD  CINNAMON PO Take 1,000 mg by mouth daily.   Yes Historical Provider, MD  Coenzyme Q10 (COQ-10) 200 MG CAPS Take 200 mg by mouth daily.   Yes Historical Provider, MD  digoxin (LANOXIN) 0.125 MG tablet Take 0.125 mg by mouth daily.   Yes Historical Provider, MD  empagliflozin (JARDIANCE) 10 MG TABS tablet Take 10 mg by mouth daily.   Yes Historical Provider, MD  furosemide (LASIX) 40 MG tablet Take 0.5 tablets (20 mg total) by mouth daily. Can take extra half tab as needed Patient taking differently: Take 20 mg by mouth daily. Can take extra half tab as needed for swelling 11/09/16  Yes [name], MD  losartan (COZAAR) 25 MG tablet  Take 25 mg by mouth daily.   Yes Historical Provider, MD  metoprolol succinate (TOPROL-XL) 25 MG 24 hr tablet Take 1 tablet (25 mg total) by mouth daily. 11/06/16  Yes Patrecia Pour, MD  potassium chloride 20 MEQ TBCR Take 20 mEq by mouth daily. 11/18/16  Yes Shirley Friar, PA-C  spironolactone (ALDACTONE) 25 MG tablet Take 1 tablet (25 mg total) by mouth daily. 11/09/16 02/07/17 Yes Shaune Pascal Bensimhon, MD  SUPER B COMPLEX/C PO Take 1 tablet by mouth daily.   Yes Historical Provider, MD  triamcinolone (NASACORT ALLERGY 24HR) 55 MCG/ACT AERO nasal inhaler Place 2 sprays into the nose 2 (two) times daily.   Yes Historical Provider, MD  vitamin B-12 (CYANOCOBALAMIN) 1000 MCG tablet Take 1,000 mcg by mouth daily.    Yes Historical Provider, MD  vitamin C (ASCORBIC ACID) 500 MG tablet Take 500 mg by mouth daily.   Yes Historical Provider, MD    Family History No family history on file.  Social History Social History  Substance Use Topics  . Smoking status: Never Smoker  . Smokeless tobacco: Never Used  . Alcohol use No     Allergies   Codeine   Review of Systems Review of Systems  Gastrointestinal: Positive for blood in stool.  Hematological: Bruises/bleeds easily.  All other systems reviewed and are negative.     Physical Exam Updated Vital Signs BP (!) 150/88   Pulse (!) 41   Temp 97.5 F (36.4 C)   Resp (!) 23   Ht 5\' 6"  (1.676 m)   Wt 148 lb (67.1 kg)   SpO2 95%   BMI 23.89 kg/m   Physical Exam  Constitutional: He is oriented to person, place, and time. He appears well-developed.  HENT:  Head: Atraumatic.  Neck: Neck supple.  Cardiovascular: Normal rate.   Pulmonary/Chest: Effort normal.  Abdominal: Soft.  DRE reveals grossly bloody stool  Neurological: He is alert and oriented to person, place, and time.  Skin: Skin is warm.  Nursing note and vitals reviewed.    ED Treatments / Results  Labs (all labs ordered are listed, but only abnormal results are displayed) Labs Reviewed  COMPREHENSIVE METABOLIC PANEL - Abnormal; Notable for the following:       Result Value   Chloride 97 (*)    Glucose, Bld 143 (*)    BUN 25 (*)    Creatinine, Ser 1.25 (*)    Albumin 3.2 (*)    ALT 12 (*)    GFR calc non Af Amer 53 (*)    All other components within normal limits  CBC - Abnormal; Notable for the following:    WBC 14.6 (*)    All other components within normal limits  POC OCCULT BLOOD, ED - Abnormal; Notable for the following:    Fecal Occult Bld POSITIVE (*)    All other components within normal limits  TYPE AND SCREEN    EKG  EKG Interpretation None       Radiology No results found.  Procedures Procedures (including critical care  time)  Medications Ordered in ED Medications - No data to display   Initial Impression / Assessment and Plan / ED Course  I have reviewed the triage vital signs and the nursing notes.  Pertinent labs & imaging results that were available during my care of the patient were reviewed by me and considered in my medical decision making (see chart for details).     Pt comes in with  cc of bloody stools. PT has hx of divertivculosis, and it seems like he is having diverticular bleed. With eliquis on board - we will admit. GI team - Dr.Mann has not been consulted for this encounter from the ER.  Final Clinical Impressions(s) / ED Diagnoses   Final diagnoses:  Acute lower GI bleeding  Hematochezia    New Prescriptions New Prescriptions   No medications on file     Varney Biles, MD 11/18/16 2251

## 2016-11-18 NOTE — ED Notes (Signed)
Admitting team at bedside.

## 2016-11-18 NOTE — ED Triage Notes (Signed)
The pt has had bright red rectal bleeding tonight  He has had small clots.  Painful when he has a bm  He was placed on eliquist since Wednesday.  3 days before this he was on coumadin but had not had any for 3-4 days due to a procedure

## 2016-11-19 DIAGNOSIS — I5022 Chronic systolic (congestive) heart failure: Secondary | ICD-10-CM | POA: Diagnosis present

## 2016-11-19 DIAGNOSIS — K921 Melena: Secondary | ICD-10-CM

## 2016-11-19 LAB — GLUCOSE, CAPILLARY
GLUCOSE-CAPILLARY: 115 mg/dL — AB (ref 65–99)
GLUCOSE-CAPILLARY: 119 mg/dL — AB (ref 65–99)
Glucose-Capillary: 131 mg/dL — ABNORMAL HIGH (ref 65–99)

## 2016-11-19 LAB — DIFFERENTIAL
BASOS ABS: 0 10*3/uL (ref 0.0–0.1)
BASOS PCT: 0 %
BASOS PCT: 0 %
Basophils Absolute: 0 10*3/uL (ref 0.0–0.1)
Eosinophils Absolute: 0.2 10*3/uL (ref 0.0–0.7)
Eosinophils Absolute: 0.2 10*3/uL (ref 0.0–0.7)
Eosinophils Relative: 1 %
Eosinophils Relative: 2 %
LYMPHS ABS: 1 10*3/uL (ref 0.7–4.0)
LYMPHS PCT: 6 %
Lymphocytes Relative: 8 %
Lymphs Abs: 0.9 10*3/uL (ref 0.7–4.0)
MONO ABS: 1.1 10*3/uL — AB (ref 0.1–1.0)
MONOS PCT: 8 %
Monocytes Absolute: 1.4 10*3/uL — ABNORMAL HIGH (ref 0.1–1.0)
Monocytes Relative: 10 %
NEUTROS ABS: 12 10*3/uL — AB (ref 1.7–7.7)
NEUTROS ABS: 10.8 10*3/uL — AB (ref 1.7–7.7)
NEUTROS PCT: 83 %
Neutrophils Relative %: 82 %

## 2016-11-19 LAB — BASIC METABOLIC PANEL
Anion gap: 7 (ref 5–15)
BUN: 22 mg/dL — AB (ref 6–20)
CHLORIDE: 101 mmol/L (ref 101–111)
CO2: 29 mmol/L (ref 22–32)
CREATININE: 1.13 mg/dL (ref 0.61–1.24)
Calcium: 8.5 mg/dL — ABNORMAL LOW (ref 8.9–10.3)
GFR calc Af Amer: >60 mL/min (ref >60)
GFR calc non Af Amer: 60 mL/min — ABNORMAL LOW (ref >60)
Glucose, Bld: 110 mg/dL — ABNORMAL HIGH (ref 65–99)
POTASSIUM: 3.7 mmol/L (ref 3.5–5.1)
SODIUM: 137 mmol/L (ref 135–145)

## 2016-11-19 LAB — CBC
HEMATOCRIT: 35.8 % — AB (ref 39.0–52.0)
HEMATOCRIT: 35.4 % — AB (ref 39.0–52.0)
HEMOGLOBIN: 11.7 g/dL — AB (ref 13.0–17.0)
HEMOGLOBIN: 11.7 g/dL — AB (ref 13.0–17.0)
MCH: 28.1 pg (ref 26.0–34.0)
MCH: 28.6 pg (ref 26.0–34.0)
MCHC: 32.7 g/dL (ref 30.0–36.0)
MCHC: 33.1 g/dL (ref 30.0–36.0)
MCV: 86.1 fL (ref 78.0–100.0)
MCV: 86.6 fL (ref 78.0–100.0)
Platelets: 223 10*3/uL (ref 150–400)
Platelets: 221 10*3/uL (ref 150–400)
RBC: 4.16 MIL/uL — AB (ref 4.22–5.81)
RBC: 4.09 MIL/uL — ABNORMAL LOW (ref 4.22–5.81)
RDW: 15.3 % (ref 11.5–15.5)
RDW: 15.4 % (ref 11.5–15.5)
WBC: 13.1 10*3/uL — AB (ref 4.0–10.5)
WBC: 13 10*3/uL — ABNORMAL HIGH (ref 4.0–10.5)

## 2016-11-19 LAB — PROTIME-INR
INR: 1.37
PROTHROMBIN TIME: 17 s — AB (ref 11.4–15.2)

## 2016-11-19 MED ORDER — VITAMIN C 500 MG PO TABS
500.0000 mg | ORAL_TABLET | Freq: Every day | ORAL | Status: DC
  Administered 2016-11-19: 500 mg via ORAL
  Filled 2016-11-19: qty 1

## 2016-11-19 MED ORDER — FUROSEMIDE 20 MG PO TABS
20.0000 mg | ORAL_TABLET | Freq: Every day | ORAL | Status: DC
Start: 2016-11-19 — End: 2016-11-19
  Administered 2016-11-19: 20 mg via ORAL
  Filled 2016-11-19: qty 1

## 2016-11-19 MED ORDER — SPIRONOLACTONE 25 MG PO TABS
25.0000 mg | ORAL_TABLET | Freq: Every day | ORAL | Status: DC
  Administered 2016-11-19: 25 mg via ORAL
  Filled 2016-11-19: qty 1

## 2016-11-19 MED ORDER — HYDROCORTISONE ACETATE 25 MG RE SUPP: 25.0000 mg | Freq: Two times a day (BID) | RECTAL | Status: DC

## 2016-11-19 MED ORDER — WARFARIN SODIUM 3 MG PO TABS: 3.0000 mg | ORAL_TABLET | Freq: Every day | ORAL | 0 refills | Status: DC

## 2016-11-19 MED ORDER — POTASSIUM CHLORIDE CRYS ER 20 MEQ PO TBCR
20.0000 meq | EXTENDED_RELEASE_TABLET | Freq: Every day | ORAL | Status: DC
  Administered 2016-11-19: 20 meq via ORAL
  Filled 2016-11-19: qty 1

## 2016-11-19 MED ORDER — CINNAMON 500 MG PO CAPS: 1000.0000 mg | ORAL_CAPSULE | Freq: Every day | ORAL | Status: DC

## 2016-11-19 MED ORDER — VITAMIN D 1000 UNITS PO TABS
2000.0000 [IU] | ORAL_TABLET | Freq: Every day | ORAL | Status: DC
  Administered 2016-11-19: 2000 [IU] via ORAL
  Filled 2016-11-19: qty 2

## 2016-11-19 MED ORDER — INSULIN ASPART 100 UNIT/ML ~~LOC~~ SOLN
0.0000 [IU] | SUBCUTANEOUS | Status: DC
  Administered 2016-11-19: 1 [IU] via SUBCUTANEOUS

## 2016-11-19 MED ORDER — METOPROLOL SUCCINATE ER 25 MG PO TB24
25.0000 mg | ORAL_TABLET | Freq: Every day | ORAL | Status: DC
  Administered 2016-11-19: 25 mg via ORAL
  Filled 2016-11-19: qty 1

## 2016-11-19 MED ORDER — TRIAMCINOLONE ACETONIDE 55 MCG/ACT NA AERO
2.0000 | INHALATION_SPRAY | Freq: Two times a day (BID) | NASAL | Status: DC
  Administered 2016-11-19: 2 via NASAL
  Filled 2016-11-19: qty 21.6

## 2016-11-19 MED ORDER — COQ-10 200 MG PO CAPS: 200.0000 mg | ORAL_CAPSULE | Freq: Every day | ORAL | Status: DC

## 2016-11-19 MED ORDER — DIGOXIN 125 MCG PO TABS
0.1250 mg | ORAL_TABLET | Freq: Every day | ORAL | Status: DC
  Administered 2016-11-19: 0.125 mg via ORAL
  Filled 2016-11-19: qty 1

## 2016-11-19 MED ORDER — HYDROCORTISONE ACETATE 25 MG RE SUPP: 25.0000 mg | Freq: Two times a day (BID) | RECTAL | 0 refills | Status: DC

## 2016-11-19 NOTE — Progress Notes (Signed)
PHARMACIST - PHYSICIAN ORDER COMMUNICATION  CONCERNING: P&T Medication Policy on Herbal Medications  DESCRIPTION:  This patient's orders for:  Cinnamon and CoQ10  have been noted.  This product(s) is classified as an "herbal" or natural product. Due to a lack of definitive safety studies or FDA approval, nonstandard manufacturing practices, plus the potential risk of unknown drug-drug interactions while on inpatient medications, the Pharmacy and Therapeutics Committee does not permit the use of "herbal" or natural products of this type within [facility].   ACTION TAKEN: The pharmacy department is unable to verify this order at this time and your patient has been informed of this safety policy. Please reevaluate patient's clinical condition at discharge and address if the herbal or natural product(s) should be resumed at that time.

## 2016-11-19 NOTE — Progress Notes (Signed)
Nsg Discharge Note  Admit Date:  11/18/2016 Discharge date: 11/19/2016   Odette Horns to be D/C'd Home per MD order.  AVS completed.  Copy for chart, and copy for patient signed, and dated. Patient/caregiver able to verbalize understanding.  Discharge Medication: Allergies as of 11/19/2016      Reactions   Codeine Other (See Comments)   "Gives me headaches"      Medication List    STOP taking these medications   apixaban 2.5 MG Tabs tablet Commonly known as:  ELIQUIS   vitamin C 500 MG tablet Commonly known as:  ASCORBIC ACID     TAKE these medications   calcium carbonate 500 MG chewable tablet Commonly known as:  TUMS - dosed in mg elemental calcium Chew 2 tablets by mouth daily as needed for indigestion or heartburn.   CINNAMON PO Take 1,000 mg by mouth daily.   CoQ-10 200 MG Caps Take 200 mg by mouth daily.   digoxin 0.125 MG tablet Commonly known as:  LANOXIN Take 0.125 mg by mouth daily.   furosemide 40 MG tablet Commonly known as:  LASIX Take 0.5 tablets (20 mg total) by mouth daily. Can take extra half tab as needed What changed:  additional instructions   hydrocortisone 25 MG suppository Commonly known as:  ANUSOL-HC Place 1 suppository (25 mg total) rectally 2 (two) times daily.   JARDIANCE 10 MG Tabs tablet Generic drug:  empagliflozin Take 10 mg by mouth daily.   losartan 25 MG tablet Commonly known as:  COZAAR Take 25 mg by mouth daily.   metoprolol succinate 25 MG 24 hr tablet Commonly known as:  TOPROL-XL Take 1 tablet (25 mg total) by mouth daily.   NASACORT ALLERGY 24HR 55 MCG/ACT Aero nasal inhaler Generic drug:  triamcinolone Place 2 sprays into the nose 2 (two) times daily.   Potassium Chloride ER 20 MEQ Tbcr Take 20 mEq by mouth daily.   spironolactone 25 MG tablet Commonly known as:  ALDACTONE Take 1 tablet (25 mg total) by mouth daily.   SUPER B COMPLEX/C PO Take 1 tablet by mouth daily.   vitamin B-12 1000 MCG  tablet Commonly known as:  CYANOCOBALAMIN Take 1,000 mcg by mouth daily.   Vitamin D3 2000 units capsule Take 2,000 Units by mouth daily.   warfarin 3 MG tablet Commonly known as:  COUMADIN Take 1 tablet (3 mg total) by mouth daily.       Discharge Assessment: Vitals:   11/19/16 0525 11/19/16 0931  BP: 125/78 129/82  Pulse: 89 89  Resp: 16   Temp: 97.9 F (36.6 C)    Skin clean, dry and intact without evidence of skin break down, no evidence of skin tears noted. IV catheter discontinued intact. Site without signs and symptoms of complications - no redness or edema noted at insertion site, patient denies c/o pain - only slight tenderness at site.  Dressing with slight pressure applied.  D/c Instructions-Education: Discharge instructions given to patient/family with verbalized understanding. D/c education completed with patient/family including follow up instructions, medication list, d/c activities limitations if indicated, with other d/c instructions as indicated by MD - patient able to verbalize understanding, all questions fully answered. Patient instructed to return to ED, call 911, or call MD for any changes in condition.  Patient escorted via [place], and D/C home via private auto.  Salley Slaughter, RN 11/19/2016 2:26 PM

## 2016-11-19 NOTE — Progress Notes (Signed)
Pt was admitted from ED per stretcher accompanied by a nurse tech and pt wife, he was alert and oriented on arrival, self introduced to pt ID bracelet checked, oriented to his room and patient care equipments, skin assessment,  fall prevention and assessment done call light phone within reach pt able to demonstrate how to use them, will continue to monitor pt

## 2016-11-19 NOTE — Progress Notes (Signed)
Pt had small bowel movement which did have some bright red blood in it. Blood surrounding stool as well as in bottom of toilet.

## 2016-11-20 LAB — HEMOGLOBIN A1C
Hgb A1c MFr Bld: 6.7 % — ABNORMAL HIGH (ref 4.8–5.6)
Mean Plasma Glucose: 146 mg/dL

## 2016-11-21 LAB — GLUCOSE, CAPILLARY: Glucose-Capillary: 120 mg/dL — ABNORMAL HIGH (ref 65–99)

## 2016-11-21 NOTE — Discharge Summary (Signed)
[facility] Discharge Summary   Patient: Ricardo [name] FOY:[id number]   PCP: Thressa Sheller, MD DOB: 1936/07/01   Date of admission: 11/18/2016   Date of discharge: 11/19/2016     Discharge Diagnoses:  Active Problems:   Atrial fibrillation (HCC)   Kidney stones   BPH (benign prostatic hyperplasia)   Chronic anticoagulation   Edema   Acute lower GI bleeding   Chronic systolic congestive heart failure ([place])   Admitted From: home Disposition:  home  Recommendations for Outpatient Follow-up:  1. Follow up with PCP in 1 week, start coumadin on Monday. INR check up on Thursday. Also get CBC check up on Thursday.  2. Follow up with Dr man in 2 weeks.   Follow-up Information    MACKENZIE,BRIAN, MD. Schedule an appointment as soon as possible for a visit in 1 week(s).   Specialty:  Internal Medicine Why:  need a CBC on thursday. Contact information: [place], [place] [address] [phone]        MANN,JYOTHI, MD. Schedule an appointment as soon as possible for a visit in 2 week(s).   Specialty:  Gastroenterology Why:  GI bleed.  Contact information: [address], [place] [address] [phone]          Diet recommendation: cardaic diet  Activity: The patient is advised to gradually reintroduce usual activities.  Discharge Condition: good  Code Status: full code  History of present illness: As per the H and P dictated on admission, "Ricardo [name] is a 81 y.o. male with medical history significant of Afib, BPH, gout, kidney stones, DM2, CHF with last EF reported at 40% on TTE, hemorrhoids who presents for rectal bleeding. Ricardo [name] has had a few procedures recently.  He was seen by Dr. Ola Spurr on 3/21 for Afib and swelling at [facility] who recommended he have a TTE. He was admitted for an overnight stay on 3/24 for acute CHF and he had the TTE on 3/25 which showed EF of 40%.  He was discharged that same day.  He was seen by  Dr. Haroldine Laws in [place] clinic on 3/28 who recommended cardiac MRI, R/L heart catheterization for restrictive pathology seen on TTE.  R/L HC were done on 4/3 this week, showing heavily calcified coronary arteries, EF of 45%, mild pulmonary HTN.  Plan was to proceed with medical management and proceed with cardiac MRI which has not occurred yet.  Over the course of his procedures, he was discontinued from his chronic coumadin in anticipation of the procedures.  He takes this medication for Afib.  He reports no history of VTE.  After his heart catheterization he was started on Eliquis due to more liberalized diet and not having to get INR checks.  He took this medication starting Wednesday of this week.  He was not clear that he was supposed to take it twice a day, so he only took it once a day for 3 days.  He also had constipation this week and took [place] yesterday evening.  This afternoon he developed a gassy/bloating feeling and had 3 bloody bowel movements with bright red blood and clots.  He denies chest pain, dizziness, abdominal pain, fever, chills, SOB.  He has had a colonoscopy in 2013 which showed moderate diverticulosis throughout the bowel and no adenomas.  He has had one diverticular bleed in the past, the same year as the colonoscopy. "  Hospital Course:  Summary of his active problems in the hospital is  as following. Lower GI bleed - DDx includes diverticular bleeding, hemorrhoidal bleeding vs. Other lower GI source - no further significant bleeding in hospital and Hb has remained stable.  - Last colonoscopy in 2013 without any polyps - BP stable, will give low dose of IVF overnight given CHF - Hold anticoagulation for 2 more days and resume back on Monday per gastroenterology, also per pharmacy no need for bridging.     Atrial fibrillation on Chronic anticoagulation - On Eliquis for 3 days only, holding for now, was not taking full dose eliquis. - patient would prefer to go back on  coumadin in future once bleeding stops - Continue digoxin, metoprolol    Chronic systolic HF with edema - Following with Dr. Haroldine Laws and work up is ongoing, he is due to get a cardiac MRI in the near future Resume home meds.  Swallowing issue, dry mouth No further issues in hospital.  CKD stage 2-3 - Baseline seems to be about 1.2 - 1.3 which is where he is currently  DM2 Resume home meds  Leukocytosis - Appears chronic based on chart review, no signs/symptoms of infection, no fever    All other chronic medical condition were stable during the hospitalization.  Patient was ambulatory without any assistance. On the day of the discharge the patient's vitals were stable, and no other acute medical condition were reported by patient. the patient was felt safe to be discharge at home with family.  Procedures and Results:  none   Consultations:  Phone consultation with gastroenterology   DISCHARGE MEDICATION: Discharge Medication List as of 11/19/2016  2:25 PM    START taking these medications   Details  hydrocortisone (ANUSOL-HC) 25 MG suppository Place 1 suppository (25 mg total) rectally 2 (two) times daily., Starting Sat 11/19/2016, Normal    warfarin (COUMADIN) 3 MG tablet Take 1 tablet (3 mg total) by mouth daily., Starting Sat 11/19/2016, Normal      CONTINUE these medications which have NOT CHANGED   Details  calcium carbonate (TUMS - DOSED IN MG ELEMENTAL CALCIUM) 500 MG chewable tablet Chew 2 tablets by mouth daily as needed for indigestion or heartburn., Historical Med    Cholecalciferol (VITAMIN D3) 2000 units capsule Take 2,000 Units by mouth daily., Historical Med    CINNAMON PO Take 1,000 mg by mouth daily., Historical Med    Coenzyme Q10 (COQ-10) 200 MG CAPS Take 200 mg by mouth daily., Historical Med    digoxin (LANOXIN) 0.125 MG tablet Take 0.125 mg by mouth daily., Historical Med    empagliflozin (JARDIANCE) 10 MG TABS tablet Take 10 mg by mouth  daily., Historical Med    furosemide (LASIX) 40 MG tablet Take 0.5 tablets (20 mg total) by mouth daily. Can take extra half tab as needed, Starting Wed 11/09/2016, No Print    losartan (COZAAR) 25 MG tablet Take 25 mg by mouth daily., Historical Med    metoprolol succinate (TOPROL-XL) 25 MG 24 hr tablet Take 1 tablet (25 mg total) by mouth daily., Starting Sun 11/06/2016, Normal    potassium chloride 20 MEQ TBCR Take 20 mEq by mouth daily., Starting Fri 11/18/2016, Normal    spironolactone (ALDACTONE) 25 MG tablet Take 1 tablet (25 mg total) by mouth daily., Starting Wed 11/09/2016, Until Tue 02/07/2017, Normal    SUPER B COMPLEX/C PO Take 1 tablet by mouth daily., Historical Med    triamcinolone (NASACORT ALLERGY 24HR) 55 MCG/ACT AERO nasal inhaler Place 2 sprays into the nose 2 (two) times  daily., Historical Med    vitamin B-12 (CYANOCOBALAMIN) 1000 MCG tablet Take 1,000 mcg by mouth daily., Historical Med      STOP taking these medications     apixaban (ELIQUIS) 2.5 MG TABS tablet      vitamin C (ASCORBIC ACID) 500 MG tablet        Allergies  Allergen Reactions  . Codeine Other (See Comments)    "Gives me headaches"   Discharge Instructions    Diet - low sodium heart healthy    Complete by:  As directed    Discharge instructions    Complete by:  As directed    It is important that you read following instructions as well as go over your medication list with RN to help you understand your care after this hospitalization.  Discharge Instructions: Please follow-up with PCP in one week  Please request your primary care physician to go over all Hospital Tests and Procedure/Radiological results at the follow up,  Please get all Hospital records sent to your PCP by signing hospital release before you go home.   Do not take more than prescribed Pain, Sleep and Anxiety Medications. You were cared for by a hospitalist during your hospital stay. If you have any questions about your  discharge medications or the care you received while you were in the hospital after you are discharged, you can call the unit and ask to speak with the hospitalist on call if the hospitalist that took care of you is not available.  Once you are discharged, your primary care physician will handle any further medical issues. Please note that NO REFILLS for any discharge medications will be authorized once you are discharged, as it is imperative that you return to your primary care physician (or establish a relationship with a primary care physician if you do not have one) for your aftercare needs so that they can reassess your need for medications and monitor your lab values. You Must read complete instructions/literature along with all the possible adverse reactions/side effects for all the Medicines you take and that have been prescribed to you. Take any new Medicines after you have completely understood and accept all the possible adverse reactions/side effects. Wear Seat belts while driving. If you have smoked or chewed Tobacco in the last 2 yrs please stop smoking and/or stop any Recreational drug use.   Increase activity slowly    Complete by:  As directed      Discharge Exam: Filed Weights   11/18/16 2039  Weight: 67.1 kg (148 lb)   Vitals:   11/19/16 0525 11/19/16 0931  BP: 125/78 129/82  Pulse: 89 89  Resp: 16   Temp: 97.9 F (36.6 C)    General: Appear in no distress, no Rash; Oral Mucosa moist. Cardiovascular: S1 and S2 Present, no Murmur, no JVD Respiratory: Bilateral Air entry present and Clear to Auscultation, no Crackles, no wheezes Abdomen: Bowel Sound present, Soft and no tenderness Extremities: no Pedal edema, no calf tenderness Neurology: Grossly no focal neuro deficit.  The results of significant diagnostics from this hospitalization (including imaging, microbiology, ancillary and laboratory) are listed below for reference.    Significant Diagnostic Studies: Dg  Chest 2 View  Result Date: 11/05/2016 CLINICAL DATA:  Initial evaluation for acute shortness of breath. EXAM: CHEST  2 VIEW COMPARISON:  Prior radiograph from 08/06/2015. FINDINGS: Moderate cardiomegaly, stable. Mediastinal silhouette within normal limits. Aortic atherosclerosis. The Lungs normally inflated. Diffuse pulmonary vascular congestion with interstitial prominence, compatible pulmonary  interstitial edema. No focal infiltrates. No pleural effusion. No pneumothorax. No acute osseus abnormality. Chronic compression deformity at the thoracolumbar junction noted, stable. IMPRESSION: 1. Stable cardiomegaly with mild diffuse pulmonary interstitial edema. 2. Aortic atherosclerosis. Electronically Signed   By: Jeannine Boga M.D.   On: 11/05/2016 03:09    Microbiology: No results found for this or any previous visit (from the past 240 hour(s)).   Labs: CBC:  Recent Labs Lab 11/18/16 2046 11/19/16 0126 11/19/16 0255 11/19/16 0828  WBC 14.6*  --  13.1* 13.0*  NEUTROABS 12.0* 10.8*  --   --   HGB 13.6  --  11.7* 11.7*  HCT 40.8  --  35.8* 35.4*  MCV 86.8  --  86.1 86.6  PLT 247  --  223 151   Basic Metabolic Panel:  Recent Labs Lab 11/15/16 0745 11/18/16 2046 11/19/16 0828  NA 134* 137 137  K 3.3* 3.8 3.7  CL 94* 97* 101  CO2 28 29 29   GLUCOSE 126* 143* 110*  BUN 24* 25* 22*  CREATININE 1.43* 1.25* 1.13  CALCIUM 9.1 9.0 8.5*   Liver Function Tests:  Recent Labs Lab 11/18/16 2046  AST 20  ALT 12*  ALKPHOS 52  BILITOT 0.8  PROT 6.5  ALBUMIN 3.2*   No results for input(s): LIPASE, AMYLASE in the last 168 hours. No results for input(s): AMMONIA in the last 168 hours. Cardiac Enzymes: No results for input(s): CKTOTAL, CKMB, CKMBINDEX, TROPONINI in the last 168 hours. BNP (last 3 results)  Recent Labs  11/05/16 0200 11/09/16 1148  BNP 957.3* 995.3*   CBG:  Recent Labs Lab 11/19/16 0406 11/19/16 0757 11/19/16 1153  GLUCAP 131* 115* 119*   Time  spent: 35 minutes  Signed:  Ajamu Maxon  [facility] 11/19/2016   , 10:10 AM

## 2016-11-24 ENCOUNTER — Ambulatory Visit (HOSPITAL_COMMUNITY)
Admission: RE | Admit: 2016-11-24 | Discharge: 2016-11-24 | Disposition: A | Discharge status: Home / Self Care | Payer: BLUE CROSS/BLUE SHIELD | Source: Ambulatory Visit | Attending: Cardiology | Admitting: Cardiology

## 2016-11-24 DIAGNOSIS — I482 Chronic atrial fibrillation, unspecified: Secondary | ICD-10-CM

## 2016-11-24 LAB — BASIC METABOLIC PANEL
ANION GAP: 8 (ref 5–15)
BUN: 20 mg/dL (ref 6–20)
CALCIUM: 8.6 mg/dL — AB (ref 8.9–10.3)
CO2: 28 mmol/L (ref 22–32)
Chloride: 101 mmol/L (ref 101–111)
Creatinine, Ser: 1.39 mg/dL — ABNORMAL HIGH (ref 0.61–1.24)
GFR calc Af Amer: 54 mL/min — ABNORMAL LOW (ref >60)
GFR, EST NON AFRICAN AMERICAN: 47 mL/min — AB (ref >60)
GLUCOSE: 142 mg/dL — AB (ref 65–99)
Potassium: 3.8 mmol/L (ref 3.5–5.1)
Sodium: 137 mmol/L (ref 135–145)

## 2016-11-29 ENCOUNTER — Telehealth (HOSPITAL_COMMUNITY): Payer: Self-pay | Admitting: *Deleted

## 2016-11-29 MED ORDER — APIXABAN 2.5 MG PO TABS: 2.5000 mg | ORAL_TABLET | Freq: Two times a day (BID) | ORAL | 3 refills | Status: DC

## 2016-11-29 NOTE — Telephone Encounter (Signed)
Dr Haroldine Laws and Dr Collene Mares have been speaking via phone about pt's GI bleed and need to start back on anticoags.  Dr Haroldine Laws has advised pt restart Eliquis instead of starting Coumadin.  Pt's wife called to let us know pt will be restarting his Eliquis 2.5 mg BID this afternoon.  He is scheduled to see Dr Collene Mares for lab work and to make sure he is not having any more blood in his stool and then will restart Eliquis, med list updated.

## 2016-11-30 ENCOUNTER — Other Ambulatory Visit: Payer: Self-pay | Admitting: Gastroenterology

## 2016-12-01 ENCOUNTER — Other Ambulatory Visit (HOSPITAL_COMMUNITY): Payer: Self-pay | Admitting: *Deleted

## 2016-12-01 MED ORDER — FUROSEMIDE 20 MG PO TABS: 20.0000 mg | ORAL_TABLET | Freq: Every day | ORAL | 6 refills | Status: DC

## 2016-12-01 MED ORDER — METOPROLOL SUCCINATE ER 25 MG PO TB24: 25.0000 mg | ORAL_TABLET | Freq: Every day | ORAL | 6 refills | Status: DC

## 2016-12-02 ENCOUNTER — Encounter (HOSPITAL_COMMUNITY): Payer: Self-pay | Admitting: *Deleted

## 2016-12-02 ENCOUNTER — Telehealth (HOSPITAL_COMMUNITY): Payer: Self-pay

## 2016-12-02 NOTE — Telephone Encounter (Signed)
Pts wife called back stating she did not receive a return call from [place] (see previous note from [place]). I spoke with Dr.Bensimhon he advised pt to take lasix 40mg  daily for 3 days.  Call Monday to update [place]. Pt scheduled for appt 4/25

## 2016-12-02 NOTE — Progress Notes (Signed)
SDW-Pre-op call completed by both pt and pt spouse, Ricardo [name], with verbal consent from pt. Pt denies SOB and chest pain but is under the care of Dr. Haroldine Laws, Cardiology. Spouse stated that pt continues to have lower extremity edema despite taking Lasix; spouse advised to follow up with cardiologist immediately.  Spouse stated that pt is not currently taking Eliquis, " it was stopped that Friday after the cath."  Spouse also stated that pt does not take Aspirin. Spouse made aware to have pt stop taking otc vitamins, CO Q 10, Cinnamon, fish oil and herbal medications . Do not take any NSAIDs ie: Ibuprofen, Advil, Naproxen, BC and Goody Powder. Spouse made aware to have pt check BG every 2 hours prior to arrival to hospital on day of surgery, do not take Jardiance DOS, if BG is < 70, drink 4 ounces of apple juice, wait 15 minutes after drinking juice to recheck BG, if BG remains < 70 call the Endo unit. Spouse verbalized understanding of all pre-op instructions.

## 2016-12-02 NOTE — Telephone Encounter (Signed)
Wife calling to report patient with increased BLEE up to shins. States patient's weight is down to 141 lbs, took 40 mg lasix this morning (normal daily dose 20 mg once daily, took extra prn as prescribed today) 4 hours ago with no increase in UOP. Patient denies SOB, CP, cough, or any other s/s. Advised to keep patient's feet elevated, limit salt and fluid intake, and will return call once Dr. Haroldine Laws is able to advise.  Renee Pain, RN

## 2016-12-05 ENCOUNTER — Telehealth (HOSPITAL_COMMUNITY): Payer: Self-pay | Admitting: *Deleted

## 2016-12-05 ENCOUNTER — Ambulatory Visit (HOSPITAL_COMMUNITY)
Admission: RE | Admit: 2016-12-05 | Payer: BLUE CROSS/BLUE SHIELD | Source: Ambulatory Visit | Admitting: Gastroenterology

## 2016-12-05 HISTORY — DX: Urinary tract infection, site not specified: N39.0

## 2016-12-05 HISTORY — DX: Essential (primary) hypertension: I10

## 2016-12-05 HISTORY — DX: Nausea with vomiting, unspecified: Z98.890

## 2016-12-05 HISTORY — DX: Gastro-esophageal reflux disease without esophagitis: K21.9

## 2016-12-05 HISTORY — DX: Pneumonia, unspecified organism: J18.9

## 2016-12-05 HISTORY — DX: Type 2 diabetes mellitus without complications: E11.9

## 2016-12-05 HISTORY — DX: Gastrointestinal hemorrhage, unspecified: K92.2

## 2016-12-05 HISTORY — DX: Heart failure, unspecified: I50.9

## 2016-12-05 HISTORY — DX: Nausea with vomiting, unspecified: R11.2

## 2016-12-05 HISTORY — DX: Adverse effect of unspecified anesthetic, initial encounter: T41.45XA

## 2016-12-05 HISTORY — DX: Other complications of anesthesia, initial encounter: T88.59XA

## 2016-12-05 SURGERY — ESOPHAGOGASTRODUODENOSCOPY (EGD) WITH PROPOFOL
Anesthesia: Monitor Anesthesia Care

## 2016-12-05 NOTE — Telephone Encounter (Signed)
Patient's wife called triage line and left a message stating that patient's swelling had decreased but still have some present in his right leg and ankle.   I tried calling her back but had to leave a message asking for her to return my call.

## 2016-12-07 ENCOUNTER — Ambulatory Visit (HOSPITAL_COMMUNITY)
Admission: RE | Admit: 2016-12-07 | Discharge: 2016-12-07 | Disposition: A | Discharge status: Home / Self Care | Payer: BLUE CROSS/BLUE SHIELD | Source: Ambulatory Visit | Attending: Internal Medicine | Admitting: Internal Medicine

## 2016-12-07 ENCOUNTER — Encounter (HOSPITAL_COMMUNITY): Payer: Self-pay | Admitting: Internal Medicine

## 2016-12-07 VITALS — BP 142/82 | HR 79 | Wt 148.2 lb

## 2016-12-07 DIAGNOSIS — K922 Gastrointestinal hemorrhage, unspecified: Secondary | ICD-10-CM

## 2016-12-07 DIAGNOSIS — Z7901 Long term (current) use of anticoagulants: Secondary | ICD-10-CM | POA: Insufficient documentation

## 2016-12-07 DIAGNOSIS — I2510 Atherosclerotic heart disease of native coronary artery without angina pectoris: Secondary | ICD-10-CM | POA: Diagnosis not present

## 2016-12-07 DIAGNOSIS — Z9889 Other specified postprocedural states: Secondary | ICD-10-CM | POA: Diagnosis not present

## 2016-12-07 DIAGNOSIS — I482 Chronic atrial fibrillation, unspecified: Secondary | ICD-10-CM

## 2016-12-07 DIAGNOSIS — N400 Enlarged prostate without lower urinary tract symptoms: Secondary | ICD-10-CM | POA: Diagnosis not present

## 2016-12-07 DIAGNOSIS — Z87442 Personal history of urinary calculi: Secondary | ICD-10-CM | POA: Diagnosis not present

## 2016-12-07 DIAGNOSIS — I5022 Chronic systolic (congestive) heart failure: Secondary | ICD-10-CM | POA: Diagnosis not present

## 2016-12-07 DIAGNOSIS — K219 Gastro-esophageal reflux disease without esophagitis: Secondary | ICD-10-CM | POA: Diagnosis not present

## 2016-12-07 DIAGNOSIS — E119 Type 2 diabetes mellitus without complications: Secondary | ICD-10-CM | POA: Insufficient documentation

## 2016-12-07 DIAGNOSIS — I350 Nonrheumatic aortic (valve) stenosis: Secondary | ICD-10-CM | POA: Diagnosis not present

## 2016-12-07 DIAGNOSIS — K5792 Diverticulitis of intestine, part unspecified, without perforation or abscess without bleeding: Secondary | ICD-10-CM | POA: Diagnosis not present

## 2016-12-07 DIAGNOSIS — G4733 Obstructive sleep apnea (adult) (pediatric): Secondary | ICD-10-CM | POA: Diagnosis not present

## 2016-12-07 DIAGNOSIS — Z87440 Personal history of urinary (tract) infections: Secondary | ICD-10-CM | POA: Diagnosis not present

## 2016-12-07 DIAGNOSIS — M109 Gout, unspecified: Secondary | ICD-10-CM | POA: Insufficient documentation

## 2016-12-07 DIAGNOSIS — I110 Hypertensive heart disease with heart failure: Secondary | ICD-10-CM | POA: Diagnosis present

## 2016-12-07 DIAGNOSIS — E876 Hypokalemia: Secondary | ICD-10-CM | POA: Diagnosis not present

## 2016-12-07 DIAGNOSIS — Q211 Atrial septal defect: Secondary | ICD-10-CM | POA: Diagnosis not present

## 2016-12-07 LAB — BASIC METABOLIC PANEL
ANION GAP: 8 (ref 5–15)
BUN: 25 mg/dL — ABNORMAL HIGH (ref 6–20)
CALCIUM: 8.9 mg/dL (ref 8.9–10.3)
CO2: 29 mmol/L (ref 22–32)
CREATININE: 1.37 mg/dL — AB (ref 0.61–1.24)
Chloride: 101 mmol/L (ref 101–111)
GFR calc Af Amer: 55 mL/min — ABNORMAL LOW (ref >60)
GFR calc non Af Amer: 47 mL/min — ABNORMAL LOW (ref >60)
GLUCOSE: 107 mg/dL — AB (ref 65–99)
Potassium: 3.6 mmol/L (ref 3.5–5.1)
Sodium: 138 mmol/L (ref 135–145)

## 2016-12-07 LAB — CBC
HCT: 35.6 % — ABNORMAL LOW (ref 39.0–52.0)
Hemoglobin: 11.6 g/dL — ABNORMAL LOW (ref 13.0–17.0)
MCH: 28.5 pg (ref 26.0–34.0)
MCHC: 32.6 g/dL (ref 30.0–36.0)
MCV: 87.5 fL (ref 78.0–100.0)
PLATELETS: 175 10*3/uL (ref 150–400)
RBC: 4.07 MIL/uL — AB (ref 4.22–5.81)
RDW: 16.3 % — AB (ref 11.5–15.5)
WBC: 8.1 10*3/uL (ref 4.0–10.5)

## 2016-12-07 LAB — BRAIN NATRIURETIC PEPTIDE: B Natriuretic Peptide: 1365.5 pg/mL — ABNORMAL HIGH (ref 0.0–100.0)

## 2016-12-07 MED ORDER — FUROSEMIDE 20 MG PO TABS: 20.0000 mg | ORAL_TABLET | ORAL | 6 refills | Status: DC

## 2016-12-07 MED ORDER — FUROSEMIDE 40 MG PO TABS: 40.0000 mg | ORAL_TABLET | ORAL | 3 refills | Status: DC

## 2016-12-07 NOTE — Progress Notes (Signed)
ADVANCED HF CLINIC CONSULT NOTE  Referring Physician: Bonner Puna Primary Care: [facility] Primary Cardiologist: Ola Spurr  HPI:   Ricardo [name] is a 81 y.o. male with h/o HTN, diabetes chronic atrial fibrillation and a patent foramen ovale  Has had AF for years. Followed by Dr. Ola Spurr at [facility]. In reviewing records in [place] has also had an AFL ablation. Echo in . 12/13 EF 40-45%. Does not recall having a cath in past. Denies h/o of HF. Had sleep study in past and placed on CPAP. He was subsequently told OSA was mild and CPAP stopped.   In January began to develop HF symptoms with SOB, fatigue, LE edema, orthopnea and PND. Says in December was riding stationary bike 3 miles per day without problem. Had URI at end of December and got worse. Saw Dr. Ola Spurr in January. Started lasix 20mg  PRN and ordered echo and Holter monitor due to elevated AF rate.   Admitted to [facility] in 3/18 with worseningg HF. BNP 957. AF rate elevated. Echo 40-45% with moderate RV dysfunction. Mild AS. Diuresed 9 pounds and placed on metoprolol and digoxin for HR control. Weight on d/c 145. Sent home on lasix 40 daily.   We saw him at the end of March for the first time. Volume status looked ok. Cut lasix back to baseline dose and started spiro. Recommended R/L cath as below. Treated medically. We switched coumadin to Eliquis and developed rectal bleeding in setting of laxative use and straining with BMs. Says it was BRBPR. Did not fill the whole toilet. Hgb dropped from 13.6 to 11.7. Felt it might have been hemorrhoidal bleed.  Saw Dr. Collene Mares and is pending colonoscopy once edema resolved. Now back on Eliquis without problem. Weight stable at 143 at home (was 147 prior to HF hospitalization). Back to work. But not sure he is back to baseline. Feet still swelling. Increased lasix to 40 daily for 4 days. No orthopnea or PND. No CP  Cath 11/15/16  Very heavily calcified coronary arteries with high grade lesion in  moderate-sized OM1 and tandem 70% and 80% lesions in mid LAD. Distal PDA 90%. Treated medically.   RA =  6 RV = 48/6 PA = 48/19 (29) PCW = 17 Ao = 135/89 (108) LV = 147/14 Fick cardiac output/index = 3.8/2.2 PVR = 3.2 SVR = 2131 FA sat = 98% PA sat = 67%. 69%    Echo 12/13 EF 40-45%    Past Medical History:  Diagnosis Date  . Atrial fibrillation (HCC)    on coumadin.  cardiologist is in [facility] dr [name].  S/P multiple DCCVs and prior ablation.   Marland Kitchen BPH (benign prostatic hyperplasia)    by ST in 2000.   Marland Kitchen CHF (congestive heart failure) ([place])   . Complication of anesthesia    " sometimes slow to wake up"  . Diabetes mellitus without complication ([place])   . Diverticulitis    question if he ever had imaging confirmed diverticulitis  . GERD (gastroesophageal reflux disease)   . GI bleed   . Gout   . Hypertension   . Kidney stones    treated with lithotripsy   . Pneumonia   . PONV (postoperative nausea and vomiting)   . Rupture of biceps tendon 2006   left distal bicep tendon  . UTI (urinary tract infection)     Current Outpatient Prescriptions  Medication Sig Dispense Refill  . apixaban (ELIQUIS) 2.5 MG TABS tablet Take 1 tablet (2.5 mg total) by  mouth 2 (two) times daily. 60 tablet 3  . dexlansoprazole (DEXILANT) 60 MG capsule Take 60 mg by mouth daily.    . digoxin (LANOXIN) 0.125 MG tablet Take 0.125 mg by mouth daily.    . empagliflozin (JARDIANCE) 10 MG TABS tablet Take 10 mg by mouth daily.    . furosemide (LASIX) 20 MG tablet Take 1 tablet (20 mg total) by mouth daily. Can take extra tab as needed 45 tablet 6  . losartan (COZAAR) 25 MG tablet Take 25 mg by mouth daily.    . metoprolol succinate (TOPROL-XL) 25 MG 24 hr tablet Take 1 tablet (25 mg total) by mouth daily. 30 tablet 6  . potassium chloride 20 MEQ TBCR Take 20 mEq by mouth daily. 30 tablet 3  . spironolactone (ALDACTONE) 25 MG tablet Take 1 tablet (25 mg total) by mouth daily. 30  tablet 3  . hydrocortisone (ANUSOL-HC) 25 MG suppository Place 1 suppository (25 mg total) rectally 2 (two) times daily. 12 suppository 0   No current facility-administered medications for this encounter.     Allergies  Allergen Reactions  . Codeine Other (See Comments)    "Gives me headaches"  . Tape     Adhesive tape caused bruising; use paper tape only.      Social History   Social History  . Marital status: Married    Spouse name: N/A  . Number of children: N/A  . Years of education: N/A   Occupational History  . Not on file.   Social History Main Topics  . Smoking status: Never Smoker  . Smokeless tobacco: Never Used  . Alcohol use Yes     Comment: rare glass of wine  . Drug use: No  . Sexual activity: Not on file   Other Topics Concern  . Not on file   Social History Narrative  . No narrative on file      Family History  Problem Relation Age of Onset  . Tuberculosis Mother   . Stroke Father   . Congestive Heart Failure Father     Vitals:   12/07/16 1042  BP: (!) 142/82  Pulse: 79  SpO2: 100%  Weight: 148 lb 4 oz (67.2 kg)    PHYSICAL EXAM: General: Thin. Well appearing. No respiratory difficulty HEENT: normal Neck: supple. no JVD. Carotids 2+ bilat; no bruits. No lymphadenopathy or thryomegaly appreciated. Cor: PMI nondisplaced. IRR. IRR. 2/6 SEM RUSB Lungs: clear Abdomen: soft, nontender, nondistended. No hepatosplenomegaly. No bruits or masses. Good bowel sounds. Extremities: no cyanosis, clubbing, rash, 1+ edema Neuro: alert & oriented x 3, cranial nerves grossly intact. moves all 4 extremities w/o difficulty. Affect pleasant.   ASSESSMENT & PLAN: 1. Chronic systolic HF --Echo reviewed personally. EF 40-45% with moderate RV dysfunction. 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 Etiology unclear. ? HTN, OSA or infiltrative process --Volume status looked good on RHC earlier this month but now appears back up and has been taking extra lasix.  - Will  increase lasix 40 qod alternating with 20 qod. Can take extra as needed.  - Continue spiro 25mg  daily --BMET today  --Will need cMRI to further evaluate for infiltrative CM. - SPEP negative 2. Chronic AF  --Rate controlled --CHADSVASC = 5 (age, DM, HTN, HF) --Continue Eliquis  3. Mild aortic stenosis 4. HTN  --BP mildly elevated. Consider Entresto at next visit  5. DM2 6. Hypokalemia --Recheck today 7. Probable OSA --Will schedule sleep study 8. LGIB -Likely hemorrhoidal. Colonscopy 5 years ago ok.  Follows with Dr. Collene Mares,. I do not see clear reason to repeat colonoscopy unless he rebleeds.  9. CAD -He has highly calcified coronary arteries with borderline lesion in midLAD. Currently no ischemic sx. I reviewed with Dr. Burt Knack who agrees with plan for medical therapy.   Total time spent 45 minutes. Over half that time spent discussing above.    Glori Bickers, MD  11:01 AM

## 2016-12-07 NOTE — Patient Instructions (Signed)
Change Furosemide (lasix) to 20 mg every other day ALTERNATING with 40 mg every other day   Your physician recommends that you schedule a follow-up appointment in: 2 months

## 2016-12-08 ENCOUNTER — Telehealth (HOSPITAL_COMMUNITY): Payer: Self-pay | Admitting: *Deleted

## 2016-12-08 ENCOUNTER — Telehealth (HOSPITAL_COMMUNITY): Payer: Self-pay

## 2016-12-08 NOTE — Telephone Encounter (Signed)
Patient's wife called CHF clinic  To report patient has had off and on nose bleed during the night. Says this has gone on "for years" but patient was told by Dr. Dyanne Carrel not to use Afrin. Patient to see Dr. Dyanne Carrel today at 1030 for the nose bleed. Patient's wife will either have MD fax note from todays visit or wife will call us to update.  Renee Pain, RN

## 2016-12-08 NOTE — Telephone Encounter (Signed)
Patient's wife called letting us know that patient went to see Dr. Dyanne Carrel today and Dr. Dyanne Carrel was able to cauterize patient's nose to stop his nose bleeds.  Dr. Dyanne Carrel advised them to contact Dr. Haroldine Laws about when he needs to restart his Eliquis.   I spoke with Darrick Grinder, NP and she advises him to restart Eliquis 24 hours after cauterization.  Patient's wife is aware and understands with no further questions.

## 2016-12-16 ENCOUNTER — Other Ambulatory Visit: Payer: Self-pay | Admitting: Nephrology

## 2016-12-16 DIAGNOSIS — N183 Chronic kidney disease, stage 3 unspecified: Secondary | ICD-10-CM

## 2016-12-27 ENCOUNTER — Ambulatory Visit
Admission: RE | Admit: 2016-12-27 | Discharge: 2016-12-27 | Disposition: A | Discharge status: Home / Self Care | Payer: BLUE CROSS/BLUE SHIELD | Source: Ambulatory Visit | Attending: Nephrology | Admitting: Nephrology

## 2016-12-27 DIAGNOSIS — N183 Chronic kidney disease, stage 3 unspecified: Secondary | ICD-10-CM

## 2017-01-13 ENCOUNTER — Telehealth (HOSPITAL_COMMUNITY): Payer: Self-pay | Admitting: *Deleted

## 2017-01-13 NOTE — Telephone Encounter (Signed)
Patient's wife called and said that patient decided to stop eliquis and restart coumadin due to the frequent nose bleeds.  Patient restarted coumadin about 3 weeks ago.    I explained to her that they needed to contact whoever was managing his coumadin prior.  She stated she would contact them today. No further questions at this time.

## 2017-01-16 ENCOUNTER — Telehealth (HOSPITAL_COMMUNITY): Payer: Self-pay | Admitting: *Deleted

## 2017-01-16 NOTE — Telephone Encounter (Signed)
Patient called triage line again today to tell us he had decided to stop coumadin again and restart Eliquis.  Patient educated on the medication compliance and that he needed to discuss with [place] before stopping and restarting mediations.  Patient verbalized he understands and will take as prescribed.  Dr. Haroldine Laws said it would be ok for him to restart his eliquis since he has been off coumadin for 3 days now.  Patient has follow up appointment in clinic June 26th.  No further questions as this time.

## 2017-01-31 ENCOUNTER — Other Ambulatory Visit (HOSPITAL_COMMUNITY): Payer: Self-pay | Admitting: *Deleted

## 2017-01-31 MED ORDER — DIGOXIN 125 MCG PO TABS: 0.1250 mg | ORAL_TABLET | Freq: Every day | ORAL | 3 refills | Status: DC

## 2017-02-07 ENCOUNTER — Ambulatory Visit (HOSPITAL_COMMUNITY)
Admission: RE | Admit: 2017-02-07 | Discharge: 2017-02-07 | Disposition: A | Discharge status: Home / Self Care | Payer: BLUE CROSS/BLUE SHIELD | Source: Ambulatory Visit | Attending: Internal Medicine | Admitting: Internal Medicine

## 2017-02-07 VITALS — BP 140/84 | HR 90 | Wt 143.8 lb

## 2017-02-07 DIAGNOSIS — I480 Paroxysmal atrial fibrillation: Secondary | ICD-10-CM

## 2017-02-07 DIAGNOSIS — Q211 Atrial septal defect: Secondary | ICD-10-CM | POA: Insufficient documentation

## 2017-02-07 DIAGNOSIS — I110 Hypertensive heart disease with heart failure: Secondary | ICD-10-CM | POA: Insufficient documentation

## 2017-02-07 DIAGNOSIS — Z7901 Long term (current) use of anticoagulants: Secondary | ICD-10-CM | POA: Diagnosis not present

## 2017-02-07 DIAGNOSIS — I2510 Atherosclerotic heart disease of native coronary artery without angina pectoris: Secondary | ICD-10-CM | POA: Insufficient documentation

## 2017-02-07 DIAGNOSIS — I4821 Permanent atrial fibrillation: Secondary | ICD-10-CM

## 2017-02-07 DIAGNOSIS — Z8719 Personal history of other diseases of the digestive system: Secondary | ICD-10-CM | POA: Diagnosis not present

## 2017-02-07 DIAGNOSIS — R29818 Other symptoms and signs involving the nervous system: Secondary | ICD-10-CM

## 2017-02-07 DIAGNOSIS — I350 Nonrheumatic aortic (valve) stenosis: Secondary | ICD-10-CM | POA: Diagnosis not present

## 2017-02-07 DIAGNOSIS — K219 Gastro-esophageal reflux disease without esophagitis: Secondary | ICD-10-CM | POA: Diagnosis not present

## 2017-02-07 DIAGNOSIS — Z79899 Other long term (current) drug therapy: Secondary | ICD-10-CM | POA: Insufficient documentation

## 2017-02-07 DIAGNOSIS — I482 Chronic atrial fibrillation: Secondary | ICD-10-CM | POA: Diagnosis not present

## 2017-02-07 DIAGNOSIS — I5022 Chronic systolic (congestive) heart failure: Secondary | ICD-10-CM | POA: Diagnosis present

## 2017-02-07 DIAGNOSIS — I10 Essential (primary) hypertension: Secondary | ICD-10-CM | POA: Diagnosis not present

## 2017-02-07 DIAGNOSIS — E119 Type 2 diabetes mellitus without complications: Secondary | ICD-10-CM | POA: Insufficient documentation

## 2017-02-07 MED ORDER — DIGOXIN 125 MCG PO TABS: 0.1250 mg | ORAL_TABLET | Freq: Every day | ORAL | 6 refills | Status: DC

## 2017-02-07 MED ORDER — SACUBITRIL-VALSARTAN 24-26 MG PO TABS: 1.0000 | ORAL_TABLET | Freq: Two times a day (BID) | ORAL | 3 refills | Status: DC

## 2017-02-07 NOTE — Progress Notes (Signed)
ADVANCED HF CLINIC CONSULT NOTE  Referring Physician: Bonner Puna Primary Care: [facility] Primary Cardiologist: Ola Spurr  HPI:  Ricardo [name] is a 81 y.o. male  with h/o HTN, diabetes chronic atrial fibrillation and a patent foramen ovale  Has had AF for years. Followed by Dr. Ola Spurr at [facility]. In reviewing records in [place] has also had an AFL ablation. Echo in . 12/13 EF 40-45%. Does not recall having a cath in past. Denies h/o of HF. Had sleep study in past and placed on CPAP. He was subsequently told OSA was mild and CPAP stopped.   In January began to develop HF symptoms with SOB, fatigue, LE edema, orthopnea and PND. Says in December was riding stationary bike 3 miles per day without problem. Had URI at end of December and got worse. Saw Dr. Ola Spurr in January. Started lasix 20mg  PRN and ordered echo and Holter monitor due to elevated AF rate.   Admitted to [facility] in 3/18 with worseningg HF. BNP 957. AF rate elevated. Echo 40-45% with moderate RV dysfunction. Mild AS. Diuresed 9 pounds and placed on metoprolol and digoxin for HR control. Weight on d/c 145. Sent home on lasix 40 daily.   He presents today for follow up. At last visit we increased lasix.  Since then has been switched to torsemide by Dr. Posey Pronto with Ren[facility]. Had frequent nosebleeds on Eliquis, but was on chronic Afrin. This is now stopped and has had cauterization with resolution. Still has not had colonoscopy. Holding off for cardiac clearance.  Breathing has been OK. Rides stationary bike for 2 miles (abour 8.5-10 minute miles) without SOB. No further bleeding. Edema much better controlled on torsemide. No orthopnea or PND. No bendopnea. No CP. Denies lightheadedness or dizziness.   Echo 11/06/16 LVEF 40%, Mild/Mod MR, Severe LAE, Severe RAE, + Patent foramen ovale  Cath 11/15/16 Very heavily calcified coronary arteries with high grade lesion in moderate-sized OM1 and tandem 70% and 80% lesions in mid LAD. Distal PDA  90%. Treated medically.   RA =  6 RV = 48/6 PA = 48/19 (29) PCW = 17 Ao = 135/89 (108) LV = 147/14 Fick cardiac output/index = 3.8/2.2 PVR = 3.2 SVR = 2131 FA sat = 98% PA sat = 67%. 69%   Echo 12/13 EF 40-45%   Past Medical History:  Diagnosis Date  . Atrial fibrillation (HCC)    on coumadin.  cardiologist is in [facility] dr [name].  S/P multiple DCCVs and prior ablation.   Marland Kitchen BPH (benign prostatic hyperplasia)    by ST in 2000.   Marland Kitchen CHF (congestive heart failure) ([place])   . Complication of anesthesia    " sometimes slow to wake up"  . Diabetes mellitus without complication ([place])   . Diverticulitis    question if he ever had imaging confirmed diverticulitis  . GERD (gastroesophageal reflux disease)   . GI bleed   . Gout   . Hypertension   . Kidney stones    treated with lithotripsy   . Pneumonia   . PONV (postoperative nausea and vomiting)   . Rupture of biceps tendon 2006   left distal bicep tendon  . UTI (urinary tract infection)     Current Outpatient Prescriptions  Medication Sig Dispense Refill  . apixaban (ELIQUIS) 2.5 MG TABS tablet Take 1 tablet (2.5 mg total) by mouth 2 (two) times daily. 60 tablet 3  . b complex vitamins tablet Take 1 tablet by mouth daily.    Marland Kitchen  Cholecalciferol (VITAMIN D3) 2000 units TABS Take 2,000 Units by mouth daily.    . Cinnamon 500 MG capsule Take 1,000 mg by mouth daily.    . Coenzyme Q10 (CO Q10) 200 MG CAPS Take 200 mcg by mouth daily.    . cyanocobalamin 100 MCG tablet Take 1,000 mcg by mouth daily.    Marland Kitchen dexlansoprazole (DEXILANT) 60 MG capsule Take 60 mg by mouth daily.    . digoxin (LANOXIN) 0.125 MG tablet Take 1 tablet (0.125 mg total) by mouth daily. 30 tablet 3  . docusate sodium (COLACE) 100 MG capsule Take 100 mg by mouth daily as needed for mild constipation.    . empagliflozin (JARDIANCE) 10 MG TABS tablet Take 10 mg by mouth daily.    . hydrocortisone (ANUSOL-HC) 25 MG suppository Place 1 suppository  (25 mg total) rectally 2 (two) times daily. 12 suppository 0  . losartan (COZAAR) 25 MG tablet Take 25 mg by mouth daily.    . Magnesium Oxide 250 MG TABS Take 250 mg by mouth daily.    . metoprolol succinate (TOPROL-XL) 25 MG 24 hr tablet Take 1 tablet (25 mg total) by mouth daily. 30 tablet 6  . potassium chloride 20 MEQ TBCR Take 20 mEq by mouth daily. 30 tablet 3  . ranitidine (ZANTAC) 150 MG capsule Take 150 mg by mouth daily as needed for heartburn.    . spironolactone (ALDACTONE) 25 MG tablet Take 1 tablet (25 mg total) by mouth daily. 30 tablet 3  . torsemide (DEMADEX) 20 MG tablet Take 20 mg by mouth daily.  6  . vitamin C (ASCORBIC ACID) 250 MG tablet Take 250 mg by mouth daily.     No current facility-administered medications for this encounter.    Allergies  Allergen Reactions  . Codeine Other (See Comments)    "Gives me headaches"  . Tape     Adhesive tape caused bruising; use paper tape only.   Social History   Social History  . Marital status: Married    Spouse name: N/A  . Number of children: N/A  . Years of education: N/A   Occupational History  . Not on file.   Social History Main Topics  . Smoking status: Never Smoker  . Smokeless tobacco: Never Used  . Alcohol use Yes     Comment: rare glass of wine  . Drug use: No  . Sexual activity: Not on file   Other Topics Concern  . Not on file   Social History Narrative  . No narrative on file     Family History  Problem Relation Age of Onset  . Tuberculosis Mother   . Stroke Father   . Congestive Heart Failure Father     Vitals:   02/07/17 1500  BP: 140/84  Pulse: 90  SpO2: 96%  Weight: 143 lb 12 oz (65.2 kg)   Wt Readings from Last 3 Encounters:  02/07/17 143 lb 12 oz (65.2 kg)  12/07/16 148 lb 4 oz (67.2 kg)  11/18/16 148 lb (67.1 kg)    PHYSICAL EXAM: General: Elderly. Thin. No resp difficulty. HEENT: Normal Neck: Supple. JVP 6-7 cm. Carotids 2+ bilat; no bruits. No thyromegaly or  nodule noted. Cor: PMI nondisplaced. Irregularly irregular, 2/6 SEM RUSB.  Lungs: CTAB, normal effort. Abdomen: Soft, non-tender, non-distended, no HSM. No bruits or masses. +BS  Extremities: No cyanosis or clubbing. R ankle with discoloration and ulceration with dressing in place. Trace to 1+ edema bilaterally.   Neuro: Alert &  orientedx3, cranial nerves grossly intact. moves all 4 extremities w/o difficulty. Affect pleasant   ASSESSMENT & PLAN: 1. Chronic systolic HF --Echo 3/81/82 EF 40-45% with moderate RV dysfunction. 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 Etiology unclear. ? HTN, OSA or infiltrative process - Volume status stable on exam.  - Continue torsemide 20 mg daily.  - Continue spiro 25mg  daily - Continue digoxin 0.125 mg daily. Check level in 10-14 days with BMET - Awaiting labs from neprhology 02/02/17.  - Stop losartan. Start Entresto 24-26 mg BID. Check BMET 10-14 days.  - Can consider cMRI to further evaluate for infiltrative CM. - SPEP negative - Refer to cardiac rehab.  2. Chronic AF  --Rate controlled --CHADSVASC = 5 (age, DM, HTN, HF) --Continue Eliquis. No further bleeding.   3. Mild aortic stenosis - Moderately calcified on Echo 11/06/16. - Mean gradient 9 mm Hg, Peak gradient 15 mm Hg 4. HTN  - Meds as above.  5. DM2 - Per PCP 6. Probable OSA - We have previously recommended Sleep Study. They would like to consider.  7. H/o LGIB - Follows with Dr. Collene Mares. No clear reason for colonoscopy unless he rebleeds.  8. CAD -He has highly calcified coronary arteries with borderline lesion in midLAD. Currently no ischemic sx. Dr. Haroldine Laws has discussed personally with Dr. Burt Knack who agreed with plan for medical therapy. No change.   Meds as above.   Shirley Friar, PA-C  3:47 PM  Patient seen and examined with the above-signed Advanced Practice Provider and/or Housestaff. I personally reviewed laboratory data, imaging studies and relevant notes. I independently  examined the patient and formulated the important aspects of the plan. I have edited the note to reflect any of my changes or salient points. I have personally discussed the plan with the patient and/or family.  He is doing well. NYHA II-III. Volume status much improved. No s/s angina. We had long discussion about role of medical therapy for CAD. No indication for PCI at this point. Will switch losartan to low dose Entresto 24/26. Watch volume status closely.   Refer to CR for HF and deconditioning.   Tolerating Eliquis well. Will continue.   Glori Bickers, MD  11:35 PM

## 2017-02-07 NOTE — Progress Notes (Signed)
Advanced Heart Failure Medication Review by a Pharmacist  Does the patient  feel that his/her medications are working for him/her?  yes  Has the patient been experiencing any side effects to the medications prescribed?  no  Does the patient measure his/her own blood pressure or blood glucose at home?  no   Does the patient have any problems obtaining medications due to transportation or finances?   no  Understanding of regimen: good Understanding of indications: good Potential of compliance: good Patient understands to avoid NSAIDs. Patient understands to avoid decongestants.  Issues to address at subsequent visits: none   Pharmacist comments: Mr. Marsiglia is a pleasant 81 yo male presenting with wife and medication list. They have an excellent understanding of their medication regimen. No issues with medications at this time. Added docusate, ranitidine, b12, mag ox, b complex, cinnamon, Co Q10, vitamin D3 to med list. Still has Astronomer.   Carlean Jews, Pharm.D. PGY1 Pharmacy Resident 6/26/20183:15 PM Pager [phone]   Time with patient: 10 mins Preparation and documentation time: 3 mins Total time: 13 mins

## 2017-02-07 NOTE — Patient Instructions (Signed)
Stop Losartan  Start Entresto 24/26 mg Twice daily   Labs in 10-14 ddays  You have been referred to Cardiac Rehab, they will call you to schedule  Your physician recommends that you schedule a follow-up appointment in: 2 months

## 2017-02-09 ENCOUNTER — Telehealth (HOSPITAL_COMMUNITY): Payer: Self-pay | Admitting: Pharmacist

## 2017-02-09 NOTE — Telephone Encounter (Signed)
Entresto prior authorization approved through [employer] effective 02/08/17-08/14/38  Carlean Jews, Pharm.D. PGY1 Pharmacy Resident 6/28/20182:39 PM Pager [phone]

## 2017-02-10 ENCOUNTER — Other Ambulatory Visit (HOSPITAL_COMMUNITY): Payer: Self-pay | Admitting: Internal Medicine

## 2017-02-20 ENCOUNTER — Ambulatory Visit (HOSPITAL_COMMUNITY)
Admission: RE | Admit: 2017-02-20 | Discharge: 2017-02-20 | Disposition: A | Discharge status: Home / Self Care | Payer: BLUE CROSS/BLUE SHIELD | Source: Ambulatory Visit | Attending: Internal Medicine | Admitting: Internal Medicine

## 2017-02-20 DIAGNOSIS — I5022 Chronic systolic (congestive) heart failure: Secondary | ICD-10-CM | POA: Diagnosis not present

## 2017-02-20 LAB — BASIC METABOLIC PANEL
ANION GAP: 7 (ref 5–15)
BUN: 26 mg/dL — ABNORMAL HIGH (ref 6–20)
CO2: 33 mmol/L — ABNORMAL HIGH (ref 22–32)
Calcium: 9.3 mg/dL (ref 8.9–10.3)
Chloride: 97 mmol/L — ABNORMAL LOW (ref 101–111)
Creatinine, Ser: 1.36 mg/dL — ABNORMAL HIGH (ref 0.61–1.24)
GFR, EST AFRICAN AMERICAN: 55 mL/min — AB (ref >60)
GFR, EST NON AFRICAN AMERICAN: 48 mL/min — AB (ref >60)
Glucose, Bld: 119 mg/dL — ABNORMAL HIGH (ref 65–99)
POTASSIUM: 3.9 mmol/L (ref 3.5–5.1)
SODIUM: 137 mmol/L (ref 135–145)

## 2017-02-20 LAB — DIGOXIN LEVEL: DIGOXIN LVL: 1 ng/mL (ref 0.8–2.0)

## 2017-02-22 ENCOUNTER — Telehealth (HOSPITAL_COMMUNITY): Payer: Self-pay

## 2017-02-22 NOTE — Telephone Encounter (Signed)
Patient insurance is active and benefits verified. Patient has BCBS - no co-payment, deductible $500/$500 has been met, out of pocket $1500/$1500 has been met, 20% co-insurance, no pre-authorization and no limit on visit. Passport/reference [phone].

## 2017-03-02 ENCOUNTER — Inpatient Hospital Stay (HOSPITAL_COMMUNITY): Admission: RE | Admit: 2017-03-02 | Payer: Medicare Other | Source: Ambulatory Visit

## 2017-03-02 ENCOUNTER — Other Ambulatory Visit (HOSPITAL_COMMUNITY): Payer: Self-pay | Admitting: Internal Medicine

## 2017-03-02 ENCOUNTER — Other Ambulatory Visit (HOSPITAL_COMMUNITY): Payer: Self-pay | Admitting: Student

## 2017-03-06 ENCOUNTER — Encounter (HOSPITAL_COMMUNITY): Payer: BLUE CROSS/BLUE SHIELD

## 2017-03-07 ENCOUNTER — Encounter (HOSPITAL_COMMUNITY)
Admission: RE | Admit: 2017-03-07 | Discharge: 2017-03-07 | Disposition: A | Discharge status: Home / Self Care | Payer: BLUE CROSS/BLUE SHIELD | Source: Ambulatory Visit | Attending: Internal Medicine | Admitting: Internal Medicine

## 2017-03-07 VITALS — BP 118/74 | HR 81 | Ht 66.0 in | Wt 148.1 lb

## 2017-03-07 DIAGNOSIS — Z7901 Long term (current) use of anticoagulants: Secondary | ICD-10-CM | POA: Insufficient documentation

## 2017-03-07 DIAGNOSIS — K219 Gastro-esophageal reflux disease without esophagitis: Secondary | ICD-10-CM | POA: Insufficient documentation

## 2017-03-07 DIAGNOSIS — E119 Type 2 diabetes mellitus without complications: Secondary | ICD-10-CM | POA: Diagnosis not present

## 2017-03-07 DIAGNOSIS — I110 Hypertensive heart disease with heart failure: Secondary | ICD-10-CM | POA: Diagnosis not present

## 2017-03-07 DIAGNOSIS — Z79899 Other long term (current) drug therapy: Secondary | ICD-10-CM | POA: Insufficient documentation

## 2017-03-07 DIAGNOSIS — I4891 Unspecified atrial fibrillation: Secondary | ICD-10-CM | POA: Insufficient documentation

## 2017-03-07 DIAGNOSIS — I5022 Chronic systolic (congestive) heart failure: Secondary | ICD-10-CM | POA: Insufficient documentation

## 2017-03-07 NOTE — Progress Notes (Signed)
Cardiac Individual Treatment Plan  Patient Details  Name: Ricardo [name] MRN: [id number] Date of Birth: October 18, 1935 Referring Provider:     CARDIAC REHAB PHASE II ORIENTATION from 03/07/2017 in [place]  Referring Provider  Glori Bickers MD      Initial Encounter Date:    CARDIAC REHAB PHASE II ORIENTATION from 03/07/2017 in [place]  Date  03/07/17  Referring Provider  Glori Bickers MD      Visit Diagnosis: Chronic systolic congestive heart failure ([place])  Patient's Home Medications on Admission:  Current Outpatient Prescriptions:  .  apixaban (ELIQUIS) 2.5 MG TABS tablet, Take 1 tablet (2.5 mg total) by mouth 2 (two) times daily., Disp: 60 tablet, Rfl: 3 .  b complex vitamins tablet, Take 1 tablet by mouth daily., Disp: , Rfl:  .  Cholecalciferol (VITAMIN D3) 2000 units TABS, Take 2,000 Units by mouth daily., Disp: , Rfl:  .  Cinnamon 500 MG capsule, Take 1,000 mg by mouth daily., Disp: , Rfl:  .  Coenzyme Q10 (CO Q10) 200 MG CAPS, Take 200 mcg by mouth daily., Disp: , Rfl:  .  cyanocobalamin 100 MCG tablet, Take 1,000 mcg by mouth daily., Disp: , Rfl:  .  dexlansoprazole (DEXILANT) 60 MG capsule, Take 60 mg by mouth daily., Disp: , Rfl:  .  docusate sodium (COLACE) 100 MG capsule, Take 100 mg by mouth daily as needed for mild constipation., Disp: , Rfl:  .  empagliflozin (JARDIANCE) 10 MG TABS tablet, Take 10 mg by mouth daily., Disp: , Rfl:  .  hydrocortisone (ANUSOL-HC) 25 MG suppository, Place 1 suppository (25 mg total) rectally 2 (two) times daily., Disp: 12 suppository, Rfl: 0 .  Magnesium Oxide 250 MG TABS, Take 250 mg by mouth daily., Disp: , Rfl:  .  metoprolol succinate (TOPROL-XL) 25 MG 24 hr tablet, Take 1 tablet (25 mg total) by mouth daily., Disp: 30 tablet, Rfl: 6 .  potassium chloride SA (K-DUR,KLOR-CON) 20 MEQ tablet, TAKE 1 TABLET BY MOUTH DAILY, Disp: 30 tablet, Rfl: 3 .  ranitidine (ZANTAC)  150 MG capsule, Take 150 mg by mouth daily as needed for heartburn., Disp: , Rfl:  .  sacubitril-valsartan (ENTRESTO) 24-26 MG, Take 1 tablet by mouth 2 (two) times daily., Disp: 60 tablet, Rfl: 3 .  spironolactone (ALDACTONE) 25 MG tablet, TAKE 1 TABLET(25 MG) BY MOUTH DAILY, Disp: 30 tablet, Rfl: 3 .  torsemide (DEMADEX) 20 MG tablet, Take 20 mg by mouth daily., Disp: , Rfl: 6 .  vitamin C (ASCORBIC ACID) 250 MG tablet, Take 250 mg by mouth daily., Disp: , Rfl:  .  digoxin (LANOXIN) 0.125 MG tablet, Take 1 tablet (0.125 mg total) by mouth daily. (Patient not taking: Reported on 03/07/2017), Disp: 30 tablet, Rfl: 6  Past Medical History: Past Medical History:  Diagnosis Date  . Atrial fibrillation (HCC)    on coumadin.  cardiologist is in [facility] dr [name].  S/P multiple DCCVs and prior ablation.   Marland Kitchen BPH (benign prostatic hyperplasia)    by ST in 2000.   Marland Kitchen CHF (congestive heart failure) ([place])   . Complication of anesthesia    " sometimes slow to wake up"  . Diabetes mellitus without complication ([place])   . Diverticulitis    question if he ever had imaging confirmed diverticulitis  . GERD (gastroesophageal reflux disease)   . GI bleed   . Gout   . Hypertension   . Kidney stones  treated with lithotripsy   . Pneumonia   . PONV (postoperative nausea and vomiting)   . Rupture of biceps tendon 2006   left distal bicep tendon  . UTI (urinary tract infection)     Tobacco Use: History  Smoking Status  . Never Smoker  Smokeless Tobacco  . Never Used    Labs: Recent Review Flowsheet Data    Labs for ITP Cardiac and Pulmonary Rehab Latest Ref Rng & Units 11/15/2016 11/15/2016 11/15/2016 11/19/2016   Hemoglobin A1c 4.8 - 5.6 % - - - 6.7(H)   PHART 7.350 - 7.450 7.461(H) - - -   PCO2ART 32.0 - 48.0 mmHg 36.9 - - -   HCO3 20.0 - 28.0 mmol/L 26.3 26.8 29.4(H) -   TCO2 0 - 100 mmol/L 27 28 31  -   O2SAT % 98.0 68.0 70.0 -      Capillary Blood Glucose: Lab Results   Component Value Date   GLUCAP 119 (H) 11/19/2016   GLUCAP 115 (H) 11/19/2016   GLUCAP 131 (H) 11/19/2016   GLUCAP 120 (H) 11/19/2016   GLUCAP 127 (H) 11/06/2016     Exercise Target Goals: Date: 03/07/17  Exercise Program Goal: Individual exercise prescription set with THRR, safety & activity barriers. Participant demonstrates ability to understand and report RPE using BORG scale, to self-measure pulse accurately, and to acknowledge the importance of the exercise prescription.  Exercise Prescription Goal: Starting with aerobic activity 30 plus minutes a day, 3 days per week for initial exercise prescription. Provide home exercise prescription and guidelines that participant acknowledges understanding prior to discharge.  Activity Barriers & Risk Stratification:     Activity Barriers & Cardiac Risk Stratification - 03/07/17 1644      Activity Barriers & Cardiac Risk Stratification   Activity Barriers Deconditioning;Muscular Weakness   Cardiac Risk Stratification High      6 Minute Walk:     6 Minute Walk    Row Name 03/07/17 1640         6 Minute Walk   Phase Initial     Distance 1037 feet     Walk Time 6 minutes     # of Rest Breaks 0     MPH 1.96     METS 2.13     RPE 9     VO2 Peak 7.47     Symptoms No     Resting HR 81 bpm     Resting BP 118/74     Max Ex. HR 109 bpm     Max Ex. BP 120/84     2 Minute Post BP 125/88        Oxygen Initial Assessment:   Oxygen Re-Evaluation:   Oxygen Discharge (Final Oxygen Re-Evaluation):   Initial Exercise Prescription:     Initial Exercise Prescription - 03/07/17 1600      Date of Initial Exercise RX and Referring Provider   Date 03/07/17   Referring Provider Glori Bickers MD     Treadmill   MPH 1.8   Grade 0   Minutes 10   METs 2.38     Recumbant Bike   Level 2   Minutes 10   METs 1.7     NuStep   Level 2   SPM 80   Minutes 10   METs 1.5     Prescription Details   Frequency (times  per week) 3   Duration Progress to 30 minutes of continuous aerobic without signs/symptoms of physical distress  Intensity   THRR 40-80% of Max Heartrate 56-113   Ratings of Perceived Exertion 11-15   Perceived Dyspnea 0-4     Progression   Progression Continue to progress workloads to maintain intensity without signs/symptoms of physical distress.     Resistance Training   Training Prescription Yes   Weight 2lbs   Reps 10-15      Perform Capillary Blood Glucose checks as needed.  Exercise Prescription Changes:   Exercise Comments:   Exercise Goals and Review:     Exercise Goals    Row Name 03/07/17 1644             Exercise Goals   Increase Physical Activity Yes       Intervention Provide advice, education, support and counseling about physical activity/exercise needs.;Develop an individualized exercise prescription for aerobic and resistive training based on initial evaluation findings, risk stratification, comorbidities and participant's personal goals.       Expected Outcomes Achievement of increased cardiorespiratory fitness and enhanced flexibility, muscular endurance and strength shown through measurements of functional capacity and personal statement of participant.       Increase Strength and Stamina Yes  improve blood flow to heart, build on exercise tolerance. Increase walking distance       Intervention Provide advice, education, support and counseling about physical activity/exercise needs.;Develop an individualized exercise prescription for aerobic and resistive training based on initial evaluation findings, risk stratification, comorbidities and participant's personal goals.       Expected Outcomes Achievement of increased cardiorespiratory fitness and enhanced flexibility, muscular endurance and strength shown through measurements of functional capacity and personal statement of participant.          Exercise Goals Re-Evaluation :    Discharge  Exercise Prescription (Final Exercise Prescription Changes):   Nutrition:  Target Goals: Understanding of nutrition guidelines, daily intake of sodium 1500mg , cholesterol 200mg , calories 30% from fat and 7% or less from saturated fats, daily to have 5 or more servings of fruits and vegetables.  Biometrics:     Pre Biometrics - 03/07/17 1645      Pre Biometrics   Waist Circumference 35.25 inches   Hip Circumference 37.5 inches   Waist to Hip Ratio 0.94 %   Triceps Skinfold 19 mm   % Body Fat 25.5 %   Grip Strength 37 kg   Flexibility 8 in       Nutrition Therapy Plan and Nutrition Goals:   Nutrition Discharge: Nutrition Scores:   Nutrition Goals Re-Evaluation:   Nutrition Goals Re-Evaluation:   Nutrition Goals Discharge (Final Nutrition Goals Re-Evaluation):   Psychosocial: Target Goals: Acknowledge presence or absence of significant depression and/or stress, maximize coping skills, provide positive support system. Participant is able to verbalize types and ability to use techniques and skills needed for reducing stress and depression.  Initial Review & Psychosocial Screening:     Initial Psych Review & Screening - 03/07/17 1556      Initial Review   Current issues with None Identified     Family Dynamics   Good Support System? Yes  wife    Comments upon brief assessment, no psychosocial needs identified, no interventions necessary      Barriers   Psychosocial barriers to participate in program There are no identifiable barriers or psychosocial needs.     Screening Interventions   Interventions Encouraged to exercise;Provide feedback about the scores to participant      Quality of Life Scores:     Quality of Life -  03/07/17 1555      Quality of Life Scores   Health/Function Pre 28.4 %   Socioeconomic Pre 28.4 %   Psych/Spiritual Pre 28.7 %   Family Pre 30 %   GLOBAL Pre 28.7 %      PHQ-9: Recent Review Flowsheet Data    There is no  flowsheet data to display.     Interpretation of Total Score  Total Score Depression Severity:  1-4 = Minimal depression, 5-9 = Mild depression, 10-14 = Moderate depression, 15-19 = Moderately severe depression, 20-27 = Severe depression   Psychosocial Evaluation and Intervention:   Psychosocial Re-Evaluation:   Psychosocial Discharge (Final Psychosocial Re-Evaluation):   Vocational Rehabilitation: Provide vocational rehab assistance to qualifying candidates.   Vocational Rehab Evaluation & Intervention:     Vocational Rehab - 03/07/17 1658      Initial Vocational Rehab Evaluation & Intervention   Assessment shows need for Vocational Rehabilitation No      Education: Education Goals: Education classes will be provided on a weekly basis, covering required topics. Participant will state understanding/return demonstration of topics presented.  Learning Barriers/Preferences:     Learning Barriers/Preferences - 03/07/17 1423      Learning Barriers/Preferences   Learning Barriers Sight   Learning Preferences Skilled Demonstration;Verbal Instruction;Video      Education Topics: Count Your Pulse:  -Group instruction provided by verbal instruction, demonstration, patient participation and written materials to support subject.  Instructors address importance of being able to find your pulse and how to count your pulse when at home without a heart monitor.  Patients get hands on experience counting their pulse with staff help and individually.   Heart Attack, Angina, and Risk Factor Modification:  -Group instruction provided by verbal instruction, video, and written materials to support subject.  Instructors address signs and symptoms of angina and heart attacks.    Also discuss risk factors for heart disease and how to make changes to improve heart health risk factors.   Functional Fitness:  -Group instruction provided by verbal instruction, demonstration, patient  participation, and written materials to support subject.  Instructors address safety measures for doing things around the house.  Discuss how to get up and down off the floor, how to pick things up properly, how to safely get out of a chair without assistance, and balance training.   Meditation and Mindfulness:  -Group instruction provided by verbal instruction, patient participation, and written materials to support subject.  Instructor addresses importance of mindfulness and meditation practice to help reduce stress and improve awareness.  Instructor also leads participants through a meditation exercise.    Stretching for Flexibility and Mobility:  -Group instruction provided by verbal instruction, patient participation, and written materials to support subject.  Instructors lead participants through series of stretches that are designed to increase flexibility thus improving mobility.  These stretches are additional exercise for major muscle groups that are typically performed during regular warm up and cool down.   Hands Only CPR:  -Group verbal, video, and participation provides a basic overview of AHA guidelines for community CPR. Role-play of emergencies allow participants the opportunity to practice calling for help and chest compression technique with discussion of AED use.   Hypertension: -Group verbal and written instruction that provides a basic overview of hypertension including the most recent diagnostic guidelines, risk factor reduction with self-care instructions and medication management.    Nutrition I class: Heart Healthy Eating:  -Group instruction provided by PowerPoint slides, verbal discussion, and written materials  to support subject matter. The instructor gives an explanation and review of the Therapeutic Lifestyle Changes diet recommendations, which includes a discussion on lipid goals, dietary fat, sodium, fiber, plant stanol/sterol esters, sugar, and the components of  a well-balanced, healthy diet.   Nutrition II class: Lifestyle Skills:  -Group instruction provided by PowerPoint slides, verbal discussion, and written materials to support subject matter. The instructor gives an explanation and review of label reading, grocery shopping for heart health, heart healthy recipe modifications, and ways to make healthier choices when eating out.   Diabetes Question & Answer:  -Group instruction provided by PowerPoint slides, verbal discussion, and written materials to support subject matter. The instructor gives an explanation and review of diabetes co-morbidities, pre- and post-prandial blood glucose goals, pre-exercise blood glucose goals, signs, symptoms, and treatment of hypoglycemia and hyperglycemia, and foot care basics.   Diabetes Blitz:  -Group instruction provided by PowerPoint slides, verbal discussion, and written materials to support subject matter. The instructor gives an explanation and review of the physiology behind type 1 and type 2 diabetes, diabetes medications and rational behind using different medications, pre- and post-prandial blood glucose recommendations and Hemoglobin A1c goals, diabetes diet, and exercise including blood glucose guidelines for exercising safely.    Portion Distortion:  -Group instruction provided by PowerPoint slides, verbal discussion, written materials, and food models to support subject matter. The instructor gives an explanation of serving size versus portion size, changes in portions sizes over the last 20 years, and what consists of a serving from each food group.   Stress Management:  -Group instruction provided by verbal instruction, video, and written materials to support subject matter.  Instructors review role of stress in heart disease and how to cope with stress positively.     Exercising on Your Own:  -Group instruction provided by verbal instruction, power point, and written materials to support  subject.  Instructors discuss benefits of exercise, components of exercise, frequency and intensity of exercise, and end points for exercise.  Also discuss use of nitroglycerin and activating EMS.  Review options of places to exercise outside of rehab.  Review guidelines for sex with heart disease.   Cardiac Drugs I:  -Group instruction provided by verbal instruction and written materials to support subject.  Instructor reviews cardiac drug classes: antiplatelets, anticoagulants, beta blockers, and statins.  Instructor discusses reasons, side effects, and lifestyle considerations for each drug class.   Cardiac Drugs II:  -Group instruction provided by verbal instruction and written materials to support subject.  Instructor reviews cardiac drug classes: angiotensin converting enzyme inhibitors (ACE-I), angiotensin II receptor blockers (ARBs), nitrates, and calcium channel blockers.  Instructor discusses reasons, side effects, and lifestyle considerations for each drug class.   Anatomy and Physiology of the Circulatory System:  Group verbal and written instruction and models provide basic cardiac anatomy and physiology, with the coronary electrical and arterial systems. Review of: AMI, Angina, Valve disease, Heart Failure, Peripheral Artery Disease, Cardiac Arrhythmia, Pacemakers, and the ICD.   Other Education:  -Group or individual verbal, written, or video instructions that support the educational goals of the cardiac rehab program.   Knowledge Questionnaire Score:     Knowledge Questionnaire Score - 03/07/17 1555      Knowledge Questionnaire Score   Pre Score 21/24      Core Components/Risk Factors/Patient Goals at Admission:     Personal Goals and Risk Factors at Admission - 03/07/17 1648      Core Components/Risk Factors/Patient Goals on Admission  Diabetes Yes   Intervention Provide education about signs/symptoms and action to take for hypo/hyperglycemia.;Provide education  about proper nutrition, including hydration, and aerobic/resistive exercise prescription along with prescribed medications to achieve blood glucose in normal ranges: Fasting glucose 65-99 mg/dL   Expected Outcomes Short Term: Participant verbalizes understanding of the signs/symptoms and immediate care of hyper/hypoglycemia, proper foot care and importance of medication, aerobic/resistive exercise and nutrition plan for blood glucose control.;Long Term: Attainment of HbA1C < 7%.   Hypertension Yes   Intervention Provide education on lifestyle modifcations including regular physical activity/exercise, weight management, moderate sodium restriction and increased consumption of fresh fruit, vegetables, and low fat dairy, alcohol moderation, and smoking cessation.;Monitor prescription use compliance.   Expected Outcomes Short Term: Continued assessment and intervention until BP is < 140/68mm HG in hypertensive participants. < 130/57mm HG in hypertensive participants with diabetes, heart failure or chronic kidney disease.;Long Term: Maintenance of blood pressure at goal levels.   Stress Yes   Intervention Offer individual and/or small group education and counseling on adjustment to heart disease, stress management and health-related lifestyle change. Teach and support self-help strategies.;Refer participants experiencing significant psychosocial distress to appropriate mental health specialists for further evaluation and treatment. When possible, include family members and significant others in education/counseling sessions.   Expected Outcomes Short Term: Participant demonstrates changes in health-related behavior, relaxation and other stress management skills, ability to obtain effective social support, and compliance with psychotropic medications if prescribed.;Long Term: Emotional wellbeing is indicated by absence of clinically significant psychosocial distress or social isolation.      Core  Components/Risk Factors/Patient Goals Review:    Core Components/Risk Factors/Patient Goals at Discharge (Final Review):    ITP Comments:     ITP Comments    Row Name 03/07/17 1401           ITP Comments Dr. Fransico Him, Medical Director          Comments: Patient attended orientation from 1357  to 1457 to review rules and guidelines for program. Completed 6 minute walk test, Intitial ITP, and exercise prescription.  VSS. Telemetry-atrial fibrillation.  Asymptomatic.

## 2017-03-08 ENCOUNTER — Encounter (HOSPITAL_COMMUNITY): Payer: BLUE CROSS/BLUE SHIELD

## 2017-03-10 ENCOUNTER — Encounter (HOSPITAL_COMMUNITY): Payer: BLUE CROSS/BLUE SHIELD

## 2017-03-13 ENCOUNTER — Encounter (HOSPITAL_COMMUNITY): Payer: Self-pay

## 2017-03-13 ENCOUNTER — Encounter (HOSPITAL_COMMUNITY): Payer: BLUE CROSS/BLUE SHIELD

## 2017-03-15 ENCOUNTER — Encounter (HOSPITAL_COMMUNITY)
Admission: RE | Admit: 2017-03-15 | Discharge: 2017-03-15 | Disposition: A | Discharge status: Home / Self Care | Payer: BLUE CROSS/BLUE SHIELD | Source: Ambulatory Visit | Attending: Internal Medicine | Admitting: Internal Medicine

## 2017-03-15 DIAGNOSIS — Z79899 Other long term (current) drug therapy: Secondary | ICD-10-CM | POA: Insufficient documentation

## 2017-03-15 DIAGNOSIS — Z7901 Long term (current) use of anticoagulants: Secondary | ICD-10-CM | POA: Diagnosis not present

## 2017-03-15 DIAGNOSIS — K219 Gastro-esophageal reflux disease without esophagitis: Secondary | ICD-10-CM | POA: Insufficient documentation

## 2017-03-15 DIAGNOSIS — I5022 Chronic systolic (congestive) heart failure: Secondary | ICD-10-CM | POA: Insufficient documentation

## 2017-03-15 DIAGNOSIS — E119 Type 2 diabetes mellitus without complications: Secondary | ICD-10-CM | POA: Insufficient documentation

## 2017-03-15 DIAGNOSIS — I4891 Unspecified atrial fibrillation: Secondary | ICD-10-CM | POA: Diagnosis not present

## 2017-03-15 DIAGNOSIS — I110 Hypertensive heart disease with heart failure: Secondary | ICD-10-CM | POA: Diagnosis not present

## 2017-03-15 LAB — GLUCOSE, CAPILLARY
GLUCOSE-CAPILLARY: 90 mg/dL (ref 65–99)
GLUCOSE-CAPILLARY: 102 mg/dL — AB (ref 65–99)

## 2017-03-15 NOTE — Progress Notes (Signed)
Daily Session Note  Patient Details  Name: Ricardo [name] MRN: [id number] Date of Birth: 05/24/1936 Referring Provider:     CARDIAC REHAB PHASE II ORIENTATION from 03/07/2017 in [facility]  Referring Provider  Bensimhon, Daniel MD      Encounter Date: 03/15/2017  Check In:     Session Check In - 03/15/17 1534      Check-In   Location [facility]   Staff Present Amber Fair, MS, ACSM RCEP, Exercise Physiologist;Joann Rion, RN, BSN;Maria Whitaker, RN, BSN;Ashley Armstrong, MS, Exercise Physiologist   Supervising physician immediately available to respond to emergencies [facility] immediately available   Physician(s) Dr. Schertz   Medication changes reported     No   Fall or balance concerns reported    No   Tobacco Cessation No Change   Warm-up and Cool-down Performed as group-led instruction   Resistance Training Performed No   VAD Patient? No     Pain Assessment   Currently in Pain? No/denies   Multiple Pain Sites No      Capillary Blood Glucose: Results for orders placed or performed during the hospital encounter of 03/15/17 (from the past 24 hour(s))  Glucose, capillary     Status: None   Collection Time: 03/15/17  3:16 PM  Result Value Ref Range   Glucose-Capillary 90 65 - 99 mg/dL  Glucose, capillary     Status: Abnormal   Collection Time: 03/15/17  3:53 PM  Result Value Ref Range   Glucose-Capillary 102 (H) 65 - 99 mg/dL      History  Smoking Status  . Never Smoker  Smokeless Tobacco  . Never Used    Goals Met:  No report of cardiac concerns or symptoms  Goals Unmet:  Not Applicable  Comments: [name] started cardiac rehab today.  Pt tolerated light exercise without difficulty. VSS, telemetry-Atrial fibrillation, bundle branch block, asymptomatic.  Medication list reconciled. Pt denies barriers to medicaiton compliance.  PSYCHOSOCIAL ASSESSMENT:  PHQ-0. Pt exhibits positive coping skills, hopeful outlook  with supportive family. No psychosocial needs identified at this time, no psychosocial interventions necessary.    Pt enjoys going to work and fishing.   Pt oriented to exercise equipment and routine.    Understanding verbalized. [name]'s CBG was 90 this afternoon. [name] ate breakfast this morning but did not eat lunch. [name] was given a banana and Ginger ale. Recheck CBG 102. [name] was counseled by the dietitian. Patient was instructed to make sure he eats lunch prior to coming to exercise at cardiac rehab. Patient states understanding.Maria Whitaker, RN,BSN 03/16/2017 10:16 AM   Dr. Traci Turner is Medical Director for Cardiac Rehab at [facility]. 

## 2017-03-15 NOTE — Progress Notes (Signed)
Jamonta Goerner 81 y.o. male DOB [birth year] MRN [id number]       Nutrition Screen & Note  Dx: Systolic CHF  Past Medical History:  Diagnosis Date  . Atrial fibrillation (HCC)    on coumadin.  cardiologist is in [facility] dr [name].  S/P multiple DCCVs and prior ablation.   Marland Kitchen BPH (benign prostatic hyperplasia)    by ST in 2000.   Marland Kitchen CHF (congestive heart failure) ([place])   . Complication of anesthesia    " sometimes slow to wake up"  . Diabetes mellitus without complication ([place])   . Diverticulitis    question if he ever had imaging confirmed diverticulitis  . GERD (gastroesophageal reflux disease)   . GI bleed   . Gout   . Hypertension   . Kidney stones    treated with lithotripsy   . Pneumonia   . PONV (postoperative nausea and vomiting)   . Rupture of biceps tendon 2006   left distal bicep tendon  . UTI (urinary tract infection)    Meds reviewed.  HT: Ht Readings from Last 1 Encounters:  03/07/17 5\' 6"  (1.676 m)    WT: Wt Readings from Last 3 Encounters:  03/07/17 148 lb 2.4 oz (67.2 kg)  02/07/17 143 lb 12 oz (65.2 kg)  12/07/16 148 lb 4 oz (67.2 kg)     BMI 24.0   Current tobacco use? No     Labs:  Lipid Panel  No results found for: CHOL, TRIG, HDL, CHOLHDL, VLDL, LDLCALC, LDLDIRECT  Lab Results  Component Value Date   HGBA1C 6.7 (H) 11/19/2016   CBG (last 3)  No results for input(s): GLUCAP in the last 72 hours.  Nutrition Diagnosis ? Food-and nutrition-related knowledge deficit related to lack of exposure to information as related to diagnosis of: ? CVD ? DM  Nutrition Goal(s):  ? Pt to describe the benefit of including fruits, vegetables, whole grains, and low-fat dairy products in a heart healthy meal plan.  Plan:  Pt to attend nutrition classes ? Nutrition I ? Nutrition II ? Portion Distortion  ? Diabetes Blitz ? Diabetes Q & A Will provide client-centered nutrition education as part of interdisciplinary care.   Monitor and evaluate  progress toward nutrition goal with team.  Derek Mound, M.Ed, RD, LDN, CDE 03/15/2017 9:34 AM

## 2017-03-17 ENCOUNTER — Encounter (HOSPITAL_COMMUNITY)
Admission: RE | Admit: 2017-03-17 | Discharge: 2017-03-17 | Disposition: A | Discharge status: Home / Self Care | Payer: BLUE CROSS/BLUE SHIELD | Source: Ambulatory Visit | Attending: Internal Medicine | Admitting: Internal Medicine

## 2017-03-17 DIAGNOSIS — I5022 Chronic systolic (congestive) heart failure: Secondary | ICD-10-CM

## 2017-03-17 DIAGNOSIS — I110 Hypertensive heart disease with heart failure: Secondary | ICD-10-CM | POA: Diagnosis not present

## 2017-03-17 LAB — GLUCOSE, CAPILLARY: Glucose-Capillary: 112 mg/dL — ABNORMAL HIGH (ref 65–99)

## 2017-03-20 ENCOUNTER — Encounter (HOSPITAL_COMMUNITY): Payer: BLUE CROSS/BLUE SHIELD

## 2017-03-22 ENCOUNTER — Telehealth (HOSPITAL_COMMUNITY): Payer: Self-pay | Admitting: Internal Medicine

## 2017-03-22 ENCOUNTER — Encounter (HOSPITAL_COMMUNITY): Payer: BLUE CROSS/BLUE SHIELD

## 2017-03-24 ENCOUNTER — Encounter (HOSPITAL_COMMUNITY): Payer: BLUE CROSS/BLUE SHIELD

## 2017-03-27 ENCOUNTER — Encounter (HOSPITAL_COMMUNITY)
Admission: RE | Admit: 2017-03-27 | Discharge: 2017-03-27 | Disposition: A | Discharge status: Home / Self Care | Payer: BLUE CROSS/BLUE SHIELD | Source: Ambulatory Visit | Attending: Internal Medicine | Admitting: Internal Medicine

## 2017-03-27 DIAGNOSIS — I5022 Chronic systolic (congestive) heart failure: Secondary | ICD-10-CM

## 2017-03-27 DIAGNOSIS — I110 Hypertensive heart disease with heart failure: Secondary | ICD-10-CM | POA: Diagnosis not present

## 2017-03-27 LAB — GLUCOSE, CAPILLARY: GLUCOSE-CAPILLARY: 93 mg/dL (ref 65–99)

## 2017-03-29 ENCOUNTER — Encounter (HOSPITAL_COMMUNITY)
Admission: RE | Admit: 2017-03-29 | Discharge: 2017-03-29 | Disposition: A | Discharge status: Home / Self Care | Payer: BLUE CROSS/BLUE SHIELD | Source: Ambulatory Visit | Attending: Internal Medicine | Admitting: Internal Medicine

## 2017-03-29 VITALS — BP 112/82 | HR 82 | Wt 154.3 lb

## 2017-03-29 DIAGNOSIS — I5022 Chronic systolic (congestive) heart failure: Secondary | ICD-10-CM

## 2017-03-29 DIAGNOSIS — I110 Hypertensive heart disease with heart failure: Secondary | ICD-10-CM | POA: Diagnosis not present

## 2017-03-29 LAB — GLUCOSE, CAPILLARY: GLUCOSE-CAPILLARY: 125 mg/dL — AB (ref 65–99)

## 2017-03-30 NOTE — Progress Notes (Signed)
Cardiac Individual Treatment Plan  Patient Details  Name: Ricardo [name] MRN: [id number] Date of Birth: Jan 02, 1936 Referring Provider:     CARDIAC REHAB PHASE II EXERCISE from 03/27/2017 in [place]  Referring Provider  (P) Glori Bickers MD      Initial Encounter Date:    CARDIAC REHAB PHASE II EXERCISE from 03/27/2017 in [place]  Date  (P) 03/07/17  Referring Provider  (P) Haroldine Laws, Daniel MD      Visit Diagnosis: Chronic systolic congestive heart failure ([place])  Patient's Home Medications on Admission:  Current Outpatient Prescriptions:  .  apixaban (ELIQUIS) 2.5 MG TABS tablet, Take 1 tablet (2.5 mg total) by mouth 2 (two) times daily., Disp: 60 tablet, Rfl: 3 .  b complex vitamins tablet, Take 1 tablet by mouth daily., Disp: , Rfl:  .  Cholecalciferol (VITAMIN D3) 2000 units TABS, Take 2,000 Units by mouth daily., Disp: , Rfl:  .  Cinnamon 500 MG capsule, Take 1,000 mg by mouth daily., Disp: , Rfl:  .  Coenzyme Q10 (CO Q10) 200 MG CAPS, Take 200 mcg by mouth daily., Disp: , Rfl:  .  cyanocobalamin 100 MCG tablet, Take 1,000 mcg by mouth daily., Disp: , Rfl:  .  dexlansoprazole (DEXILANT) 60 MG capsule, Take 60 mg by mouth daily., Disp: , Rfl:  .  digoxin (LANOXIN) 0.125 MG tablet, Take 1 tablet (0.125 mg total) by mouth daily. (Patient not taking: Reported on 03/07/2017), Disp: 30 tablet, Rfl: 6 .  docusate sodium (COLACE) 100 MG capsule, Take 100 mg by mouth daily as needed for mild constipation., Disp: , Rfl:  .  empagliflozin (JARDIANCE) 10 MG TABS tablet, Take 10 mg by mouth daily., Disp: , Rfl:  .  hydrocortisone (ANUSOL-HC) 25 MG suppository, Place 1 suppository (25 mg total) rectally 2 (two) times daily. (Patient not taking: Reported on 03/15/2017), Disp: 12 suppository, Rfl: 0 .  Magnesium Oxide 250 MG TABS, Take 250 mg by mouth daily., Disp: , Rfl:  .  metoprolol succinate (TOPROL-XL) 25 MG 24 hr tablet,  Take 1 tablet (25 mg total) by mouth daily., Disp: 30 tablet, Rfl: 6 .  potassium chloride SA (K-DUR,KLOR-CON) 20 MEQ tablet, TAKE 1 TABLET BY MOUTH DAILY, Disp: 30 tablet, Rfl: 3 .  ranitidine (ZANTAC) 150 MG capsule, Take 150 mg by mouth daily as needed for heartburn., Disp: , Rfl:  .  sacubitril-valsartan (ENTRESTO) 24-26 MG, Take 1 tablet by mouth 2 (two) times daily., Disp: 60 tablet, Rfl: 3 .  spironolactone (ALDACTONE) 25 MG tablet, TAKE 1 TABLET(25 MG) BY MOUTH DAILY, Disp: 30 tablet, Rfl: 3 .  torsemide (DEMADEX) 20 MG tablet, Take 20 mg by mouth daily., Disp: , Rfl: 6 .  vitamin C (ASCORBIC ACID) 250 MG tablet, Take 250 mg by mouth daily., Disp: , Rfl:   Past Medical History: Past Medical History:  Diagnosis Date  . Atrial fibrillation (HCC)    on coumadin.  cardiologist is in [facility] dr [name].  S/P multiple DCCVs and prior ablation.   Marland Kitchen BPH (benign prostatic hyperplasia)    by ST in 2000.   Marland Kitchen CHF (congestive heart failure) ([place])   . Complication of anesthesia    " sometimes slow to wake up"  . Diabetes mellitus without complication ([place])   . Diverticulitis    question if he ever had imaging confirmed diverticulitis  . GERD (gastroesophageal reflux disease)   . GI bleed   . Gout   .  Hypertension   . Kidney stones    treated with lithotripsy   . Pneumonia   . PONV (postoperative nausea and vomiting)   . Rupture of biceps tendon 2006   left distal bicep tendon  . UTI (urinary tract infection)     Tobacco Use: History  Smoking Status  . Never Smoker  Smokeless Tobacco  . Never Used    Labs: Recent Review Flowsheet Data    Labs for ITP Cardiac and Pulmonary Rehab Latest Ref Rng & Units 11/15/2016 11/15/2016 11/15/2016 11/19/2016   Hemoglobin A1c 4.8 - 5.6 % - - - 6.7(H)   PHART 7.350 - 7.450 7.461(H) - - -   PCO2ART 32.0 - 48.0 mmHg 36.9 - - -   HCO3 20.0 - 28.0 mmol/L 26.3 26.8 29.4(H) -   TCO2 0 - 100 mmol/L 27 28 31  -   O2SAT % 98.0 68.0 70.0 -       Capillary Blood Glucose: Lab Results  Component Value Date   GLUCAP 125 (H) 03/29/2017   GLUCAP 93 03/27/2017   GLUCAP 112 (H) 03/17/2017   GLUCAP 102 (H) 03/15/2017   GLUCAP 90 03/15/2017     Exercise Target Goals:    Exercise Program Goal: Individual exercise prescription set with THRR, safety & activity barriers. Participant demonstrates ability to understand and report RPE using BORG scale, to self-measure pulse accurately, and to acknowledge the importance of the exercise prescription.  Exercise Prescription Goal: Starting with aerobic activity 30 plus minutes a day, 3 days per week for initial exercise prescription. Provide home exercise prescription and guidelines that participant acknowledges understanding prior to discharge.  Activity Barriers & Risk Stratification:     Activity Barriers & Cardiac Risk Stratification - 03/07/17 1644      Activity Barriers & Cardiac Risk Stratification   Activity Barriers Deconditioning;Muscular Weakness   Cardiac Risk Stratification High      6 Minute Walk:     6 Minute Walk    Row Name 03/07/17 1640         6 Minute Walk   Phase Initial     Distance 1037 feet     Walk Time 6 minutes     # of Rest Breaks 0     MPH 1.96     METS 2.13     RPE 9     VO2 Peak 7.47     Symptoms No     Resting HR 81 bpm     Resting BP 118/74     Max Ex. HR 109 bpm     Max Ex. BP 120/84     2 Minute Post BP 125/88        Oxygen Initial Assessment:   Oxygen Re-Evaluation:   Oxygen Discharge (Final Oxygen Re-Evaluation):   Initial Exercise Prescription:     Initial Exercise Prescription - 03/24/17 1400      Date of Initial Exercise RX and Referring Provider   Date (P)  03/07/17   Referring Provider (P)  Bensimhon, Daniel MD     Treadmill   MPH 1.8   Grade 0   Minutes 10   METs 2.38     Recumbant Bike   Level 2   Minutes 10     NuStep   Level 2   SPM 80   Minutes 10     Prescription Details   Frequency  (times per week) (P)  3     Intensity   THRR 40-80% of Max Heartrate (P)  74-259   Ratings of Perceived Exertion (P)  11-15   Perceived Dyspnea (P)  0-4     Progression   Progression (P)  Continue to progress workloads to maintain intensity without signs/symptoms of physical distress.     Resistance Training   Training Prescription Yes   Weight 2lbs   Reps 10-15      Perform Capillary Blood Glucose checks as needed.  Exercise Prescription Changes:     Exercise Prescription Changes    Row Name 03/24/17 1400             Response to Exercise   Blood Pressure (Admit) 134/84       Blood Pressure (Exercise) 148/84       Blood Pressure (Exit) 100/72       Heart Rate (Admit) 81 bpm       Heart Rate (Exercise) 108 bpm       Heart Rate (Exit) 76 bpm       Rating of Perceived Exertion (Exercise) 12       Duration Progress to 45 minutes of aerobic exercise without signs/symptoms of physical distress       Intensity THRR unchanged         Progression   Progression Continue to progress workloads to maintain intensity without signs/symptoms of physical distress.       Average METs 2.2         Recumbant Bike   METs 2         NuStep   METs 2          Exercise Comments:     Exercise Comments    Row Name 03/24/17 1618           Exercise Comments Pt is off to a great start with exercise!          Exercise Goals and Review:     Exercise Goals    Row Name 03/07/17 1644             Exercise Goals   Increase Physical Activity Yes       Intervention Provide advice, education, support and counseling about physical activity/exercise needs.;Develop an individualized exercise prescription for aerobic and resistive training based on initial evaluation findings, risk stratification, comorbidities and participant's personal goals.       Expected Outcomes Achievement of increased cardiorespiratory fitness and enhanced flexibility, muscular endurance and strength shown  through measurements of functional capacity and personal statement of participant.       Increase Strength and Stamina Yes  improve blood flow to heart, build on exercise tolerance. Increase walking distance       Intervention Provide advice, education, support and counseling about physical activity/exercise needs.;Develop an individualized exercise prescription for aerobic and resistive training based on initial evaluation findings, risk stratification, comorbidities and participant's personal goals.       Expected Outcomes Achievement of increased cardiorespiratory fitness and enhanced flexibility, muscular endurance and strength shown through measurements of functional capacity and personal statement of participant.          Exercise Goals Re-Evaluation :    Discharge Exercise Prescription (Final Exercise Prescription Changes):     Exercise Prescription Changes - 03/24/17 1400      Response to Exercise   Blood Pressure (Admit) 134/84   Blood Pressure (Exercise) 148/84   Blood Pressure (Exit) 100/72   Heart Rate (Admit) 81 bpm   Heart Rate (Exercise) 108 bpm   Heart Rate (Exit) 76 bpm   Rating of  Perceived Exertion (Exercise) 12   Duration Progress to 45 minutes of aerobic exercise without signs/symptoms of physical distress   Intensity THRR unchanged     Progression   Progression Continue to progress workloads to maintain intensity without signs/symptoms of physical distress.   Average METs 2.2     Recumbant Bike   METs 2     NuStep   METs 2      Nutrition:  Target Goals: Understanding of nutrition guidelines, daily intake of sodium 1500mg , cholesterol 200mg , calories 30% from fat and 7% or less from saturated fats, daily to have 5 or more servings of fruits and vegetables.  Biometrics:     Pre Biometrics - 03/07/17 1645      Pre Biometrics   Waist Circumference 35.25 inches   Hip Circumference 37.5 inches   Waist to Hip Ratio 0.94 %   Triceps Skinfold 19 mm    % Body Fat 25.5 %   Grip Strength 37 kg   Flexibility 8 in       Nutrition Therapy Plan and Nutrition Goals:     Nutrition Therapy & Goals - 03/15/17 0938      Nutrition Therapy   Diet Therapeutic Lifestyle Changes     Personal Nutrition Goals   Nutrition Goal Pt to describe the benefit of including fruits, vegetables, whole grains, and low-fat dairy products in a heart healthy meal plan.     Intervention Plan   Intervention Prescribe, educate and counsel regarding individualized specific dietary modifications aiming towards targeted core components such as weight, hypertension, lipid management, diabetes, heart failure and other comorbidities.   Expected Outcomes Short Term Goal: Understand basic principles of dietary content, such as calories, fat, sodium, cholesterol and nutrients.;Long Term Goal: Adherence to prescribed nutrition plan.      Nutrition Discharge: Nutrition Scores:     Nutrition Assessments - 03/15/17 0952      MEDFICTS Scores   Pre Score 17      Nutrition Goals Re-Evaluation:   Nutrition Goals Re-Evaluation:   Nutrition Goals Discharge (Final Nutrition Goals Re-Evaluation):   Psychosocial: Target Goals: Acknowledge presence or absence of significant depression and/or stress, maximize coping skills, provide positive support system. Participant is able to verbalize types and ability to use techniques and skills needed for reducing stress and depression.  Initial Review & Psychosocial Screening:     Initial Psych Review & Screening - 03/07/17 1556      Initial Review   Current issues with None Identified     Family Dynamics   Good Support System? Yes  wife    Comments upon brief assessment, no psychosocial needs identified, no interventions necessary      Barriers   Psychosocial barriers to participate in program There are no identifiable barriers or psychosocial needs.     Screening Interventions   Interventions Encouraged to  exercise;Provide feedback about the scores to participant      Quality of Life Scores:     Quality of Life - 03/07/17 1555      Quality of Life Scores   Health/Function Pre 28.4 %   Socioeconomic Pre 28.4 %   Psych/Spiritual Pre 28.7 %   Family Pre 30 %   GLOBAL Pre 28.7 %      PHQ-9: Recent Review Flowsheet Data    Depression screen [facility] 2/9 03/15/2017   Decreased Interest 0   Down, Depressed, Hopeless 0   PHQ - 2 Score 0     Interpretation of Total Score  Total Score Depression Severity:  1-4 = Minimal depression, 5-9 = Mild depression, 10-14 = Moderate depression, 15-19 = Moderately severe depression, 20-27 = Severe depression   Psychosocial Evaluation and Intervention:   Psychosocial Re-Evaluation:     Psychosocial Re-Evaluation    Row Name 03/30/17 1125             Psychosocial Re-Evaluation   Current issues with None Identified       Interventions Encouraged to attend Cardiac Rehabilitation for the exercise       Continue Psychosocial Services  No Follow up required          Psychosocial Discharge (Final Psychosocial Re-Evaluation):     Psychosocial Re-Evaluation - 03/30/17 1125      Psychosocial Re-Evaluation   Current issues with None Identified   Interventions Encouraged to attend Cardiac Rehabilitation for the exercise   Continue Psychosocial Services  No Follow up required      Vocational Rehabilitation: Provide vocational rehab assistance to qualifying candidates.   Vocational Rehab Evaluation & Intervention:     Vocational Rehab - 03/07/17 1658      Initial Vocational Rehab Evaluation & Intervention   Assessment shows need for Vocational Rehabilitation No      Education: Education Goals: Education classes will be provided on a weekly basis, covering required topics. Participant will state understanding/return demonstration of topics presented.  Learning Barriers/Preferences:     Learning Barriers/Preferences - 03/07/17 1423       Learning Barriers/Preferences   Learning Barriers Sight   Learning Preferences Skilled Demonstration;Verbal Instruction;Video      Education Topics: Count Your Pulse:  -Group instruction provided by verbal instruction, demonstration, patient participation and written materials to support subject.  Instructors address importance of being able to find your pulse and how to count your pulse when at home without a heart monitor.  Patients get hands on experience counting their pulse with staff help and individually.   Heart Attack, Angina, and Risk Factor Modification:  -Group instruction provided by verbal instruction, video, and written materials to support subject.  Instructors address signs and symptoms of angina and heart attacks.    Also discuss risk factors for heart disease and how to make changes to improve heart health risk factors.   Functional Fitness:  -Group instruction provided by verbal instruction, demonstration, patient participation, and written materials to support subject.  Instructors address safety measures for doing things around the house.  Discuss how to get up and down off the floor, how to pick things up properly, how to safely get out of a chair without assistance, and balance training.   Meditation and Mindfulness:  -Group instruction provided by verbal instruction, patient participation, and written materials to support subject.  Instructor addresses importance of mindfulness and meditation practice to help reduce stress and improve awareness.  Instructor also leads participants through a meditation exercise.    CARDIAC REHAB PHASE II EXERCISE from 03/29/2017 in [place]  Date  03/29/17  Instruction Review Code  2- meets goals/outcomes      Stretching for Flexibility and Mobility:  -Group instruction provided by verbal instruction, patient participation, and written materials to support subject.  Instructors lead  participants through series of stretches that are designed to increase flexibility thus improving mobility.  These stretches are additional exercise for major muscle groups that are typically performed during regular warm up and cool down.   Hands Only CPR:  -Group verbal, video, and participation provides a basic overview  of AHA guidelines for community CPR. Role-play of emergencies allow participants the opportunity to practice calling for help and chest compression technique with discussion of AED use.   Hypertension: -Group verbal and written instruction that provides a basic overview of hypertension including the most recent diagnostic guidelines, risk factor reduction with self-care instructions and medication management.    Nutrition I class: Heart Healthy Eating:  -Group instruction provided by PowerPoint slides, verbal discussion, and written materials to support subject matter. The instructor gives an explanation and review of the Therapeutic Lifestyle Changes diet recommendations, which includes a discussion on lipid goals, dietary fat, sodium, fiber, plant stanol/sterol esters, sugar, and the components of a well-balanced, healthy diet.   Nutrition II class: Lifestyle Skills:  -Group instruction provided by PowerPoint slides, verbal discussion, and written materials to support subject matter. The instructor gives an explanation and review of label reading, grocery shopping for heart health, heart healthy recipe modifications, and ways to make healthier choices when eating out.   Diabetes Question & Answer:  -Group instruction provided by PowerPoint slides, verbal discussion, and written materials to support subject matter. The instructor gives an explanation and review of diabetes co-morbidities, pre- and post-prandial blood glucose goals, pre-exercise blood glucose goals, signs, symptoms, and treatment of hypoglycemia and hyperglycemia, and foot care basics.   Diabetes Blitz:   -Group instruction provided by PowerPoint slides, verbal discussion, and written materials to support subject matter. The instructor gives an explanation and review of the physiology behind type 1 and type 2 diabetes, diabetes medications and rational behind using different medications, pre- and post-prandial blood glucose recommendations and Hemoglobin A1c goals, diabetes diet, and exercise including blood glucose guidelines for exercising safely.    Portion Distortion:  -Group instruction provided by PowerPoint slides, verbal discussion, written materials, and food models to support subject matter. The instructor gives an explanation of serving size versus portion size, changes in portions sizes over the last 20 years, and what consists of a serving from each food group.   Stress Management:  -Group instruction provided by verbal instruction, video, and written materials to support subject matter.  Instructors review role of stress in heart disease and how to cope with stress positively.     Exercising on Your Own:  -Group instruction provided by verbal instruction, power point, and written materials to support subject.  Instructors discuss benefits of exercise, components of exercise, frequency and intensity of exercise, and end points for exercise.  Also discuss use of nitroglycerin and activating EMS.  Review options of places to exercise outside of rehab.  Review guidelines for sex with heart disease.   Cardiac Drugs I:  -Group instruction provided by verbal instruction and written materials to support subject.  Instructor reviews cardiac drug classes: antiplatelets, anticoagulants, beta blockers, and statins.  Instructor discusses reasons, side effects, and lifestyle considerations for each drug class.   Cardiac Drugs II:  -Group instruction provided by verbal instruction and written materials to support subject.  Instructor reviews cardiac drug classes: angiotensin converting enzyme  inhibitors (ACE-I), angiotensin II receptor blockers (ARBs), nitrates, and calcium channel blockers.  Instructor discusses reasons, side effects, and lifestyle considerations for each drug class.   Anatomy and Physiology of the Circulatory System:  Group verbal and written instruction and models provide basic cardiac anatomy and physiology, with the coronary electrical and arterial systems. Review of: AMI, Angina, Valve disease, Heart Failure, Peripheral Artery Disease, Cardiac Arrhythmia, Pacemakers, and the ICD.   CARDIAC REHAB PHASE II EXERCISE from 03/29/2017  in [place]  Date  03/15/17  Instruction Review Code  2- meets goals/outcomes      Other Education:  -Group or individual verbal, written, or video instructions that support the educational goals of the cardiac rehab program.   Knowledge Questionnaire Score:     Knowledge Questionnaire Score - 03/15/17 0953      Knowledge Questionnaire Score   Pre Score 21/24   DM 9/15      Core Components/Risk Factors/Patient Goals at Admission:     Personal Goals and Risk Factors at Admission - 03/07/17 1648      Core Components/Risk Factors/Patient Goals on Admission   Diabetes Yes   Intervention Provide education about signs/symptoms and action to take for hypo/hyperglycemia.;Provide education about proper nutrition, including hydration, and aerobic/resistive exercise prescription along with prescribed medications to achieve blood glucose in normal ranges: Fasting glucose 65-99 mg/dL   Expected Outcomes Short Term: Participant verbalizes understanding of the signs/symptoms and immediate care of hyper/hypoglycemia, proper foot care and importance of medication, aerobic/resistive exercise and nutrition plan for blood glucose control.;Long Term: Attainment of HbA1C < 7%.   Hypertension Yes   Intervention Provide education on lifestyle modifcations including regular physical activity/exercise, weight  management, moderate sodium restriction and increased consumption of fresh fruit, vegetables, and low fat dairy, alcohol moderation, and smoking cessation.;Monitor prescription use compliance.   Expected Outcomes Short Term: Continued assessment and intervention until BP is < 140/37mm HG in hypertensive participants. < 130/62mm HG in hypertensive participants with diabetes, heart failure or chronic kidney disease.;Long Term: Maintenance of blood pressure at goal levels.   Stress Yes   Intervention Offer individual and/or small group education and counseling on adjustment to heart disease, stress management and health-related lifestyle change. Teach and support self-help strategies.;Refer participants experiencing significant psychosocial distress to appropriate mental health specialists for further evaluation and treatment. When possible, include family members and significant others in education/counseling sessions.   Expected Outcomes Short Term: Participant demonstrates changes in health-related behavior, relaxation and other stress management skills, ability to obtain effective social support, and compliance with psychotropic medications if prescribed.;Long Term: Emotional wellbeing is indicated by absence of clinically significant psychosocial distress or social isolation.      Core Components/Risk Factors/Patient Goals Review:    Core Components/Risk Factors/Patient Goals at Discharge (Final Review):    ITP Comments:     ITP Comments    Row Name 03/07/17 1401           ITP Comments Dr. Fransico Him, Medical Director          Comments: Rush Landmark is making expected progress toward personal goals after completing 5 sessions. Recommend continued exercise and life style modification education including  stress management and relaxation techniques to decrease cardiac risk profile. Rush Landmark is off to a good start to exercise.Barnet Pall, RN,BSN 03/30/2017 11:28 AM

## 2017-03-31 ENCOUNTER — Encounter (HOSPITAL_COMMUNITY): Payer: BLUE CROSS/BLUE SHIELD

## 2017-04-03 ENCOUNTER — Encounter (HOSPITAL_COMMUNITY)
Admission: RE | Admit: 2017-04-03 | Discharge: 2017-04-03 | Disposition: A | Discharge status: Home / Self Care | Payer: BLUE CROSS/BLUE SHIELD | Source: Ambulatory Visit | Attending: Internal Medicine | Admitting: Internal Medicine

## 2017-04-03 DIAGNOSIS — I5022 Chronic systolic (congestive) heart failure: Secondary | ICD-10-CM

## 2017-04-03 NOTE — Progress Notes (Signed)
Incomplete Session Note  Patient Details  Name: Ricardo [name] MRN: [id number] Date of Birth: 1935/11/02 Referring Provider:     CARDIAC REHAB PHASE II EXERCISE from 03/27/2017 in [place]  Referring Provider  (P) Bensimhon, Quillian Quince MD      Odette Horns did not complete his rehab session.  Bill heart rate was noted in at 110-155, chronic atrial fibrillation.Bill told me that he took his morning medications today at 12:30 including his Toprol. Blood pressure 120/70. Oxygen saturation 99% on room air. Bill told me took his daily medications yesterday at around 5 pm. I Advised Mr Holtsclaw that he not exercise today and that he take his medications on time. Bill states understanding.  Bill plans to return to exercise on Wednesday. The heart failure clinic called and notified. I spoke with Oceanographer.Barnet Pall, RN,BSN 04/03/2017 3:06 PM

## 2017-04-05 ENCOUNTER — Encounter (HOSPITAL_COMMUNITY)
Admission: RE | Admit: 2017-04-05 | Discharge: 2017-04-05 | Disposition: A | Discharge status: Home / Self Care | Payer: BLUE CROSS/BLUE SHIELD | Source: Ambulatory Visit | Attending: Internal Medicine | Admitting: Internal Medicine

## 2017-04-05 ENCOUNTER — Other Ambulatory Visit (HOSPITAL_COMMUNITY): Payer: Self-pay | Admitting: Internal Medicine

## 2017-04-05 DIAGNOSIS — I5022 Chronic systolic (congestive) heart failure: Secondary | ICD-10-CM

## 2017-04-05 NOTE — Progress Notes (Signed)
Incomplete Session Note  Patient Details  Name: Ricardo [name] MRN: [id number] Date of Birth: 1935/09/04 Referring Provider:     CARDIAC REHAB PHASE II EXERCISE from 03/27/2017 in [place]  Referring Provider  (P) Bensimhon, Quillian Quince MD      Odette Horns did not complete his rehab session.  [name]'s heart rate was noted at  105-118 Atrial fibrillation , (chronic). Oxygen saturation 99% on room air. Blood pressure 122/82. Ricardo [name] says he stopped taking his digoxin  A few weeks ago. Megan RN at the heart failure clinic called and notified. Today;s ECG tracings faxed to Dr Bensimhon's office for review. [name] is asymptomatic. Dr Claris Gladden reviewed today's ECG tracings. The office will contact Mr Pound with further instructions. No complaints upon exit from cardiac rehab.Barnet Pall, RN,BSN 04/05/2017 4:49 PM

## 2017-04-06 ENCOUNTER — Telehealth (HOSPITAL_COMMUNITY): Payer: Self-pay | Admitting: *Deleted

## 2017-04-06 MED ORDER — DIGOXIN 62.5 MCG PO TABS: 0.0625 mg | ORAL_TABLET | Freq: Every day | ORAL | 3 refills | Status: DC

## 2017-04-06 NOTE — Telephone Encounter (Signed)
Ricardo [name] with [facility] called yesterday and sent report showing patient's HR at rest was 110-118 asymptomatic.  Digoxin was stopped on July 12th due to dig level was elevated. I spoke with Barrington Ellison, PA and advises patient to restart at a lower dose 0.0625 mg Once Daily and to come back for dig trough in 10 days.  I called and spoke with Ricardo [name] and she is agreeable with plan but asked if we can check dig trough at his next scheduled appointment on August 29th and if we need to schedule it again after that then we will.  I have updated appointment notes and sent new prescription to pharmacy.

## 2017-04-07 ENCOUNTER — Encounter (HOSPITAL_COMMUNITY): Payer: BLUE CROSS/BLUE SHIELD

## 2017-04-10 ENCOUNTER — Encounter (HOSPITAL_COMMUNITY): Payer: BLUE CROSS/BLUE SHIELD

## 2017-04-11 ENCOUNTER — Telehealth (HOSPITAL_COMMUNITY): Payer: Self-pay | Admitting: *Deleted

## 2017-04-12 ENCOUNTER — Telehealth (HOSPITAL_COMMUNITY): Payer: Self-pay | Admitting: *Deleted

## 2017-04-12 ENCOUNTER — Encounter (HOSPITAL_COMMUNITY): Payer: Self-pay | Admitting: Internal Medicine

## 2017-04-12 ENCOUNTER — Ambulatory Visit (HOSPITAL_COMMUNITY)
Admission: RE | Admit: 2017-04-12 | Discharge: 2017-04-12 | Disposition: A | Discharge status: Home / Self Care | Payer: BLUE CROSS/BLUE SHIELD | Source: Ambulatory Visit | Attending: Internal Medicine | Admitting: Internal Medicine

## 2017-04-12 ENCOUNTER — Encounter (HOSPITAL_COMMUNITY): Admission: RE | Admit: 2017-04-12 | Payer: BLUE CROSS/BLUE SHIELD | Source: Ambulatory Visit

## 2017-04-12 VITALS — BP 130/86 | HR 55 | Wt 157.2 lb

## 2017-04-12 DIAGNOSIS — Z9889 Other specified postprocedural states: Secondary | ICD-10-CM | POA: Insufficient documentation

## 2017-04-12 DIAGNOSIS — I110 Hypertensive heart disease with heart failure: Secondary | ICD-10-CM | POA: Insufficient documentation

## 2017-04-12 DIAGNOSIS — M109 Gout, unspecified: Secondary | ICD-10-CM | POA: Insufficient documentation

## 2017-04-12 DIAGNOSIS — E119 Type 2 diabetes mellitus without complications: Secondary | ICD-10-CM | POA: Insufficient documentation

## 2017-04-12 DIAGNOSIS — Z8249 Family history of ischemic heart disease and other diseases of the circulatory system: Secondary | ICD-10-CM | POA: Diagnosis not present

## 2017-04-12 DIAGNOSIS — I350 Nonrheumatic aortic (valve) stenosis: Secondary | ICD-10-CM | POA: Diagnosis not present

## 2017-04-12 DIAGNOSIS — Z825 Family history of asthma and other chronic lower respiratory diseases: Secondary | ICD-10-CM | POA: Diagnosis not present

## 2017-04-12 DIAGNOSIS — I2510 Atherosclerotic heart disease of native coronary artery without angina pectoris: Secondary | ICD-10-CM | POA: Insufficient documentation

## 2017-04-12 DIAGNOSIS — Z823 Family history of stroke: Secondary | ICD-10-CM | POA: Diagnosis not present

## 2017-04-12 DIAGNOSIS — N400 Enlarged prostate without lower urinary tract symptoms: Secondary | ICD-10-CM | POA: Insufficient documentation

## 2017-04-12 DIAGNOSIS — Z7901 Long term (current) use of anticoagulants: Secondary | ICD-10-CM | POA: Diagnosis not present

## 2017-04-12 DIAGNOSIS — G4733 Obstructive sleep apnea (adult) (pediatric): Secondary | ICD-10-CM | POA: Insufficient documentation

## 2017-04-12 DIAGNOSIS — Z79899 Other long term (current) drug therapy: Secondary | ICD-10-CM | POA: Diagnosis not present

## 2017-04-12 DIAGNOSIS — Z87442 Personal history of urinary calculi: Secondary | ICD-10-CM | POA: Diagnosis not present

## 2017-04-12 DIAGNOSIS — I5022 Chronic systolic (congestive) heart failure: Secondary | ICD-10-CM | POA: Insufficient documentation

## 2017-04-12 DIAGNOSIS — K219 Gastro-esophageal reflux disease without esophagitis: Secondary | ICD-10-CM | POA: Diagnosis not present

## 2017-04-12 DIAGNOSIS — R0602 Shortness of breath: Secondary | ICD-10-CM | POA: Diagnosis not present

## 2017-04-12 DIAGNOSIS — I4821 Permanent atrial fibrillation: Secondary | ICD-10-CM

## 2017-04-12 DIAGNOSIS — Z888 Allergy status to other drugs, medicaments and biological substances status: Secondary | ICD-10-CM | POA: Insufficient documentation

## 2017-04-12 DIAGNOSIS — Q211 Atrial septal defect: Secondary | ICD-10-CM | POA: Insufficient documentation

## 2017-04-12 DIAGNOSIS — I482 Chronic atrial fibrillation: Secondary | ICD-10-CM | POA: Diagnosis not present

## 2017-04-12 MED ORDER — METOPROLOL SUCCINATE ER 50 MG PO TB24: 50.0000 mg | ORAL_TABLET | Freq: Every day | ORAL | 6 refills | Status: DC

## 2017-04-12 MED ORDER — DIGOXIN 125 MCG PO TABS: 0.0625 mg | ORAL_TABLET | Freq: Every day | ORAL | 3 refills | Status: DC

## 2017-04-12 NOTE — Progress Notes (Signed)
ADVANCED HF CLINIC  NOTE  Referring Physician: Bonner Puna Primary Care: [facility] Cardiologist: Ola Spurr  HPI:  Ricardo [name] is a 81 y.o. male  with h/o HTN, diabetes chronic atrial fibrillation and a patent foramen ovale  Has had AF for years. Followed by Dr. Ola Spurr at [facility]. In reviewing records in [place] has also had an AFL ablation. Echo in . 12/13 EF 40-45%. Does not recall having a cath in past. Denies h/o of HF. Had sleep study in past and placed on CPAP. He was subsequently told OSA was mild and CPAP stopped.   In January began to develop HF symptoms with SOB, fatigue, LE edema, orthopnea and PND. Says in December was riding stationary bike 3 miles per day without problem. Had URI at end of December and got worse. Saw Dr. Ola Spurr in January. Started lasix 20mg  PRN and ordered echo and Holter monitor due to elevated AF rate.   Admitted to [facility] in 3/18 with worseningg HF. BNP 957. AF rate elevated. Echo 40-45% with moderate RV dysfunction. Mild AS. Diuresed 9 pounds and placed on metoprolol and digoxin for HR control. Weight on d/c 145. Sent home on lasix 40 daily.   Today he returns for HF follow up. Last visit entresto was started. Digoxin was also stopped due to elevated level. Last week cardiac rehab was held due to elevated heart rate and glucose < 100. Says he was asymptomatic. Overall feeling ok. Denies SOB/PND/Orthopnea. No chest pain. Appetite ok. No fever or chills. Riding stationary bike. Weight at home 150 pounds.  Taking all medications. No palpitations or presyncope.   Echo 11/06/16 LVEF 40%, Mild/Mod MR, Severe LAE, Severe RAE, + Patent foramen ovale  Cath 11/15/16 Very heavily calcified coronary arteries with high grade lesion in moderate-sized OM1 and tandem 70% and 80% lesions in mid LAD. Distal PDA 90%. Treated medically.   RA =  6 RV = 48/6 PA = 48/19 (29) PCW = 17 Ao = 135/89 (108) LV = 147/14 Fick cardiac output/index = 3.8/2.2 PVR =  3.2 SVR = 2131 FA sat = 98% PA sat = 67%. 69%  Echo 12/13 EF 40-45%   Past Medical History:  Diagnosis Date  . Atrial fibrillation (HCC)    on coumadin.  cardiologist is in [facility] dr [name].  S/P multiple DCCVs and prior ablation.   Marland Kitchen BPH (benign prostatic hyperplasia)    by ST in 2000.   Marland Kitchen CHF (congestive heart failure) ([place])   . Complication of anesthesia    " sometimes slow to wake up"  . Diabetes mellitus without complication ([place])   . Diverticulitis    question if he ever had imaging confirmed diverticulitis  . GERD (gastroesophageal reflux disease)   . GI bleed   . Gout   . Hypertension   . Kidney stones    treated with lithotripsy   . Pneumonia   . PONV (postoperative nausea and vomiting)   . Rupture of biceps tendon 2006   left distal bicep tendon  . UTI (urinary tract infection)     Current Outpatient Prescriptions  Medication Sig Dispense Refill  . apixaban (ELIQUIS) 2.5 MG TABS tablet Take 1 tablet (2.5 mg total) by mouth 2 (two) times daily. 60 tablet 3  . b complex vitamins tablet Take 1 tablet by mouth daily.    . Cholecalciferol (VITAMIN D3) 2000 units TABS Take 2,000 Units by mouth daily.    . Cinnamon 500 MG capsule Take 1,000 mg by mouth  daily.    . Coenzyme Q10 (CO Q10) 200 MG CAPS Take 200 mcg by mouth daily.    . cyanocobalamin 100 MCG tablet Take 1,000 mcg by mouth daily.    Marland Kitchen dexlansoprazole (DEXILANT) 60 MG capsule Take 60 mg by mouth daily.    . digoxin (DIGOX) 0.125 MG tablet Take 0.5 tablets (0.0625 mg total) by mouth daily. 15 tablet 3  . docusate sodium (COLACE) 100 MG capsule Take 100 mg by mouth daily as needed for mild constipation.    . empagliflozin (JARDIANCE) 10 MG TABS tablet Take 10 mg by mouth daily.    . hydrocortisone (ANUSOL-HC) 25 MG suppository Place 1 suppository (25 mg total) rectally 2 (two) times daily. (Patient not taking: Reported on 03/15/2017) 12 suppository 0  . Magnesium Oxide 250 MG TABS Take 250 mg by  mouth daily.    . metoprolol succinate (TOPROL-XL) 25 MG 24 hr tablet Take 1 tablet (25 mg total) by mouth daily. 30 tablet 6  . potassium chloride SA (K-DUR,KLOR-CON) 20 MEQ tablet TAKE 1 TABLET BY MOUTH DAILY 30 tablet 3  . ranitidine (ZANTAC) 150 MG capsule Take 150 mg by mouth daily as needed for heartburn.    . sacubitril-valsartan (ENTRESTO) 24-26 MG Take 1 tablet by mouth 2 (two) times daily. 60 tablet 3  . spironolactone (ALDACTONE) 25 MG tablet TAKE 1 TABLET(25 MG) BY MOUTH DAILY 30 tablet 3  . torsemide (DEMADEX) 20 MG tablet Take 20 mg by mouth daily.  6  . vitamin C (ASCORBIC ACID) 250 MG tablet Take 250 mg by mouth daily.     No current facility-administered medications for this encounter.    Allergies  Allergen Reactions  . Codeine Other (See Comments)    "Gives me headaches"  . Tape     Adhesive tape caused bruising; use paper tape only.   Social History   Social History  . Marital status: Married    Spouse name: N/A  . Number of children: N/A  . Years of education: N/A   Occupational History  . Not on file.   Social History Main Topics  . Smoking status: Never Smoker  . Smokeless tobacco: Never Used  . Alcohol use Yes     Comment: rare glass of wine  . Drug use: No  . Sexual activity: Not on file   Other Topics Concern  . Not on file   Social History Narrative  . No narrative on file     Family History  Problem Relation Age of Onset  . Tuberculosis Mother   . Stroke Father   . Congestive Heart Failure Father     Vitals:   04/12/17 1505  BP: 130/86  Pulse: (!) 55  SpO2: 95%  Weight: 157 lb 4 oz (71.3 kg)   Wt Readings from Last 3 Encounters:  04/12/17 157 lb 4 oz (71.3 kg)  03/07/17 148 lb 2.4 oz (67.2 kg)  02/07/17 143 lb 12 oz (65.2 kg)    PHYSICAL EXAM: General:  Elderly. NAD. No resp difficulty HEENT: normal Neck: supple. no JVD. Carotids 2+ bilat; no bruits. No lymphadenopathy or thryomegaly appreciated. Cor: PMI nondisplaced.  Irregular rate & rhythm. Tachy 2/6 AS   Lungs: clear Abdomen: soft, nontender, nondistended. No hepatosplenomegaly. No bruits or masses. Good bowel sounds. Extremities: no cyanosis, clubbing, rash, edema Neuro: alert & orientedx3, cranial nerves grossly intact. moves all 4 extremities w/o difficulty. Affect pleasant  ECG AF 111 with PVCs RBBB   ASSESSMENT & PLAN: 1.  Chronic systolic HF --Echo 4/92/01 EF 40-45% with moderate RV dysfunction. Seems out of proportion to CAD. 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 Etiology unclear. ? HTN, OSA or infiltrative process NYHA II. Volume status stable. Continue torsemide and spiro at current dose.  - Back on low dose digoxin 0.0625 mg daily.    - Continue entresto to 49-51 mg twice a day.   - Will get cMRI to further evaluate for infiltrative CM. - SPEP negative 2. Chronic AF  --Now with RVR. Restart digoxin at 0.0625 daily and increaet Torpol to 50 daily. Place monitor to follow rates. --CHADSVASC = 5 (age, DM, HTN, HF) --Continue Eliquis. No further bleeding.   3. Mild aortic stenosis - Moderately calcified on Echo 11/06/16. - Mean gradient 9 mm Hg, Peak gradient 15 mm Hg 4. HTN  - Meds as above.  5. DM2 - Per PCP 6. Suspected sleep apnea.  Sleep study was negative.   7. H/o LGIB - Follows with Dr. Collene Mares. No clear reason for colonoscopy unless he rebleeds.  8. CAD -He has highly calcified coronary arteries with borderline lesion in midLAD. Currently no ischemic sx. Dr. Haroldine Laws has discussed personally with Dr. Burt Knack who agreed with plan for medical therapy. No change.    Darrick Grinder, NP  3:06 PM   Patient seen and examined with Darrick Grinder, NP. We discussed all aspects of the encounter. I agree with the assessment and plan as stated above.   His rate remains elevated in AF (chronic). Will increase Toprol and add back digoxin. Place monotor to follow rates. Will likely need further titration of Toprol. Continue Eliquis.   HF and CAD are stable.  Volume status looks good.   Glori Bickers, MD  11:37 PM

## 2017-04-12 NOTE — Telephone Encounter (Signed)
Received surgical clearance to hold eliquis for tooth extraction from [facility] [employer] office.  Per Dr. Haroldine Laws patient may hold Eliquis for 24 hours prior to procedure and restart the night of the procedure.  Faxed to [phone].

## 2017-04-12 NOTE — Patient Instructions (Signed)
Continue Digoxin 0.0625 mg (1/2 tab) daily  Increase Metoprolol to 50 mg daily  Your physician has requested that you have a cardiac MRI. Cardiac MRI uses a computer to create images of your heart as its beating, producing both still and moving pictures of your heart and major blood vessels. For further information please visit [email or web address] Please follow the instruction sheet given to you today for more information.  Your physician recommends that you schedule a follow-up appointment in: 3 months

## 2017-04-13 ENCOUNTER — Other Ambulatory Visit (HOSPITAL_COMMUNITY): Payer: Self-pay | Admitting: Internal Medicine

## 2017-04-13 DIAGNOSIS — R000 Tachycardia, unspecified: Secondary | ICD-10-CM

## 2017-04-13 DIAGNOSIS — I4821 Permanent atrial fibrillation: Secondary | ICD-10-CM

## 2017-04-14 ENCOUNTER — Encounter (HOSPITAL_COMMUNITY): Admission: RE | Admit: 2017-04-14 | Payer: BLUE CROSS/BLUE SHIELD | Source: Ambulatory Visit

## 2017-04-18 ENCOUNTER — Ambulatory Visit (INDEPENDENT_AMBULATORY_CARE_PROVIDER_SITE_OTHER): Payer: BLUE CROSS/BLUE SHIELD

## 2017-04-18 DIAGNOSIS — R000 Tachycardia, unspecified: Secondary | ICD-10-CM | POA: Diagnosis not present

## 2017-04-18 DIAGNOSIS — I482 Chronic atrial fibrillation: Secondary | ICD-10-CM

## 2017-04-18 DIAGNOSIS — I4821 Permanent atrial fibrillation: Secondary | ICD-10-CM

## 2017-04-19 ENCOUNTER — Encounter (HOSPITAL_COMMUNITY): Payer: BLUE CROSS/BLUE SHIELD

## 2017-04-21 ENCOUNTER — Encounter (HOSPITAL_COMMUNITY): Payer: BLUE CROSS/BLUE SHIELD

## 2017-04-24 ENCOUNTER — Encounter (HOSPITAL_COMMUNITY): Payer: BLUE CROSS/BLUE SHIELD

## 2017-04-25 NOTE — Addendum Note (Signed)
Encounter addended by: Sol Passer on: 04/25/2017  7:39 AM<BR>    Actions taken: Visit Navigator Flowsheet section accepted, Flowsheet data copied forward, Flowsheet accepted

## 2017-04-26 ENCOUNTER — Encounter (HOSPITAL_COMMUNITY)
Admission: RE | Admit: 2017-04-26 | Discharge: 2017-04-26 | Disposition: A | Discharge status: Home / Self Care | Payer: BLUE CROSS/BLUE SHIELD | Source: Ambulatory Visit | Attending: Internal Medicine | Admitting: Internal Medicine

## 2017-04-26 DIAGNOSIS — E119 Type 2 diabetes mellitus without complications: Secondary | ICD-10-CM | POA: Insufficient documentation

## 2017-04-26 DIAGNOSIS — I5022 Chronic systolic (congestive) heart failure: Secondary | ICD-10-CM | POA: Insufficient documentation

## 2017-04-26 DIAGNOSIS — I4891 Unspecified atrial fibrillation: Secondary | ICD-10-CM | POA: Insufficient documentation

## 2017-04-26 DIAGNOSIS — Z79899 Other long term (current) drug therapy: Secondary | ICD-10-CM | POA: Insufficient documentation

## 2017-04-26 DIAGNOSIS — K219 Gastro-esophageal reflux disease without esophagitis: Secondary | ICD-10-CM | POA: Insufficient documentation

## 2017-04-26 DIAGNOSIS — Z7901 Long term (current) use of anticoagulants: Secondary | ICD-10-CM | POA: Insufficient documentation

## 2017-04-26 DIAGNOSIS — I110 Hypertensive heart disease with heart failure: Secondary | ICD-10-CM | POA: Insufficient documentation

## 2017-04-26 NOTE — Progress Notes (Signed)
Cardiac Individual Treatment Plan  Patient Details  Name: Ricardo [name] MRN: [id number] Date of Birth: 1935/09/04 Referring Provider:     CARDIAC REHAB PHASE II EXERCISE from 03/27/2017 in [facility]  Referring Provider  (P) Glori Bickers MD      Initial Encounter Date:    CARDIAC REHAB PHASE II EXERCISE from 03/27/2017 in [facility]  Date  (P) 03/07/17  Referring Provider  (P) Haroldine Laws, Daniel MD      Visit Diagnosis: Chronic systolic congestive heart failure ([facility])  Patient's Home Medications on Admission:  Current Outpatient Prescriptions:  .  apixaban (ELIQUIS) 2.5 MG TABS tablet, Take 1 tablet (2.5 mg total) by mouth 2 (two) times daily., Disp: 60 tablet, Rfl: 3 .  b complex vitamins tablet, Take 1 tablet by mouth daily., Disp: , Rfl:  .  Cholecalciferol (VITAMIN D3) 2000 units TABS, Take 2,000 Units by mouth daily., Disp: , Rfl:  .  Cinnamon 500 MG capsule, Take 1,000 mg by mouth daily., Disp: , Rfl:  .  Coenzyme Q10 (CO Q10) 200 MG CAPS, Take 200 mcg by mouth daily., Disp: , Rfl:  .  cyanocobalamin 100 MCG tablet, Take 1,000 mcg by mouth daily., Disp: , Rfl:  .  dexlansoprazole (DEXILANT) 60 MG capsule, Take 60 mg by mouth daily., Disp: , Rfl:  .  digoxin (LANOXIN) 0.125 MG tablet, Take 0.5 tablets (0.0625 mg total) by mouth daily., Disp: 90 tablet, Rfl: 3 .  docusate sodium (COLACE) 100 MG capsule, Take 100 mg by mouth daily as needed for mild constipation., Disp: , Rfl:  .  empagliflozin (JARDIANCE) 10 MG TABS tablet, Take 10 mg by mouth daily., Disp: , Rfl:  .  hydrocortisone (ANUSOL-HC) 25 MG suppository, Place 1 suppository (25 mg total) rectally 2 (two) times daily., Disp: 12 suppository, Rfl: 0 .  Magnesium Oxide 250 MG TABS, Take 250 mg by mouth daily., Disp: , Rfl:  .  metoprolol succinate (TOPROL-XL) 50 MG 24 hr tablet, Take 1 tablet (50 mg total) by mouth daily., Disp: 30 tablet, Rfl: 6 .  potassium  chloride SA (K-DUR,KLOR-CON) 20 MEQ tablet, TAKE 1 TABLET BY MOUTH DAILY, Disp: 30 tablet, Rfl: 3 .  ranitidine (ZANTAC) 150 MG capsule, Take 150 mg by mouth daily as needed for heartburn., Disp: , Rfl:  .  sacubitril-valsartan (ENTRESTO) 24-26 MG, Take 1 tablet by mouth 2 (two) times daily., Disp: 60 tablet, Rfl: 3 .  spironolactone (ALDACTONE) 25 MG tablet, TAKE 1 TABLET(25 MG) BY MOUTH DAILY, Disp: 30 tablet, Rfl: 3 .  torsemide (DEMADEX) 20 MG tablet, Take 10 mg by mouth daily., Disp: , Rfl:  .  vitamin C (ASCORBIC ACID) 250 MG tablet, Take 250 mg by mouth daily., Disp: , Rfl:   Past Medical History: Past Medical History:  Diagnosis Date  . Atrial fibrillation (HCC)    on coumadin.  cardiologist is in [place] dr [name].  S/P multiple DCCVs and prior ablation.   Marland Kitchen BPH (benign prostatic hyperplasia)    by ST in 2000.   Marland Kitchen CHF (congestive heart failure) ([place])   . Complication of anesthesia    " sometimes slow to wake up"  . Diabetes mellitus without complication ([place])   . Diverticulitis    question if he ever had imaging confirmed diverticulitis  . GERD (gastroesophageal reflux disease)   . GI bleed   . Gout   . Hypertension   . Kidney stones    treated  with lithotripsy   . Pneumonia   . PONV (postoperative nausea and vomiting)   . Rupture of biceps tendon 2006   left distal bicep tendon  . UTI (urinary tract infection)     Tobacco Use: History  Smoking Status  . Never Smoker  Smokeless Tobacco  . Never Used    Labs: Recent Review Flowsheet Data    Labs for ITP Cardiac and Pulmonary Rehab Latest Ref Rng & Units 11/15/2016 11/15/2016 11/15/2016 11/19/2016   Hemoglobin A1c 4.8 - 5.6 % - - - 6.7(H)   PHART 7.350 - 7.450 7.461(H) - - -   PCO2ART 32.0 - 48.0 mmHg 36.9 - - -   HCO3 20.0 - 28.0 mmol/L 26.3 26.8 29.4(H) -   TCO2 0 - 100 mmol/L 27 28 31  -   O2SAT % 98.0 68.0 70.0 -      Capillary Blood Glucose: Lab Results  Component Value Date   GLUCAP 125 (H)  03/29/2017   GLUCAP 93 03/27/2017   GLUCAP 112 (H) 03/17/2017   GLUCAP 102 (H) 03/15/2017   GLUCAP 90 03/15/2017     Exercise Target Goals:    Exercise Program Goal: Individual exercise prescription set with THRR, safety & activity barriers. Participant demonstrates ability to understand and report RPE using BORG scale, to self-measure pulse accurately, and to acknowledge the importance of the exercise prescription.  Exercise Prescription Goal: Starting with aerobic activity 30 plus minutes a day, 3 days per week for initial exercise prescription. Provide home exercise prescription and guidelines that participant acknowledges understanding prior to discharge.  Activity Barriers & Risk Stratification:     Activity Barriers & Cardiac Risk Stratification - 03/07/17 1644      Activity Barriers & Cardiac Risk Stratification   Activity Barriers Deconditioning;Muscular Weakness   Cardiac Risk Stratification High      6 Minute Walk:     6 Minute Walk    Row Name 03/07/17 1640         6 Minute Walk   Phase Initial     Distance 1037 feet     Walk Time 6 minutes     # of Rest Breaks 0     MPH 1.96     METS 2.13     RPE 9     VO2 Peak 7.47     Symptoms No     Resting HR 81 bpm     Resting BP 118/74     Max Ex. HR 109 bpm     Max Ex. BP 120/84     2 Minute Post BP 125/88        Oxygen Initial Assessment:   Oxygen Re-Evaluation:   Oxygen Discharge (Final Oxygen Re-Evaluation):   Initial Exercise Prescription:     Initial Exercise Prescription - 03/24/17 1400      Date of Initial Exercise RX and Referring Provider   Date (P)  03/07/17   Referring Provider (P)  Haroldine Laws, Daniel MD     Treadmill   MPH 1.8   Grade 0   Minutes 10   METs 2.38     Recumbant Bike   Level 2   Minutes 10     NuStep   Level 2   SPM 80   Minutes 10     Prescription Details   Frequency (times per week) (P)  3     Intensity   THRR 40-80% of Max Heartrate (P)  56-113    Ratings of Perceived Exertion (P)  11-15  Perceived Dyspnea (P)  0-4     Progression   Progression (P)  Continue to progress workloads to maintain intensity without signs/symptoms of physical distress.     Resistance Training   Training Prescription Yes   Weight 2lbs   Reps 10-15      Perform Capillary Blood Glucose checks as needed.  Exercise Prescription Changes:     Exercise Prescription Changes    Row Name 03/24/17 1400 04/25/17 0700           Response to Exercise   Blood Pressure (Admit) 134/84 112/82      Blood Pressure (Exercise) 148/84 118/70      Blood Pressure (Exit) 100/72 118/70      Heart Rate (Admit) 81 bpm 82 bpm      Heart Rate (Exercise) 108 bpm 102 bpm      Heart Rate (Exit) 76 bpm 80 bpm      Rating of Perceived Exertion (Exercise) 12 11      Symptoms  - Afib  Last attended on 04/05/17 but unable to exercise due to afib.      Duration Progress to 45 minutes of aerobic exercise without signs/symptoms of physical distress Progress to 45 minutes of aerobic exercise without signs/symptoms of physical distress      Intensity THRR unchanged THRR unchanged        Progression   Progression Continue to progress workloads to maintain intensity without signs/symptoms of physical distress. Continue to progress workloads to maintain intensity without signs/symptoms of physical distress.      Average METs 2.2 2.2        Resistance Training   Training Prescription  - Yes      Weight  - 5lbs      Reps  - 10-15        Interval Training   Interval Training  - No        Treadmill   MPH  - 1.8      Grade  - 0      Minutes  - 10      METs  - 2.38        Recumbant Bike   Level  - 2      Minutes  - 10      METs 2 2        NuStep   Level  - 2      SPM  - 80      Minutes  - 10      METs 2 2.1         Exercise Comments:     Exercise Comments    Row Name 03/24/17 1618 04/25/17 0735         Exercise Comments Pt is off to a great start with exercise!  Pt out since 04/05/17 because of afib.         Exercise Goals and Review:     Exercise Goals    Row Name 03/07/17 1644             Exercise Goals   Increase Physical Activity Yes       Intervention Provide advice, education, support and counseling about physical activity/exercise needs.;Develop an individualized exercise prescription for aerobic and resistive training based on initial evaluation findings, risk stratification, comorbidities and participant's personal goals.       Expected Outcomes Achievement of increased cardiorespiratory fitness and enhanced flexibility, muscular endurance and strength shown through measurements of functional capacity and personal statement of participant.  Increase Strength and Stamina Yes  improve blood flow to heart, build on exercise tolerance. Increase walking distance       Intervention Provide advice, education, support and counseling about physical activity/exercise needs.;Develop an individualized exercise prescription for aerobic and resistive training based on initial evaluation findings, risk stratification, comorbidities and participant's personal goals.       Expected Outcomes Achievement of increased cardiorespiratory fitness and enhanced flexibility, muscular endurance and strength shown through measurements of functional capacity and personal statement of participant.          Exercise Goals Re-Evaluation :     Exercise Goals Re-Evaluation    Row Name 04/25/17 0736             Exercise Goal Re-Evaluation   Comments Pt has been out from [facility] since 04/05/17 because of afib. Unable to reevaluate goals at this time due to abscence.           Discharge Exercise Prescription (Final Exercise Prescription Changes):     Exercise Prescription Changes - 04/25/17 0700      Response to Exercise   Blood Pressure (Admit) 112/82   Blood Pressure (Exercise) 118/70   Blood Pressure (Exit) 118/70   Heart Rate (Admit) 82 bpm   Heart  Rate (Exercise) 102 bpm   Heart Rate (Exit) 80 bpm   Rating of Perceived Exertion (Exercise) 11   Symptoms Afib  Last attended on 04/05/17 but unable to exercise due to afib.   Duration Progress to 45 minutes of aerobic exercise without signs/symptoms of physical distress   Intensity THRR unchanged     Progression   Progression Continue to progress workloads to maintain intensity without signs/symptoms of physical distress.   Average METs 2.2     Resistance Training   Training Prescription Yes   Weight 5lbs   Reps 10-15     Interval Training   Interval Training No     Treadmill   MPH 1.8   Grade 0   Minutes 10   METs 2.38     Recumbant Bike   Level 2   Minutes 10   METs 2     NuStep   Level 2   SPM 80   Minutes 10   METs 2.1      Nutrition:  Target Goals: Understanding of nutrition guidelines, daily intake of sodium 1500mg , cholesterol 200mg , calories 30% from fat and 7% or less from saturated fats, daily to have 5 or more servings of fruits and vegetables.  Biometrics:     Pre Biometrics - 03/07/17 1645      Pre Biometrics   Waist Circumference 35.25 inches   Hip Circumference 37.5 inches   Waist to Hip Ratio 0.94 %   Triceps Skinfold 19 mm   % Body Fat 25.5 %   Grip Strength 37 kg   Flexibility 8 in       Nutrition Therapy Plan and Nutrition Goals:     Nutrition Therapy & Goals - 03/15/17 0938      Nutrition Therapy   Diet Therapeutic Lifestyle Changes     Personal Nutrition Goals   Nutrition Goal Pt to describe the benefit of including fruits, vegetables, whole grains, and low-fat dairy products in a heart healthy meal plan.     Intervention Plan   Intervention Prescribe, educate and counsel regarding individualized specific dietary modifications aiming towards targeted core components such as weight, hypertension, lipid management, diabetes, heart failure and other comorbidities.   Expected Outcomes Short Term  Goal: Understand basic  principles of dietary content, such as calories, fat, sodium, cholesterol and nutrients.;Long Term Goal: Adherence to prescribed nutrition plan.      Nutrition Discharge: Nutrition Scores:     Nutrition Assessments - 03/15/17 0952      MEDFICTS Scores   Pre Score 17      Nutrition Goals Re-Evaluation:   Nutrition Goals Re-Evaluation:   Nutrition Goals Discharge (Final Nutrition Goals Re-Evaluation):   Psychosocial: Target Goals: Acknowledge presence or absence of significant depression and/or stress, maximize coping skills, provide positive support system. Participant is able to verbalize types and ability to use techniques and skills needed for reducing stress and depression.  Initial Review & Psychosocial Screening:     Initial Psych Review & Screening - 03/07/17 1556      Initial Review   Current issues with None Identified     Family Dynamics   Good Support System? Yes  wife    Comments upon brief assessment, no psychosocial needs identified, no interventions necessary      Barriers   Psychosocial barriers to participate in program There are no identifiable barriers or psychosocial needs.     Screening Interventions   Interventions Encouraged to exercise;Provide feedback about the scores to participant      Quality of Life Scores:     Quality of Life - 03/07/17 1555      Quality of Life Scores   Health/Function Pre 28.4 %   Socioeconomic Pre 28.4 %   Psych/Spiritual Pre 28.7 %   Family Pre 30 %   GLOBAL Pre 28.7 %      PHQ-9: Recent Review Flowsheet Data    Depression screen [facility] 2/9 03/15/2017   Decreased Interest 0   Down, Depressed, Hopeless 0   PHQ - 2 Score 0     Interpretation of Total Score  Total Score Depression Severity:  1-4 = Minimal depression, 5-9 = Mild depression, 10-14 = Moderate depression, 15-19 = Moderately severe depression, 20-27 = Severe depression   Psychosocial Evaluation and Intervention:   Psychosocial  Re-Evaluation:     Psychosocial Re-Evaluation    [place] Name 03/30/17 1125             Psychosocial Re-Evaluation   Current issues with None Identified       Interventions Encouraged to attend Cardiac Rehabilitation for the exercise       Continue Psychosocial Services  No Follow up required          Psychosocial Discharge (Final Psychosocial Re-Evaluation):     Psychosocial Re-Evaluation - 03/30/17 1125      Psychosocial Re-Evaluation   Current issues with None Identified   Interventions Encouraged to attend Cardiac Rehabilitation for the exercise   Continue Psychosocial Services  No Follow up required      Vocational Rehabilitation: Provide vocational rehab assistance to qualifying candidates.   Vocational Rehab Evaluation & Intervention:     Vocational Rehab - 03/07/17 1658      Initial Vocational Rehab Evaluation & Intervention   Assessment shows need for Vocational Rehabilitation No      Education: Education Goals: Education classes will be provided on a weekly basis, covering required topics. Participant will state understanding/return demonstration of topics presented.  Learning Barriers/Preferences:     Learning Barriers/Preferences - 03/07/17 1423      Learning Barriers/Preferences   Learning Barriers Sight   Learning Preferences Skilled Demonstration;Verbal Instruction;Video      Education Topics: Count Your Pulse:  -Group instruction provided by  verbal instruction, demonstration, patient participation and written materials to support subject.  Instructors address importance of being able to find your pulse and how to count your pulse when at home without a heart monitor.  Patients get hands on experience counting their pulse with staff help and individually.   Heart Attack, Angina, and Risk Factor Modification:  -Group instruction provided by verbal instruction, video, and written materials to support subject.  Instructors address signs and  symptoms of angina and heart attacks.    Also discuss risk factors for heart disease and how to make changes to improve heart health risk factors.   Functional Fitness:  -Group instruction provided by verbal instruction, demonstration, patient participation, and written materials to support subject.  Instructors address safety measures for doing things around the house.  Discuss how to get up and down off the floor, how to pick things up properly, how to safely get out of a chair without assistance, and balance training.   Meditation and Mindfulness:  -Group instruction provided by verbal instruction, patient participation, and written materials to support subject.  Instructor addresses importance of mindfulness and meditation practice to help reduce stress and improve awareness.  Instructor also leads participants through a meditation exercise.    CARDIAC REHAB PHASE II EXERCISE from 03/29/2017 in [facility]  Date  03/29/17  Instruction Review Code  2- meets goals/outcomes      Stretching for Flexibility and Mobility:  -Group instruction provided by verbal instruction, patient participation, and written materials to support subject.  Instructors lead participants through series of stretches that are designed to increase flexibility thus improving mobility.  These stretches are additional exercise for major muscle groups that are typically performed during regular warm up and cool down.   Hands Only CPR:  -Group verbal, video, and participation provides a basic overview of AHA guidelines for community CPR. Role-play of emergencies allow participants the opportunity to practice calling for help and chest compression technique with discussion of AED use.   Hypertension: -Group verbal and written instruction that provides a basic overview of hypertension including the most recent diagnostic guidelines, risk factor reduction with self-care instructions and  medication management.    Nutrition I class: Heart Healthy Eating:  -Group instruction provided by PowerPoint slides, verbal discussion, and written materials to support subject matter. The instructor gives an explanation and review of the Therapeutic Lifestyle Changes diet recommendations, which includes a discussion on lipid goals, dietary fat, sodium, fiber, plant stanol/sterol esters, sugar, and the components of a well-balanced, healthy diet.   Nutrition II class: Lifestyle Skills:  -Group instruction provided by PowerPoint slides, verbal discussion, and written materials to support subject matter. The instructor gives an explanation and review of label reading, grocery shopping for heart health, heart healthy recipe modifications, and ways to make healthier choices when eating out.   Diabetes Question & Answer:  -Group instruction provided by PowerPoint slides, verbal discussion, and written materials to support subject matter. The instructor gives an explanation and review of diabetes co-morbidities, pre- and post-prandial blood glucose goals, pre-exercise blood glucose goals, signs, symptoms, and treatment of hypoglycemia and hyperglycemia, and foot care basics.   Diabetes Blitz:  -Group instruction provided by PowerPoint slides, verbal discussion, and written materials to support subject matter. The instructor gives an explanation and review of the physiology behind type 1 and type 2 diabetes, diabetes medications and rational behind using different medications, pre- and post-prandial blood glucose recommendations and Hemoglobin A1c goals, diabetes diet,  and exercise including blood glucose guidelines for exercising safely.    Portion Distortion:  -Group instruction provided by PowerPoint slides, verbal discussion, written materials, and food models to support subject matter. The instructor gives an explanation of serving size versus portion size, changes in portions sizes over the last  20 years, and what consists of a serving from each food group.   Stress Management:  -Group instruction provided by verbal instruction, video, and written materials to support subject matter.  Instructors review role of stress in heart disease and how to cope with stress positively.     Exercising on Your Own:  -Group instruction provided by verbal instruction, power point, and written materials to support subject.  Instructors discuss benefits of exercise, components of exercise, frequency and intensity of exercise, and end points for exercise.  Also discuss use of nitroglycerin and activating EMS.  Review options of places to exercise outside of rehab.  Review guidelines for sex with heart disease.   Cardiac Drugs I:  -Group instruction provided by verbal instruction and written materials to support subject.  Instructor reviews cardiac drug classes: antiplatelets, anticoagulants, beta blockers, and statins.  Instructor discusses reasons, side effects, and lifestyle considerations for each drug class.   Cardiac Drugs II:  -Group instruction provided by verbal instruction and written materials to support subject.  Instructor reviews cardiac drug classes: angiotensin converting enzyme inhibitors (ACE-I), angiotensin II receptor blockers (ARBs), nitrates, and calcium channel blockers.  Instructor discusses reasons, side effects, and lifestyle considerations for each drug class.   Anatomy and Physiology of the Circulatory System:  Group verbal and written instruction and models provide basic cardiac anatomy and physiology, with the coronary electrical and arterial systems. Review of: AMI, Angina, Valve disease, Heart Failure, Peripheral Artery Disease, Cardiac Arrhythmia, Pacemakers, and the ICD.   CARDIAC REHAB PHASE II EXERCISE from 03/29/2017 in [place]  Date  03/15/17  Instruction Review Code  2- meets goals/outcomes      Other Education:  -Group or  individual verbal, written, or video instructions that support the educational goals of the cardiac rehab program.   Knowledge Questionnaire Score:     Knowledge Questionnaire Score - 03/15/17 0953      Knowledge Questionnaire Score   Pre Score 21/24   DM 9/15      Core Components/Risk Factors/Patient Goals at Admission:     Personal Goals and Risk Factors at Admission - 03/07/17 1648      Core Components/Risk Factors/Patient Goals on Admission   Diabetes Yes   Intervention Provide education about signs/symptoms and action to take for hypo/hyperglycemia.;Provide education about proper nutrition, including hydration, and aerobic/resistive exercise prescription along with prescribed medications to achieve blood glucose in normal ranges: Fasting glucose 65-99 mg/dL   Expected Outcomes Short Term: Participant verbalizes understanding of the signs/symptoms and immediate care of hyper/hypoglycemia, proper foot care and importance of medication, aerobic/resistive exercise and nutrition plan for blood glucose control.;Long Term: Attainment of HbA1C < 7%.   Hypertension Yes   Intervention Provide education on lifestyle modifcations including regular physical activity/exercise, weight management, moderate sodium restriction and increased consumption of fresh fruit, vegetables, and low fat dairy, alcohol moderation, and smoking cessation.;Monitor prescription use compliance.   Expected Outcomes Short Term: Continued assessment and intervention until BP is < 140/40mm HG in hypertensive participants. < 130/95mm HG in hypertensive participants with diabetes, heart failure or chronic kidney disease.;Long Term: Maintenance of blood pressure at goal levels.   Stress Yes  Intervention Offer individual and/or small group education and counseling on adjustment to heart disease, stress management and health-related lifestyle change. Teach and support self-help strategies.;Refer participants experiencing  significant psychosocial distress to appropriate mental health specialists for further evaluation and treatment. When possible, include family members and significant others in education/counseling sessions.   Expected Outcomes Short Term: Participant demonstrates changes in health-related behavior, relaxation and other stress management skills, ability to obtain effective social support, and compliance with psychotropic medications if prescribed.;Long Term: Emotional wellbeing is indicated by absence of clinically significant psychosocial distress or social isolation.      Core Components/Risk Factors/Patient Goals Review:      Goals and Risk Factor Review    Row Name 04/26/17 1446             Core Components/Risk Factors/Patient Goals Review   Personal Goals Review Lipids;Diabetes       Review Bill's blood sugars have been stable on his days of attendance at cardiac rehab       Expected Outcomes Patient will continue to take his statin and diabetic medicaiton as presribed upon DC from cardiac rehab          Core Components/Risk Factors/Patient Goals at Discharge (Final Review):      Goals and Risk Factor Review - 04/26/17 1446      Core Components/Risk Factors/Patient Goals Review   Personal Goals Review Lipids;Diabetes   Review Bill's blood sugars have been stable on his days of attendance at cardiac rehab   Expected Outcomes Patient will continue to take his statin and diabetic medicaiton as presribed upon DC from cardiac rehab      ITP Comments:     ITP Comments    Row Name 03/07/17 1401           ITP Comments Dr. Fransico Him, Medical Director          Comments: Rush Landmark has completed 5 exercise sessions at cardiac rehab. Bill's attendance has been sporadic. Awaiting clearance from Dr Missy Sabins when the patient can return to exercise.Barnet Pall, RN,BSN 04/26/2017 4:45 PM

## 2017-04-28 ENCOUNTER — Encounter (HOSPITAL_COMMUNITY): Admission: RE | Admit: 2017-04-28 | Payer: BLUE CROSS/BLUE SHIELD | Source: Ambulatory Visit

## 2017-05-01 ENCOUNTER — Encounter (HOSPITAL_COMMUNITY): Payer: BLUE CROSS/BLUE SHIELD

## 2017-05-03 ENCOUNTER — Encounter (HOSPITAL_COMMUNITY): Payer: BLUE CROSS/BLUE SHIELD

## 2017-05-05 ENCOUNTER — Encounter (HOSPITAL_COMMUNITY): Admission: RE | Admit: 2017-05-05 | Payer: BLUE CROSS/BLUE SHIELD | Source: Ambulatory Visit

## 2017-05-07 ENCOUNTER — Other Ambulatory Visit (HOSPITAL_COMMUNITY): Payer: Self-pay | Admitting: Internal Medicine

## 2017-05-08 ENCOUNTER — Encounter (HOSPITAL_COMMUNITY): Payer: BLUE CROSS/BLUE SHIELD

## 2017-05-10 ENCOUNTER — Encounter (HOSPITAL_COMMUNITY): Payer: BLUE CROSS/BLUE SHIELD

## 2017-05-12 ENCOUNTER — Encounter (HOSPITAL_COMMUNITY): Payer: BLUE CROSS/BLUE SHIELD

## 2017-05-15 ENCOUNTER — Encounter (HOSPITAL_COMMUNITY): Payer: BLUE CROSS/BLUE SHIELD

## 2017-05-16 ENCOUNTER — Telehealth (HOSPITAL_COMMUNITY): Payer: Self-pay | Admitting: *Deleted

## 2017-05-16 NOTE — Progress Notes (Signed)
Discharge Progress Report  Patient Details  Name: Ricardo [name] MRN: [id number] Date of Birth: [birth year] Referring Provider:     CARDIAC REHAB PHASE II EXERCISE from 03/27/2017 in [facility]  Referring Provider  (P) Ricardo Laws, Daniel MD       Number of Visits: 5  Reason for Discharge:  Early Exit:  Lack of attendance  Smoking History:  History  Smoking Status  . Never Smoker  Smokeless Tobacco  . Never Used    Diagnosis:  Chronic Systolic CHF  ADL UCSD:   Initial Exercise Prescription:     Initial Exercise Prescription - 03/24/17 1400      Date of Initial Exercise RX and Referring Provider   Date (P)  03/07/17   Referring Provider (P)  Ricardo Laws, Daniel MD     Treadmill   MPH 1.8   Grade 0   Minutes 10   METs 2.38     Recumbant Bike   Level 2   Minutes 10     NuStep   Level 2   SPM 80   Minutes 10     Prescription Details   Frequency (times per week) (P)  3     Intensity   THRR 40-80% of Max Heartrate (P)  56-113   Ratings of Perceived Exertion (P)  11-15   Perceived Dyspnea (P)  0-4     Progression   Progression (P)  Continue to progress workloads to maintain intensity without signs/symptoms of physical distress.     Resistance Training   Training Prescription Yes   Weight 2lbs   Reps 10-15      Discharge Exercise Prescription (Final Exercise Prescription Changes):     Exercise Prescription Changes - 04/25/17 0700      Response to Exercise   Blood Pressure (Admit) 112/82   Blood Pressure (Exercise) 118/70   Blood Pressure (Exit) 118/70   Heart Rate (Admit) 82 bpm   Heart Rate (Exercise) 102 bpm   Heart Rate (Exit) 80 bpm   Rating of Perceived Exertion (Exercise) 11   Symptoms Afib  Last attended on 04/05/17 but unable to exercise due to afib.   Duration Progress to 45 minutes of aerobic exercise without signs/symptoms of physical distress   Intensity THRR unchanged     Progression   Progression  Continue to progress workloads to maintain intensity without signs/symptoms of physical distress.   Average METs 2.2     Resistance Training   Training Prescription Yes   Weight 5lbs   Reps 10-15     Interval Training   Interval Training No     Treadmill   MPH 1.8   Grade 0   Minutes 10   METs 2.38     Recumbant Bike   Level 2   Minutes 10   METs 2     NuStep   Level 2   SPM 80   Minutes 10   METs 2.1      Functional Capacity:     6 Minute Walk    Row Name 03/07/17 1640         6 Minute Walk   Phase Initial     Distance 1037 feet     Walk Time 6 minutes     # of Rest Breaks 0     MPH 1.96     METS 2.13     RPE 9     VO2 Peak 7.47     Symptoms No  Resting HR 81 bpm     Resting BP 118/74     Max Ex. HR 109 bpm     Max Ex. BP 120/84     2 Minute Post BP 125/88        Psychological, QOL, Others - Outcomes: PHQ 2/9: Depression screen PHQ 2/9 03/15/2017  Decreased Interest 0  Down, Depressed, Hopeless 0  PHQ - 2 Score 0    Quality of Life:     Quality of Life - 03/07/17 1555      Quality of Life Scores   Health/Function Pre 28.4 %   Socioeconomic Pre 28.4 %   Psych/Spiritual Pre 28.7 %   Family Pre 30 %   GLOBAL Pre 28.7 %      Personal Goals: Goals established at orientation with interventions provided to work toward goal.     Personal Goals and Risk Factors at Admission - 03/07/17 1648      Core Components/Risk Factors/Patient Goals on Admission   Diabetes Yes   Intervention Provide education about signs/symptoms and action to take for hypo/hyperglycemia.;Provide education about proper nutrition, including hydration, and aerobic/resistive exercise prescription along with prescribed medications to achieve blood glucose in normal ranges: Fasting glucose 65-99 mg/dL   Expected Outcomes Short Term: Participant verbalizes understanding of the signs/symptoms and immediate care of hyper/hypoglycemia, proper foot care and importance of  medication, aerobic/resistive exercise and nutrition plan for blood glucose control.;Long Term: Attainment of HbA1C < 7%.   Hypertension Yes   Intervention Provide education on lifestyle modifcations including regular physical activity/exercise, weight management, moderate sodium restriction and increased consumption of fresh fruit, vegetables, and low fat dairy, alcohol moderation, and smoking cessation.;Monitor prescription use compliance.   Expected Outcomes Short Term: Continued assessment and intervention until BP is < 140/72mm HG in hypertensive participants. < 130/25mm HG in hypertensive participants with diabetes, heart failure or chronic kidney disease.;Long Term: Maintenance of blood pressure at goal levels.   Stress Yes   Intervention Offer individual and/or small group education and counseling on adjustment to heart disease, stress management and health-related lifestyle change. Teach and support self-help strategies.;Refer participants experiencing significant psychosocial distress to appropriate mental health specialists for further evaluation and treatment. When possible, include family members and significant others in education/counseling sessions.   Expected Outcomes Short Term: Participant demonstrates changes in health-related behavior, relaxation and other stress management skills, ability to obtain effective social support, and compliance with psychotropic medications if prescribed.;Long Term: Emotional wellbeing is indicated by absence of clinically significant psychosocial distress or social isolation.       Personal Goals Discharge:     Goals and Risk Factor Review    Row Name 04/26/17 1446             Core Components/Risk Factors/Patient Goals Review   Personal Goals Review Lipids;Diabetes       Review [name]'s blood sugars have been stable on his days of attendance at cardiac rehab       Expected Outcomes Patient will continue to take his statin and diabetic medicaiton  as presribed upon DC from cardiac rehab          Exercise Goals and Review:     Exercise Goals    Row Name 03/07/17 1644             Exercise Goals   Increase Physical Activity Yes       Intervention Provide advice, education, support and counseling about physical activity/exercise needs.;Develop an individualized exercise prescription for aerobic and  resistive training based on initial evaluation findings, risk stratification, comorbidities and participant's personal goals.       Expected Outcomes Achievement of increased cardiorespiratory fitness and enhanced flexibility, muscular endurance and strength shown through measurements of functional capacity and personal statement of participant.       Increase Strength and Stamina Yes  improve blood flow to heart, build on exercise tolerance. Increase walking distance       Intervention Provide advice, education, support and counseling about physical activity/exercise needs.;Develop an individualized exercise prescription for aerobic and resistive training based on initial evaluation findings, risk stratification, comorbidities and participant's personal goals.       Expected Outcomes Achievement of increased cardiorespiratory fitness and enhanced flexibility, muscular endurance and strength shown through measurements of functional capacity and personal statement of participant.          Nutrition & Weight - Outcomes:     Pre Biometrics - 03/07/17 1645      Pre Biometrics   Waist Circumference 35.25 inches   Hip Circumference 37.5 inches   Waist to Hip Ratio 0.94 %   Triceps Skinfold 19 mm   % Body Fat 25.5 %   Grip Strength 37 kg   Flexibility 8 in       Nutrition:     Nutrition Therapy & Goals - 03/15/17 0938      Nutrition Therapy   Diet Therapeutic Lifestyle Changes     Personal Nutrition Goals   Nutrition Goal Pt to describe the benefit of including fruits, vegetables, whole grains, and low-fat dairy products in a  heart healthy meal plan.     Intervention Plan   Intervention Prescribe, educate and counsel regarding individualized specific dietary modifications aiming towards targeted core components such as weight, hypertension, lipid management, diabetes, heart failure and other comorbidities.   Expected Outcomes Short Term Goal: Understand basic principles of dietary content, such as calories, fat, sodium, cholesterol and nutrients.;Long Term Goal: Adherence to prescribed nutrition plan.      Nutrition Discharge:     Nutrition Assessments - 03/15/17 0952      MEDFICTS Scores   Pre Score 17      Education Questionnaire Score:     Knowledge Questionnaire Score - 03/15/17 0953      Knowledge Questionnaire Score   Pre Score 21/24   DM 9/15      [name] attended 5 exercise sessions between 03/07/17-03/29/2017. [name] attendance was poor due to his other obligations. [name] did not exercise on his last exercise session due to Atrial Fibrillation on 04/05/17. Rush Landmark says he is exercising on his own and does not plan to return to exercise at cardiac rehab.Barnet Pall, RN,BSN 06/08/2017 11:23 AM

## 2017-05-17 ENCOUNTER — Encounter (HOSPITAL_COMMUNITY)
Admission: RE | Admit: 2017-05-17 | Discharge: 2017-05-17 | Disposition: A | Discharge status: Home / Self Care | Payer: BLUE CROSS/BLUE SHIELD | Source: Ambulatory Visit | Attending: Internal Medicine | Admitting: Internal Medicine

## 2017-05-17 DIAGNOSIS — I5022 Chronic systolic (congestive) heart failure: Secondary | ICD-10-CM | POA: Insufficient documentation

## 2017-05-17 DIAGNOSIS — I4891 Unspecified atrial fibrillation: Secondary | ICD-10-CM | POA: Insufficient documentation

## 2017-05-17 DIAGNOSIS — Z79899 Other long term (current) drug therapy: Secondary | ICD-10-CM | POA: Insufficient documentation

## 2017-05-17 DIAGNOSIS — I110 Hypertensive heart disease with heart failure: Secondary | ICD-10-CM | POA: Insufficient documentation

## 2017-05-17 DIAGNOSIS — Z7901 Long term (current) use of anticoagulants: Secondary | ICD-10-CM | POA: Insufficient documentation

## 2017-05-17 DIAGNOSIS — E119 Type 2 diabetes mellitus without complications: Secondary | ICD-10-CM | POA: Insufficient documentation

## 2017-05-17 DIAGNOSIS — K219 Gastro-esophageal reflux disease without esophagitis: Secondary | ICD-10-CM | POA: Insufficient documentation

## 2017-05-19 ENCOUNTER — Encounter (HOSPITAL_COMMUNITY): Payer: BLUE CROSS/BLUE SHIELD

## 2017-05-22 ENCOUNTER — Encounter (HOSPITAL_COMMUNITY): Payer: BLUE CROSS/BLUE SHIELD

## 2017-05-24 ENCOUNTER — Encounter (HOSPITAL_COMMUNITY): Payer: BLUE CROSS/BLUE SHIELD

## 2017-05-26 ENCOUNTER — Encounter (HOSPITAL_COMMUNITY): Payer: BLUE CROSS/BLUE SHIELD

## 2017-05-28 ENCOUNTER — Other Ambulatory Visit (HOSPITAL_COMMUNITY): Payer: Self-pay | Admitting: Internal Medicine

## 2017-05-29 ENCOUNTER — Encounter (HOSPITAL_COMMUNITY): Payer: BLUE CROSS/BLUE SHIELD

## 2017-05-31 ENCOUNTER — Encounter (HOSPITAL_COMMUNITY): Payer: BLUE CROSS/BLUE SHIELD

## 2017-06-01 NOTE — Addendum Note (Signed)
Encounter addended by: Sol Passer on: 06/01/2017 11:46 AM<BR>    Actions taken: Flowsheet data copied forward, Visit Navigator Flowsheet section accepted, Vitals modified

## 2017-06-02 ENCOUNTER — Encounter (HOSPITAL_COMMUNITY): Payer: BLUE CROSS/BLUE SHIELD

## 2017-06-05 ENCOUNTER — Other Ambulatory Visit (HOSPITAL_COMMUNITY): Payer: Self-pay

## 2017-06-05 ENCOUNTER — Encounter (HOSPITAL_COMMUNITY): Payer: BLUE CROSS/BLUE SHIELD

## 2017-06-05 MED ORDER — SACUBITRIL-VALSARTAN 24-26 MG PO TABS: 1.0000 | ORAL_TABLET | Freq: Two times a day (BID) | ORAL | 3 refills | Status: DC

## 2017-06-07 ENCOUNTER — Encounter (HOSPITAL_COMMUNITY): Payer: BLUE CROSS/BLUE SHIELD

## 2017-07-03 ENCOUNTER — Other Ambulatory Visit (HOSPITAL_COMMUNITY): Payer: Self-pay | Admitting: Student

## 2017-07-13 ENCOUNTER — Other Ambulatory Visit: Payer: Self-pay

## 2017-07-13 ENCOUNTER — Ambulatory Visit (HOSPITAL_COMMUNITY)
Admission: RE | Admit: 2017-07-13 | Discharge: 2017-07-13 | Disposition: A | Discharge status: Home / Self Care | Payer: BLUE CROSS/BLUE SHIELD | Source: Ambulatory Visit | Attending: Internal Medicine | Admitting: Internal Medicine

## 2017-07-13 ENCOUNTER — Encounter (HOSPITAL_COMMUNITY): Payer: Self-pay | Admitting: Internal Medicine

## 2017-07-13 VITALS — BP 138/88 | HR 78 | Wt 158.5 lb

## 2017-07-13 DIAGNOSIS — Z8249 Family history of ischemic heart disease and other diseases of the circulatory system: Secondary | ICD-10-CM | POA: Diagnosis not present

## 2017-07-13 DIAGNOSIS — I480 Paroxysmal atrial fibrillation: Secondary | ICD-10-CM | POA: Diagnosis not present

## 2017-07-13 DIAGNOSIS — I110 Hypertensive heart disease with heart failure: Secondary | ICD-10-CM | POA: Insufficient documentation

## 2017-07-13 DIAGNOSIS — E119 Type 2 diabetes mellitus without complications: Secondary | ICD-10-CM | POA: Insufficient documentation

## 2017-07-13 DIAGNOSIS — I10 Essential (primary) hypertension: Secondary | ICD-10-CM | POA: Diagnosis not present

## 2017-07-13 DIAGNOSIS — I5022 Chronic systolic (congestive) heart failure: Secondary | ICD-10-CM | POA: Insufficient documentation

## 2017-07-13 DIAGNOSIS — Z79899 Other long term (current) drug therapy: Secondary | ICD-10-CM | POA: Diagnosis not present

## 2017-07-13 DIAGNOSIS — I4891 Unspecified atrial fibrillation: Secondary | ICD-10-CM

## 2017-07-13 DIAGNOSIS — I482 Chronic atrial fibrillation: Secondary | ICD-10-CM | POA: Diagnosis not present

## 2017-07-13 DIAGNOSIS — I2510 Atherosclerotic heart disease of native coronary artery without angina pectoris: Secondary | ICD-10-CM | POA: Insufficient documentation

## 2017-07-13 DIAGNOSIS — Z823 Family history of stroke: Secondary | ICD-10-CM | POA: Diagnosis not present

## 2017-07-13 DIAGNOSIS — K219 Gastro-esophageal reflux disease without esophagitis: Secondary | ICD-10-CM | POA: Insufficient documentation

## 2017-07-13 DIAGNOSIS — I350 Nonrheumatic aortic (valve) stenosis: Secondary | ICD-10-CM | POA: Diagnosis not present

## 2017-07-13 LAB — BASIC METABOLIC PANEL
Anion gap: 10 (ref 5–15)
BUN: 30 mg/dL — AB (ref 6–20)
CHLORIDE: 100 mmol/L — AB (ref 101–111)
CO2: 27 mmol/L (ref 22–32)
CREATININE: 1.65 mg/dL — AB (ref 0.61–1.24)
Calcium: 9 mg/dL (ref 8.9–10.3)
GFR, EST AFRICAN AMERICAN: 44 mL/min — AB (ref >60)
GFR, EST NON AFRICAN AMERICAN: 38 mL/min — AB (ref >60)
GLUCOSE: 114 mg/dL — AB (ref 65–99)
Potassium: 3.5 mmol/L (ref 3.5–5.1)
Sodium: 137 mmol/L (ref 135–145)

## 2017-07-13 LAB — DIGOXIN LEVEL: DIGOXIN LVL: 0.4 ng/mL — AB (ref 0.8–2.0)

## 2017-07-13 NOTE — Progress Notes (Signed)
ADVANCED HF CLINIC  NOTE  Referring Physician: Bonner Puna Primary Care: [facility] Cardiologist: Ola Spurr  HPI:  Ricardo [name] is a 81 y.o. male  with h/o HTN, diabetes chronic atrial fibrillation and a patent foramen ovale  Has had AF for years. Followed by Dr. Ola Spurr at [facility]. In reviewing records in [place] has also had an AFL ablation. Echo in . 12/13 EF 40-45%. Does not recall having a cath in past. Denies h/o of HF. Had sleep study in past and placed on CPAP. He was subsequently told OSA was mild and CPAP stopped.   In January began to develop HF symptoms with SOB, fatigue, LE edema, orthopnea and PND. Says in December was riding stationary bike 3 miles per day without problem. Had URI at end of December and got worse. Saw Dr. Ola Spurr in January. Started lasix 20mg  PRN and ordered echo and Holter monitor due to elevated AF rate.   Admitted to [facility] in 3/18 with worseningg HF. BNP 957. AF rate elevated. Echo 40-45% with moderate RV dysfunction. Mild AS. Diuresed 9 pounds and placed on metoprolol and digoxin for HR control. Weight on d/c 145. Sent home on lasix 40 daily.   Today he returns for HF follow up. Feeling well. Had diverticulitis over Thanksgiving. Now improved. Had to stop spiro due to painful gynecomastia. At last visit AF rate was elevated so digoxin started back and Toprol increased. HRs at home in 70s. Had 48-hour monitor mean VR 84 bpm. (10% PVCs) Riding stationary bike at home without problem.   Echo 11/06/16 LVEF 40%, Mild/Mod MR, Severe LAE, Severe RAE, + Patent foramen ovale  Cath 11/15/16 Very heavily calcified coronary arteries with high grade lesion in moderate-sized OM1 and tandem 70% and 80% lesions in mid LAD. Distal PDA 90%. Treated medically.   RA =  6 RV = 48/6 PA = 48/19 (29) PCW = 17 Ao = 135/89 (108) LV = 147/14 Fick cardiac output/index = 3.8/2.2 PVR = 3.2 SVR = 2131 FA sat = 98% PA sat = 67%. 69%  Echo 12/13 EF  40-45%   Past Medical History:  Diagnosis Date  . Atrial fibrillation (HCC)    on coumadin.  cardiologist is in [facility] dr [name].  S/P multiple DCCVs and prior ablation.   Marland Kitchen BPH (benign prostatic hyperplasia)    by ST in 2000.   Marland Kitchen CHF (congestive heart failure) ([place])   . Complication of anesthesia    " sometimes slow to wake up"  . Diabetes mellitus without complication ([place])   . Diverticulitis    question if he ever had imaging confirmed diverticulitis  . GERD (gastroesophageal reflux disease)   . GI bleed   . Gout   . Hypertension   . Kidney stones    treated with lithotripsy   . Pneumonia   . PONV (postoperative nausea and vomiting)   . Rupture of biceps tendon 2006   left distal bicep tendon  . UTI (urinary tract infection)     Current Outpatient Medications  Medication Sig Dispense Refill  . apixaban (ELIQUIS) 2.5 MG TABS tablet Take 1 tablet (2.5 mg total) by mouth 2 (two) times daily. 60 tablet 3  . b complex vitamins tablet Take 1 tablet by mouth daily.    . Cholecalciferol (VITAMIN D3) 2000 units TABS Take 2,000 Units by mouth daily.    . Cinnamon 500 MG capsule Take 1,000 mg by mouth daily.    . Coenzyme Q10 (CO Q10) 200 MG  CAPS Take 200 mcg by mouth daily.    . cyanocobalamin 100 MCG tablet Take 1,000 mcg by mouth daily.    Marland Kitchen dexlansoprazole (DEXILANT) 60 MG capsule Take 60 mg by mouth daily.    . digoxin (LANOXIN) 0.125 MG tablet Take 0.5 tablets (0.0625 mg total) by mouth daily. 90 tablet 3  . docusate sodium (COLACE) 100 MG capsule Take 100 mg by mouth daily as needed for mild constipation.    Marland Kitchen ELIQUIS 2.5 MG TABS tablet TAKE 1 TABLET(2.5 MG) BY MOUTH TWICE DAILY 60 tablet 5  . empagliflozin (JARDIANCE) 10 MG TABS tablet Take 10 mg by mouth daily.    . hydrocortisone (ANUSOL-HC) 25 MG suppository Place 1 suppository (25 mg total) rectally 2 (two) times daily. 12 suppository 0  . Magnesium Oxide 250 MG TABS Take 250 mg by mouth daily.    .  metoprolol succinate (TOPROL-XL) 50 MG 24 hr tablet Take 1 tablet (50 mg total) by mouth daily. 30 tablet 6  . potassium chloride SA (K-DUR,KLOR-CON) 20 MEQ tablet TAKE 1 TABLET BY MOUTH DAILY 30 tablet 3  . ranitidine (ZANTAC) 150 MG capsule Take 150 mg by mouth daily as needed for heartburn.    . sacubitril-valsartan (ENTRESTO) 24-26 MG Take 1 tablet by mouth 2 (two) times daily. 60 tablet 3  . torsemide (DEMADEX) 20 MG tablet Take 10 mg by mouth daily.    . vitamin C (ASCORBIC ACID) 250 MG tablet Take 250 mg by mouth daily.     No current facility-administered medications for this encounter.    Allergies  Allergen Reactions  . Codeine Other (See Comments)    "Gives me headaches"  . Tape     Adhesive tape caused bruising; use paper tape only.   Social History   Socioeconomic History  . Marital status: Married    Spouse name: Not on file  . Number of children: Not on file  . Years of education: Not on file  . Highest education level: Not on file  Social Needs  . Financial resource strain: Not on file  . Food insecurity - worry: Not on file  . Food insecurity - inability: Not on file  . Transportation needs - medical: Not on file  . Transportation needs - non-medical: Not on file  Occupational History  . Not on file  Tobacco Use  . Smoking status: Never Smoker  . Smokeless tobacco: Never Used  Substance and Sexual Activity  . Alcohol use: Yes    Comment: rare glass of wine  . Drug use: No  . Sexual activity: Not on file  Other Topics Concern  . Not on file  Social History Narrative  . Not on file     Family History  Problem Relation Age of Onset  . Tuberculosis Mother   . Stroke Father   . Congestive Heart Failure Father     Vitals:   07/13/17 1356  BP: 138/88  Pulse: 78  SpO2: 100%  Weight: 158 lb 8 oz (71.9 kg)   Wt Readings from Last 3 Encounters:  07/13/17 158 lb 8 oz (71.9 kg)  04/12/17 157 lb 4 oz (71.3 kg)  03/29/17 154 lb 5.2 oz (70 kg)     PHYSICAL EXAM: General:  Well appearing. No resp difficulty HEENT: normal Neck: supple. no JVD. Carotids 2+ bilat; no bruits. No lymphadenopathy or thryomegaly appreciated. Cor: PMI nondisplaced. IRR IRR 2/6 AS  Lungs: clear Abdomen: soft, nontender, nondistended. No hepatosplenomegaly. No bruits or masses. Good  bowel sounds. Extremities: no cyanosis, clubbing, rash, edema Neuro: alert & orientedx3, cranial nerves grossly intact. moves all 4 extremities w/o difficulty. Affect pleasant  ECG: NSR 84 with RRRB and long 1AVB Personally reviewed    ASSESSMENT & PLAN: 1. Chronic systolic HF - Echo 0/17/79 EF 40-45% with moderate RV dysfunction. Seems out of proportion to CAD. ? PVC related with 10% PVCs on recent Holter. - SPEP negative - NYHA I. Volume status ok. -  Will get cMRI (ordered previously but insurance company refused) to help further elucidate. If unrevealing consider trial of PVC suppression to see if EF improves - Back on low dose digoxin 0.0625 mg daily.    - Continue entresto to 49-51 mg twice a day.   - unable to tolerate spiro due to severe gynecomastia  2. Chronic AF  - Recently with RVR. Improved with digoxin 0.0625 daily and increased Torpol at 50 daily. Monitor with good rate control - in NSR on ECG today which was a surprise - CHADSVASC = 5 (age, DM, HTN, HF) - Continue Eliquis. No further bleeding.    3. Mild aortic stenosis - Moderately calcified on Echo 11/06/16. - Mean gradient 9 mm Hg, Peak gradient 15 mm Hg  4. HTN  - Blood pressure well controlled. Continue current regimen.  5. DM2 - Per PCP. Consider Jardiance  6. Suspected sleep apnea.  - Sleep study was negative.    7. H/o LGIB - Follows with Dr. Collene Mares. No clear reason for colonoscopy unless he rebleeds.   8. CAD - He has highly calcified coronary arteries with borderline lesion in midLAD.  - No s/s ischemia. Continue medical therapy   Glori Bickers, MD  2:17 PM

## 2017-07-13 NOTE — Patient Instructions (Signed)
Labs today  Your physician has requested that you have a cardiac MRI. Cardiac MRI uses a computer to create images of your heart as its beating, producing both still and moving pictures of your heart and major blood vessels. For further information please visit [email or web address] Please follow the instruction sheet given to you today for more information.  Your physician recommends that you schedule a follow-up appointment in: 3 month

## 2017-07-31 ENCOUNTER — Other Ambulatory Visit (HOSPITAL_COMMUNITY): Payer: Self-pay | Admitting: Internal Medicine

## 2017-09-11 ENCOUNTER — Encounter: Payer: Self-pay | Admitting: Internal Medicine

## 2017-09-11 ENCOUNTER — Telehealth: Payer: Self-pay | Admitting: Internal Medicine

## 2017-09-11 NOTE — Telephone Encounter (Signed)
Called the patient regarding his cardiac MRI scheduled 09-14-17 at 12 noon.

## 2017-09-14 ENCOUNTER — Ambulatory Visit (HOSPITAL_COMMUNITY)
Admission: RE | Admit: 2017-09-14 | Discharge: 2017-09-14 | Disposition: A | Discharge status: Home / Self Care | Payer: Medicare Other | Source: Ambulatory Visit | Attending: Internal Medicine | Admitting: Internal Medicine

## 2017-09-14 DIAGNOSIS — Q211 Atrial septal defect: Secondary | ICD-10-CM | POA: Insufficient documentation

## 2017-09-14 DIAGNOSIS — I340 Nonrheumatic mitral (valve) insufficiency: Secondary | ICD-10-CM | POA: Insufficient documentation

## 2017-09-14 DIAGNOSIS — I5022 Chronic systolic (congestive) heart failure: Secondary | ICD-10-CM | POA: Insufficient documentation

## 2017-09-14 DIAGNOSIS — I429 Cardiomyopathy, unspecified: Secondary | ICD-10-CM | POA: Diagnosis not present

## 2017-09-14 LAB — CREATININE, SERUM
CREATININE: 1.47 mg/dL — AB (ref 0.61–1.24)
GFR calc non Af Amer: 43 mL/min — ABNORMAL LOW (ref >60)
GFR, EST AFRICAN AMERICAN: 50 mL/min — AB (ref >60)

## 2017-09-14 MED ORDER — GADOBENATE DIMEGLUMINE 529 MG/ML IV SOLN
25.0000 mL | Freq: Once | INTRAVENOUS | Status: AC
  Administered 2017-09-14: 25 mL via INTRAVENOUS

## 2017-10-05 DIAGNOSIS — N183 Chronic kidney disease, stage 3 (moderate): Secondary | ICD-10-CM | POA: Diagnosis not present

## 2017-10-05 DIAGNOSIS — D631 Anemia in chronic kidney disease: Secondary | ICD-10-CM | POA: Diagnosis not present

## 2017-10-05 DIAGNOSIS — N2581 Secondary hyperparathyroidism of renal origin: Secondary | ICD-10-CM | POA: Diagnosis not present

## 2017-10-05 DIAGNOSIS — I129 Hypertensive chronic kidney disease with stage 1 through stage 4 chronic kidney disease, or unspecified chronic kidney disease: Secondary | ICD-10-CM | POA: Diagnosis not present

## 2017-10-06 ENCOUNTER — Other Ambulatory Visit (HOSPITAL_COMMUNITY): Payer: Self-pay | Admitting: *Deleted

## 2017-10-06 MED ORDER — SACUBITRIL-VALSARTAN 24-26 MG PO TABS: 1.0000 | ORAL_TABLET | Freq: Two times a day (BID) | ORAL | 3 refills | Status: DC

## 2017-10-12 ENCOUNTER — Ambulatory Visit (HOSPITAL_COMMUNITY)
Admission: RE | Admit: 2017-10-12 | Discharge: 2017-10-12 | Disposition: A | Discharge status: Home / Self Care | Payer: Medicare Other | Source: Ambulatory Visit | Attending: Internal Medicine | Admitting: Internal Medicine

## 2017-10-12 ENCOUNTER — Encounter (HOSPITAL_COMMUNITY): Payer: Self-pay | Admitting: Internal Medicine

## 2017-10-12 ENCOUNTER — Other Ambulatory Visit: Payer: Self-pay

## 2017-10-12 VITALS — BP 136/80 | HR 84 | Wt 161.5 lb

## 2017-10-12 DIAGNOSIS — I110 Hypertensive heart disease with heart failure: Secondary | ICD-10-CM | POA: Diagnosis not present

## 2017-10-12 DIAGNOSIS — Z7901 Long term (current) use of anticoagulants: Secondary | ICD-10-CM | POA: Insufficient documentation

## 2017-10-12 DIAGNOSIS — Z885 Allergy status to narcotic agent status: Secondary | ICD-10-CM | POA: Insufficient documentation

## 2017-10-12 DIAGNOSIS — I5022 Chronic systolic (congestive) heart failure: Secondary | ICD-10-CM

## 2017-10-12 DIAGNOSIS — G4733 Obstructive sleep apnea (adult) (pediatric): Secondary | ICD-10-CM | POA: Diagnosis not present

## 2017-10-12 DIAGNOSIS — I2510 Atherosclerotic heart disease of native coronary artery without angina pectoris: Secondary | ICD-10-CM | POA: Diagnosis not present

## 2017-10-12 DIAGNOSIS — I482 Chronic atrial fibrillation: Secondary | ICD-10-CM

## 2017-10-12 DIAGNOSIS — K219 Gastro-esophageal reflux disease without esophagitis: Secondary | ICD-10-CM | POA: Insufficient documentation

## 2017-10-12 DIAGNOSIS — Z888 Allergy status to other drugs, medicaments and biological substances status: Secondary | ICD-10-CM | POA: Diagnosis not present

## 2017-10-12 DIAGNOSIS — E119 Type 2 diabetes mellitus without complications: Secondary | ICD-10-CM | POA: Insufficient documentation

## 2017-10-12 DIAGNOSIS — I350 Nonrheumatic aortic (valve) stenosis: Secondary | ICD-10-CM

## 2017-10-12 DIAGNOSIS — Z79899 Other long term (current) drug therapy: Secondary | ICD-10-CM | POA: Diagnosis not present

## 2017-10-12 DIAGNOSIS — M109 Gout, unspecified: Secondary | ICD-10-CM | POA: Insufficient documentation

## 2017-10-12 DIAGNOSIS — I4821 Permanent atrial fibrillation: Secondary | ICD-10-CM

## 2017-10-12 LAB — BASIC METABOLIC PANEL
Anion gap: 12 (ref 5–15)
BUN: 25 mg/dL — ABNORMAL HIGH (ref 6–20)
CHLORIDE: 100 mmol/L — AB (ref 101–111)
CO2: 28 mmol/L (ref 22–32)
CREATININE: 1.28 mg/dL — AB (ref 0.61–1.24)
Calcium: 9.2 mg/dL (ref 8.9–10.3)
GFR calc non Af Amer: 51 mL/min — ABNORMAL LOW (ref >60)
GFR, EST AFRICAN AMERICAN: 59 mL/min — AB (ref >60)
GLUCOSE: 140 mg/dL — AB (ref 65–99)
Potassium: 3.7 mmol/L (ref 3.5–5.1)
Sodium: 140 mmol/L (ref 135–145)

## 2017-10-12 NOTE — Progress Notes (Signed)
ADVANCED HF CLINIC  NOTE  Referring Physician: Bonner Puna Primary Care: [facility] Primary Cardiologist: Ola Spurr  HPI:  Ricardo [name] is a 82 y.o. male  with h/o HTN, diabetes, CAD and chronic atrial fibrillation.  Has had AF for years. Followed by Dr. Ola Spurr at [facility]. In reviewing records in [place] has also had an AFL ablation. Echo in 12/13 EF 40-45%.. Had sleep study in past and placed on CPAP. He was subsequently told OSA was mild and CPAP stopped.   In January 2018 began to develop HF symptoms. Says in December was riding stationary bike 3 miles per day without problem. Admitted to [facility] in 3/18 with worseningg HF. BNP 957. AF rate elevated. Echo 40-45% with moderate RV dysfunction. Mild AS. Diuresed 9 pounds and placed on metoprolol and digoxin for HR control. Weight on d/c 145. Sent home on lasix 40 daily.  Cath 4/18 Very heavily calcified coronary arteries with high grade lesion in moderate-sized OM1 and tandem 70% and 80% lesions in mid LAD. Distal PDA 90%. Treated medically.   Today he returns for HF follow up. Overall feeling great. Biking 3-5 miles/day with no symptoms. No CP, SOB or palpitations. No dizziness, presyncope, orthopnea, edema, or PND. No blood in urine or stool. Weights 155 lb every day. Compliant with medications.   cMRI 1/19  1) Mild LVE with moderate LVH Diffuse hypokinesis worse in the inferolateral wall. Quantitative EF 43% 2) SEMI seen in basal anterolateral and inferolateral walls as well as mid inferolateral wall 3) Severe LAE Moderate RAE 4) PFO present 5) Moderate appearing MR 6) Tri- leaflet AV with some thickening and restricted motion suggest echo correlation  Echo 11/06/16 LVEF 40%, Mild/Mod MR, Severe LAE, Severe RAE, + Patent foramen ovale  Cath 11/15/16 Very heavily calcified coronary arteries with high grade lesion in moderate-sized OM1 and tandem 70% and 80% lesions in mid LAD. Distal PDA 90%. Treated medically.   RA =  6 RV =  48/6 PA = 48/19 (29) PCW = 17 Ao = 135/89 (108) LV = 147/14 Fick cardiac output/index = 3.8/2.2 PVR = 3.2 SVR = 2131 FA sat = 98% PA sat = 67%. 69%    Past Medical History:  Diagnosis Date  . Atrial fibrillation (HCC)    on coumadin.  cardiologist is in [facility] dr [name].  S/P multiple DCCVs and prior ablation.   Marland Kitchen BPH (benign prostatic hyperplasia)    by ST in 2000.   Marland Kitchen CHF (congestive heart failure) ([place])   . Complication of anesthesia    " sometimes slow to wake up"  . Diabetes mellitus without complication ([place])   . Diverticulitis    question if he ever had imaging confirmed diverticulitis  . GERD (gastroesophageal reflux disease)   . GI bleed   . Gout   . Hypertension   . Kidney stones    treated with lithotripsy   . Pneumonia   . PONV (postoperative nausea and vomiting)   . Rupture of biceps tendon 2006   left distal bicep tendon  . UTI (urinary tract infection)     Current Outpatient Medications  Medication Sig Dispense Refill  . apixaban (ELIQUIS) 2.5 MG TABS tablet Take 1 tablet (2.5 mg total) by mouth 2 (two) times daily. 60 tablet 3  . b complex vitamins tablet Take 1 tablet by mouth daily.    . Cholecalciferol (VITAMIN D3) 2000 units TABS Take 2,000 Units by mouth daily.    . Cinnamon 500 MG capsule Take  1,000 mg by mouth daily.    . Coenzyme Q10 (CO Q10) 200 MG CAPS Take 200 mcg by mouth daily.    . cyanocobalamin 100 MCG tablet Take 1,000 mcg by mouth daily.    Marland Kitchen dexlansoprazole (DEXILANT) 60 MG capsule Take 60 mg by mouth daily.    . digoxin (LANOXIN) 0.125 MG tablet Take 0.5 tablets (0.0625 mg total) by mouth daily. 90 tablet 3  . docusate sodium (COLACE) 100 MG capsule Take 100 mg by mouth daily as needed for mild constipation.    . empagliflozin (JARDIANCE) 10 MG TABS tablet Take 10 mg by mouth daily.    . hydrocortisone (ANUSOL-HC) 25 MG suppository Place 1 suppository (25 mg total) rectally 2 (two) times daily. 12 suppository 0  .  Magnesium Oxide 250 MG TABS Take 250 mg by mouth daily.    . metoprolol succinate (TOPROL-XL) 50 MG 24 hr tablet Take 1 tablet (50 mg total) by mouth daily. 30 tablet 6  . potassium chloride SA (K-DUR,KLOR-CON) 20 MEQ tablet TAKE 1 TABLET BY MOUTH DAILY 30 tablet 3  . ranitidine (ZANTAC) 150 MG capsule Take 150 mg by mouth daily as needed for heartburn.    . sacubitril-valsartan (ENTRESTO) 24-26 MG Take 1 tablet by mouth 2 (two) times daily. 60 tablet 3  . torsemide (DEMADEX) 20 MG tablet Take 10 mg by mouth daily.    . vitamin C (ASCORBIC ACID) 250 MG tablet Take 250 mg by mouth daily.     No current facility-administered medications for this encounter.    Allergies  Allergen Reactions  . Codeine Other (See Comments)    "Gives me headaches"  . Tape     Adhesive tape caused bruising; use paper tape only.   Social History   Socioeconomic History  . Marital status: Married    Spouse name: Not on file  . Number of children: Not on file  . Years of education: Not on file  . Highest education level: Not on file  Social Needs  . Financial resource strain: Not on file  . Food insecurity - worry: Not on file  . Food insecurity - inability: Not on file  . Transportation needs - medical: Not on file  . Transportation needs - non-medical: Not on file  Occupational History  . Not on file  Tobacco Use  . Smoking status: Never Smoker  . Smokeless tobacco: Never Used  Substance and Sexual Activity  . Alcohol use: Yes    Comment: rare glass of wine  . Drug use: No  . Sexual activity: Not on file  Other Topics Concern  . Not on file  Social History Narrative  . Not on file     Family History  Problem Relation Age of Onset  . Tuberculosis Mother   . Stroke Father   . Congestive Heart Failure Father     Vitals:   10/12/17 1442  BP: 136/80  Pulse: 84  SpO2: 100%  Weight: 161 lb 8 oz (73.3 kg)   Wt Readings from Last 3 Encounters:  10/12/17 161 lb 8 oz (73.3 kg)  07/13/17  158 lb 8 oz (71.9 kg)  04/12/17 157 lb 4 oz (71.3 kg)    PHYSICAL EXAM: General: Elderly. No resp difficulty. HEENT: Normal Neck: Supple. JVP flat. Carotids 2+ bilat; no bruits. No thyromegaly or nodule noted. Cor: PMI nondisplaced. Irregular rhythm, 2/6 AS Lungs: CTAB, normal effort. Abdomen: Soft, non-tender, non-distended, no HSM. No bruits or masses. +BS  Extremities: No cyanosis,  clubbing, or rash. R and LLE no edema.  Neuro: Alert & orientedx3, cranial nerves grossly intact. moves all 4 extremities w/o difficulty. Affect pleasant    ASSESSMENT & PLAN: 1. Chronic systolic HF - Echo 5/37/48 EF 40-45% with moderate RV dysfunction. Seems out of proportion to CAD. ? PVC related with 10% PVCs on Holter 9/18. - SPEP negative. CMRI no infiltrative process EF 43% - NYHA I. Volume status stable on torsemide 10 mg daily - Continue low dose digoxin 0.0625 mg daily.    - Continue entresto to 49-51 mg twice a day.   - unable to tolerate spiro due to severe gynecomastia - Check BMET today  2. Chronic AF  - Recently with RVR. Improved with digoxin 0.0625 daily and increased Torpol at 50 daily. Monitor with good rate control - CHADSVASC = 5 (age, DM, HTN, HF) - Continue Eliquis. No bleeding.    3. Mild aortic stenosis - Moderately calcified on Echo 11/06/16. - Mean gradient 9 mm Hg, Peak gradient 15 mm Hg  4. HTN  - Blood pressure well controlled. Continue current regimen.  5. DM2 - Per PCP. Consider Jardiance  6. Suspected sleep apnea.  - Sleep study was negative.    7. H/o LGIB - Follows with Dr. Collene Mares. No clear reason for colonoscopy unless he rebleeds.   8. CAD - He has highly calcified coronary arteries with borderline lesion in midLAD.  - No s/s ischemia. Continue medical therapy   BMET today.  Follow up in 4 months  Georgiana Shore, NP  2:47 PM   Patient seen and examined with the above-signed Advanced Practice Provider and/or Housestaff. I personally reviewed  laboratory data, imaging studies and relevant notes. I independently examined the patient and formulated the important aspects of the plan. I have edited the note to reflect any of my changes or salient points. I have personally discussed the plan with the patient and/or family.  Doing very well. No ischemic or HF symptoms. AF rate controlled. Mild AS on exam and echo. Wikll continue to follow. Check labs today.   Glori Bickers, MD  9:37 PM

## 2017-10-12 NOTE — Patient Instructions (Signed)
Lab today  Your physician recommends that you schedule a follow-up appointment in: 4 months  

## 2017-11-03 ENCOUNTER — Other Ambulatory Visit (HOSPITAL_COMMUNITY): Payer: Self-pay | Admitting: Internal Medicine

## 2017-11-08 ENCOUNTER — Other Ambulatory Visit (HOSPITAL_COMMUNITY): Payer: Self-pay | Admitting: Internal Medicine

## 2017-11-29 ENCOUNTER — Other Ambulatory Visit (HOSPITAL_COMMUNITY): Payer: Self-pay | Admitting: Internal Medicine

## 2017-12-04 ENCOUNTER — Other Ambulatory Visit (HOSPITAL_COMMUNITY): Payer: Self-pay | Admitting: Internal Medicine

## 2017-12-04 MED ORDER — POTASSIUM CHLORIDE CRYS ER 20 MEQ PO TBCR: 20.0000 meq | EXTENDED_RELEASE_TABLET | Freq: Every day | ORAL | 3 refills | Status: DC

## 2017-12-04 MED ORDER — DIGOXIN 125 MCG PO TABS: 0.0625 mg | ORAL_TABLET | Freq: Every day | ORAL | 3 refills | Status: DC

## 2018-01-15 DIAGNOSIS — N183 Chronic kidney disease, stage 3 (moderate): Secondary | ICD-10-CM | POA: Diagnosis not present

## 2018-01-15 DIAGNOSIS — Z23 Encounter for immunization: Secondary | ICD-10-CM | POA: Diagnosis not present

## 2018-01-15 DIAGNOSIS — I5022 Chronic systolic (congestive) heart failure: Secondary | ICD-10-CM | POA: Diagnosis not present

## 2018-01-15 DIAGNOSIS — Z0000 Encounter for general adult medical examination without abnormal findings: Secondary | ICD-10-CM | POA: Diagnosis not present

## 2018-01-15 DIAGNOSIS — E119 Type 2 diabetes mellitus without complications: Secondary | ICD-10-CM | POA: Diagnosis not present

## 2018-01-15 DIAGNOSIS — I480 Paroxysmal atrial fibrillation: Secondary | ICD-10-CM | POA: Diagnosis not present

## 2018-02-06 ENCOUNTER — Other Ambulatory Visit (HOSPITAL_COMMUNITY): Payer: Self-pay

## 2018-02-06 MED ORDER — SACUBITRIL-VALSARTAN 24-26 MG PO TABS: 1.0000 | ORAL_TABLET | Freq: Two times a day (BID) | ORAL | 3 refills | Status: DC

## 2018-02-07 ENCOUNTER — Ambulatory Visit (HOSPITAL_COMMUNITY)
Admission: RE | Admit: 2018-02-07 | Discharge: 2018-02-07 | Disposition: A | Discharge status: Home / Self Care | Payer: Medicare Other | Source: Ambulatory Visit | Attending: Internal Medicine | Admitting: Internal Medicine

## 2018-02-07 VITALS — BP 153/103 | HR 64 | Wt 160.8 lb

## 2018-02-07 DIAGNOSIS — I482 Chronic atrial fibrillation: Secondary | ICD-10-CM | POA: Diagnosis not present

## 2018-02-07 DIAGNOSIS — G4733 Obstructive sleep apnea (adult) (pediatric): Secondary | ICD-10-CM | POA: Diagnosis not present

## 2018-02-07 DIAGNOSIS — I110 Hypertensive heart disease with heart failure: Secondary | ICD-10-CM | POA: Diagnosis not present

## 2018-02-07 DIAGNOSIS — E119 Type 2 diabetes mellitus without complications: Secondary | ICD-10-CM | POA: Insufficient documentation

## 2018-02-07 DIAGNOSIS — K219 Gastro-esophageal reflux disease without esophagitis: Secondary | ICD-10-CM | POA: Insufficient documentation

## 2018-02-07 DIAGNOSIS — I350 Nonrheumatic aortic (valve) stenosis: Secondary | ICD-10-CM | POA: Diagnosis not present

## 2018-02-07 DIAGNOSIS — I484 Atypical atrial flutter: Secondary | ICD-10-CM | POA: Diagnosis not present

## 2018-02-07 DIAGNOSIS — R040 Epistaxis: Secondary | ICD-10-CM | POA: Diagnosis not present

## 2018-02-07 DIAGNOSIS — N62 Hypertrophy of breast: Secondary | ICD-10-CM | POA: Insufficient documentation

## 2018-02-07 DIAGNOSIS — I2510 Atherosclerotic heart disease of native coronary artery without angina pectoris: Secondary | ICD-10-CM | POA: Insufficient documentation

## 2018-02-07 DIAGNOSIS — I10 Essential (primary) hypertension: Secondary | ICD-10-CM | POA: Diagnosis not present

## 2018-02-07 DIAGNOSIS — M109 Gout, unspecified: Secondary | ICD-10-CM | POA: Insufficient documentation

## 2018-02-07 DIAGNOSIS — Q211 Atrial septal defect: Secondary | ICD-10-CM | POA: Diagnosis not present

## 2018-02-07 DIAGNOSIS — Z7901 Long term (current) use of anticoagulants: Secondary | ICD-10-CM | POA: Insufficient documentation

## 2018-02-07 DIAGNOSIS — I480 Paroxysmal atrial fibrillation: Secondary | ICD-10-CM | POA: Diagnosis not present

## 2018-02-07 DIAGNOSIS — I5022 Chronic systolic (congestive) heart failure: Secondary | ICD-10-CM

## 2018-02-07 DIAGNOSIS — Z79899 Other long term (current) drug therapy: Secondary | ICD-10-CM | POA: Insufficient documentation

## 2018-02-07 DIAGNOSIS — Z885 Allergy status to narcotic agent status: Secondary | ICD-10-CM | POA: Diagnosis not present

## 2018-02-07 DIAGNOSIS — I4510 Unspecified right bundle-branch block: Secondary | ICD-10-CM | POA: Insufficient documentation

## 2018-02-07 DIAGNOSIS — Z87442 Personal history of urinary calculi: Secondary | ICD-10-CM | POA: Insufficient documentation

## 2018-02-07 LAB — BASIC METABOLIC PANEL
Anion gap: 11 (ref 5–15)
BUN: 20 mg/dL (ref 8–23)
CHLORIDE: 101 mmol/L (ref 98–111)
CO2: 31 mmol/L (ref 22–32)
Calcium: 9.8 mg/dL (ref 8.9–10.3)
Creatinine, Ser: 1.45 mg/dL — ABNORMAL HIGH (ref 0.61–1.24)
GFR calc non Af Amer: 44 mL/min — ABNORMAL LOW (ref >60)
GFR, EST AFRICAN AMERICAN: 51 mL/min — AB (ref >60)
GLUCOSE: 147 mg/dL — AB (ref 70–99)
POTASSIUM: 4.1 mmol/L (ref 3.5–5.1)
Sodium: 143 mmol/L (ref 135–145)

## 2018-02-07 LAB — DIGOXIN LEVEL: Digoxin Level: 0.5 ng/mL — ABNORMAL LOW (ref 0.8–2.0)

## 2018-02-07 LAB — CBC
HEMATOCRIT: 53.8 % — AB (ref 39.0–52.0)
HEMOGLOBIN: 17.8 g/dL — AB (ref 13.0–17.0)
MCH: 29.4 pg (ref 26.0–34.0)
MCHC: 33.1 g/dL (ref 30.0–36.0)
MCV: 88.8 fL (ref 78.0–100.0)
Platelets: 177 10*3/uL (ref 150–400)
RBC: 6.06 MIL/uL — ABNORMAL HIGH (ref 4.22–5.81)
RDW: 14.3 % (ref 11.5–15.5)
WBC: 7.6 10*3/uL (ref 4.0–10.5)

## 2018-02-07 LAB — BRAIN NATRIURETIC PEPTIDE: B Natriuretic Peptide: 522.4 pg/mL — ABNORMAL HIGH (ref 0.0–100.0)

## 2018-02-07 NOTE — Patient Instructions (Signed)
Labs today  We will contact you in 6 months to schedule your next appointment and echocardiogram  

## 2018-02-07 NOTE — Progress Notes (Signed)
ADVANCED HF CLINIC  NOTE  Referring Physician: Bonner Puna Primary Care: [facility] Primary Cardiologist: Ola Spurr  HPI:  Ricardo [name] is a 82 y.o. male  with h/o HTN, diabetes, CAD and chronic atrial fibrillation.  Has had AF for years. Followed by Dr. Ola Spurr at [facility]. In reviewing records in [place] has also had an AFL ablation. Echo in 12/13 EF 40-45%.. Had sleep study in past and placed on CPAP. He was subsequently told OSA was mild and CPAP stopped.   In January 2018 began to develop HF symptoms. Says in December was riding stationary bike 3 miles per day without problem. Admitted to [facility] in 3/18 with worseningg HF. BNP 957. AF rate elevated. Echo 40-45% with moderate RV dysfunction. Mild AS. Diuresed 9 pounds and placed on metoprolol and digoxin for HR control. Weight on d/c 145. Sent home on lasix 40 daily.  Cath 4/18 Very heavily calcified coronary arteries with high grade lesion in moderate-sized OM1 and tandem 70% and 80% lesions in mid LAD. Distal PDA 90%. Treated medically.   Today he returns for HF follow up. Feels great. Biking 3-4 miles/day on stationary bike. Takes about 25 mins. No CP or SOB. No dizziness. No significant bleeding on   Biking 3-5 miles/day with no symptoms. No CP, SOB or palpitations. No dizziness, presyncope, orthopnea, edema, or PND. No blood in urine or stool. Weights 155 lb every day. Compliant with medications.   cMRI 1/19  1) Mild LVE with moderate LVH Diffuse hypokinesis worse in the inferolateral wall. Quantitative EF 43% 2) SEMI seen in basal anterolateral and inferolateral walls as well as mid inferolateral wall 3) Severe LAE Moderate RAE 4) PFO present 5) Moderate appearing MR 6) Tri- leaflet AV with some thickening and restricted motion suggest echo correlation  Echo 11/06/16 LVEF 40%, Mild/Mod MR, Severe LAE, Severe RAE, + Patent foramen ovale  Cath 11/15/16 Very heavily calcified coronary arteries with high grade lesion in  moderate-sized OM1 and tandem 70% and 80% lesions in mid LAD. Distal PDA 90%. Treated medically.   RA =  6 RV = 48/6 PA = 48/19 (29) PCW = 17 Ao = 135/89 (108) LV = 147/14 Fick cardiac output/index = 3.8/2.2 PVR = 3.2 SVR = 2131 FA sat = 98% PA sat = 67%. 69%    Past Medical History:  Diagnosis Date  . Atrial fibrillation (HCC)    on coumadin.  cardiologist is in [facility] dr [name].  S/P multiple DCCVs and prior ablation.   Marland Kitchen BPH (benign prostatic hyperplasia)    by ST in 2000.   Marland Kitchen CHF (congestive heart failure) ([place])   . Complication of anesthesia    " sometimes slow to wake up"  . Diabetes mellitus without complication ([place])   . Diverticulitis    question if he ever had imaging confirmed diverticulitis  . GERD (gastroesophageal reflux disease)   . GI bleed   . Gout   . Hypertension   . Kidney stones    treated with lithotripsy   . Pneumonia   . PONV (postoperative nausea and vomiting)   . Rupture of biceps tendon 2006   left distal bicep tendon  . UTI (urinary tract infection)     Current Outpatient Medications  Medication Sig Dispense Refill  . apixaban (ELIQUIS) 2.5 MG TABS tablet Take 1 tablet (2.5 mg total) by mouth 2 (two) times daily. 60 tablet 3  . b complex vitamins tablet Take 1 tablet by mouth daily.    Marland Kitchen  Cinnamon 500 MG capsule Take 1,000 mg by mouth daily.    . Coenzyme Q10 (CO Q10) 200 MG CAPS Take 200 mcg by mouth daily.    . cyanocobalamin 100 MCG tablet Take 1,000 mcg by mouth daily.    . digoxin (DIGOX) 0.125 MG tablet Take 0.5 tablets (0.0625 mg total) by mouth daily. 15 tablet 3  . ELIQUIS 2.5 MG TABS tablet TAKE 1 TABLET(2.5 MG) BY MOUTH TWICE DAILY 60 tablet 3  . empagliflozin (JARDIANCE) 10 MG TABS tablet Take 10 mg by mouth daily.    . metoprolol succinate (TOPROL-XL) 50 MG 24 hr tablet TAKE 1 TABLET BY MOUTH DAILY 30 tablet 3  . potassium chloride SA (K-DUR,KLOR-CON) 20 MEQ tablet Take 1 tablet (20 mEq total) by mouth daily.  30 tablet 3  . sacubitril-valsartan (ENTRESTO) 24-26 MG Take 1 tablet by mouth 2 (two) times daily. 60 tablet 3  . torsemide (DEMADEX) 20 MG tablet Take 10 mg by mouth daily.    . vitamin C (ASCORBIC ACID) 250 MG tablet Take 250 mg by mouth daily.    Marland Kitchen dexlansoprazole (DEXILANT) 60 MG capsule Take 60 mg by mouth daily.     No current facility-administered medications for this encounter.    Allergies  Allergen Reactions  . Codeine Other (See Comments)    "Gives me headaches"  . Tape     Adhesive tape caused bruising; use paper tape only.   Social History   Socioeconomic History  . Marital status: Married    Spouse name: Not on file  . Number of children: Not on file  . Years of education: Not on file  . Highest education level: Not on file  Occupational History  . Not on file  Social Needs  . Financial resource strain: Not on file  . Food insecurity:    Worry: Not on file    Inability: Not on file  . Transportation needs:    Medical: Not on file    Non-medical: Not on file  Tobacco Use  . Smoking status: Never Smoker  . Smokeless tobacco: Never Used  Substance and Sexual Activity  . Alcohol use: Yes    Comment: rare glass of wine  . Drug use: No  . Sexual activity: Not on file  Lifestyle  . Physical activity:    Days per week: Not on file    Minutes per session: Not on file  . Stress: Not on file  Relationships  . Social connections:    Talks on phone: Not on file    Gets together: Not on file    Attends religious service: Not on file    Active member of club or organization: Not on file    Attends meetings of clubs or organizations: Not on file    Relationship status: Not on file  . Intimate partner violence:    Fear of current or ex partner: Not on file    Emotionally abused: Not on file    Physically abused: Not on file    Forced sexual activity: Not on file  Other Topics Concern  . Not on file  Social History Narrative  . Not on file     Family  History  Problem Relation Age of Onset  . Tuberculosis Mother   . Stroke Father   . Congestive Heart Failure Father     Vitals:   02/07/18 1303  BP: (!) 153/103  Pulse: 64  SpO2: 96%  Weight: 160 lb 12.8 oz (72.9 kg)  Wt Readings from Last 3 Encounters:  02/07/18 160 lb 12.8 oz (72.9 kg)  10/12/17 161 lb 8 oz (73.3 kg)  07/13/17 158 lb 8 oz (71.9 kg)    PHYSICAL EXAM: General:  Well appearing. No resp difficulty HEENT: normal Neck: supple. no JVD. Carotids 2+ bilat; no bruits. No lymphadenopathy or thryomegaly appreciated. Cor: PMI nondisplaced. Regular rate & rhythm. 2/6 AS Lungs: clear Abdomen: soft, nontender, nondistended. No hepatosplenomegaly. No bruits or masses. Good bowel sounds. Extremities: no cyanosis, clubbing, rash, edema Neuro: alert & orientedx3, cranial nerves grossly intact. moves all 4 extremities w/o difficulty. Affect pleasant    ECG: AF 68 +PVC. Personally reviewed   ASSESSMENT & PLAN: 1. Chronic systolic HF - Echo 3/41/93 EF 40-45% with moderate RV dysfunction. Seems out of proportion to CAD. ? PVC related with 10% PVCs on Holter 9/18. - SPEP negative. CMRI no infiltrative process EF 43% - Doing very well.  NYHA I. Volume status stable on torsemide 10 mg daily - Will stop digoxin  - Continue entresto to 49-51 mg twice a day. Gets dizzy at times so will not titrate - Unable to tolerate spiro due to severe gynecomastia - Check BMET today  2. Chronic AF  - Previously with RVR. Improved with digoxin 0.0625 daily and increased Torpol at 50 daily. - HR now in 60s. Will stop digoxin and follow. Can restart as needed - CHADSVASC = 5 (age, DM, HTN, HF) - Continue Eliquis. Minor epistaxis at times. Has had cauterization in past.   3. Mild aortic stenosis - Moderately calcified on Echo 11/06/16. - Mean gradient 9 mm Hg, Peak gradient 15 mm Hg - Repeat echo in 6 months  4. HTN  - Blood pressure high here but well controlled to low at home. Continue  current regimen.  5. DM2 - Per PCP. Now on Jardiance  6. Suspected sleep apnea.  - Sleep study was negative.    7. H/o LGIB - Follows with Dr. Collene Mares. No clear reason for colonoscopy unless he rebleeds.   8. CAD - He has highly calcified coronary arteries with borderline lesion in midLAD.  - No signs or symptoms of ischemia. Continue medical therapy   Glori Bickers, MD  1:14 PM

## 2018-03-04 ENCOUNTER — Other Ambulatory Visit (HOSPITAL_COMMUNITY): Payer: Self-pay | Admitting: Internal Medicine

## 2018-03-15 DIAGNOSIS — K5730 Diverticulosis of large intestine without perforation or abscess without bleeding: Secondary | ICD-10-CM | POA: Diagnosis not present

## 2018-03-15 DIAGNOSIS — K219 Gastro-esophageal reflux disease without esophagitis: Secondary | ICD-10-CM | POA: Diagnosis not present

## 2018-03-19 DIAGNOSIS — D696 Thrombocytopenia, unspecified: Secondary | ICD-10-CM | POA: Diagnosis not present

## 2018-03-19 DIAGNOSIS — I10 Essential (primary) hypertension: Secondary | ICD-10-CM | POA: Diagnosis not present

## 2018-03-19 DIAGNOSIS — N183 Chronic kidney disease, stage 3 (moderate): Secondary | ICD-10-CM | POA: Diagnosis not present

## 2018-03-19 DIAGNOSIS — J019 Acute sinusitis, unspecified: Secondary | ICD-10-CM | POA: Diagnosis not present

## 2018-03-19 DIAGNOSIS — E119 Type 2 diabetes mellitus without complications: Secondary | ICD-10-CM | POA: Diagnosis not present

## 2018-03-19 DIAGNOSIS — I480 Paroxysmal atrial fibrillation: Secondary | ICD-10-CM | POA: Diagnosis not present

## 2018-03-19 DIAGNOSIS — I5022 Chronic systolic (congestive) heart failure: Secondary | ICD-10-CM | POA: Diagnosis not present

## 2018-03-28 DIAGNOSIS — H11122 Conjunctival concretions, left eye: Secondary | ICD-10-CM | POA: Diagnosis not present

## 2018-03-28 DIAGNOSIS — H01024 Squamous blepharitis left upper eyelid: Secondary | ICD-10-CM | POA: Diagnosis not present

## 2018-03-29 ENCOUNTER — Other Ambulatory Visit (HOSPITAL_COMMUNITY): Payer: Self-pay | Admitting: Internal Medicine

## 2018-05-01 ENCOUNTER — Other Ambulatory Visit: Payer: Self-pay | Admitting: Gastroenterology

## 2018-05-01 DIAGNOSIS — R140 Abdominal distension (gaseous): Secondary | ICD-10-CM | POA: Diagnosis not present

## 2018-05-01 DIAGNOSIS — R1013 Epigastric pain: Secondary | ICD-10-CM | POA: Diagnosis not present

## 2018-05-01 DIAGNOSIS — K862 Cyst of pancreas: Secondary | ICD-10-CM

## 2018-05-01 DIAGNOSIS — K219 Gastro-esophageal reflux disease without esophagitis: Secondary | ICD-10-CM | POA: Diagnosis not present

## 2018-05-01 DIAGNOSIS — R1033 Periumbilical pain: Secondary | ICD-10-CM | POA: Diagnosis not present

## 2018-05-10 ENCOUNTER — Ambulatory Visit (HOSPITAL_COMMUNITY): Payer: Medicare Other

## 2018-05-11 DIAGNOSIS — Z8739 Personal history of other diseases of the musculoskeletal system and connective tissue: Secondary | ICD-10-CM | POA: Diagnosis not present

## 2018-05-11 DIAGNOSIS — Z23 Encounter for immunization: Secondary | ICD-10-CM | POA: Diagnosis not present

## 2018-05-11 DIAGNOSIS — I5022 Chronic systolic (congestive) heart failure: Secondary | ICD-10-CM | POA: Diagnosis not present

## 2018-05-11 DIAGNOSIS — E782 Mixed hyperlipidemia: Secondary | ICD-10-CM | POA: Diagnosis not present

## 2018-05-11 DIAGNOSIS — I10 Essential (primary) hypertension: Secondary | ICD-10-CM | POA: Diagnosis not present

## 2018-05-11 DIAGNOSIS — D72829 Elevated white blood cell count, unspecified: Secondary | ICD-10-CM | POA: Diagnosis not present

## 2018-05-11 DIAGNOSIS — Q211 Atrial septal defect: Secondary | ICD-10-CM | POA: Diagnosis not present

## 2018-05-11 DIAGNOSIS — E1165 Type 2 diabetes mellitus with hyperglycemia: Secondary | ICD-10-CM | POA: Diagnosis not present

## 2018-05-11 DIAGNOSIS — N183 Chronic kidney disease, stage 3 (moderate): Secondary | ICD-10-CM | POA: Diagnosis not present

## 2018-05-11 DIAGNOSIS — I2510 Atherosclerotic heart disease of native coronary artery without angina pectoris: Secondary | ICD-10-CM | POA: Diagnosis not present

## 2018-05-23 ENCOUNTER — Ambulatory Visit (HOSPITAL_COMMUNITY): Payer: Medicare Other

## 2018-05-25 ENCOUNTER — Other Ambulatory Visit: Payer: Self-pay | Admitting: Gastroenterology

## 2018-05-25 ENCOUNTER — Ambulatory Visit (HOSPITAL_COMMUNITY)
Admission: RE | Admit: 2018-05-25 | Discharge: 2018-05-25 | Disposition: A | Discharge status: Home / Self Care | Payer: Medicare Other | Source: Ambulatory Visit | Attending: Gastroenterology | Admitting: Gastroenterology

## 2018-05-25 DIAGNOSIS — K862 Cyst of pancreas: Secondary | ICD-10-CM | POA: Diagnosis not present

## 2018-05-25 DIAGNOSIS — R935 Abnormal findings on diagnostic imaging of other abdominal regions, including retroperitoneum: Secondary | ICD-10-CM | POA: Diagnosis not present

## 2018-05-25 DIAGNOSIS — I700 Atherosclerosis of aorta: Secondary | ICD-10-CM | POA: Diagnosis not present

## 2018-05-25 DIAGNOSIS — N281 Cyst of kidney, acquired: Secondary | ICD-10-CM | POA: Diagnosis not present

## 2018-05-25 MED ORDER — GADOBUTROL 1 MMOL/ML IV SOLN
7.5000 mL | Freq: Once | INTRAVENOUS | Status: AC | PRN
  Administered 2018-05-25: 7 mL via INTRAVENOUS

## 2018-06-03 ENCOUNTER — Other Ambulatory Visit (HOSPITAL_COMMUNITY): Payer: Self-pay | Admitting: Internal Medicine

## 2018-06-27 DIAGNOSIS — Z961 Presence of intraocular lens: Secondary | ICD-10-CM | POA: Diagnosis not present

## 2018-06-30 ENCOUNTER — Other Ambulatory Visit (HOSPITAL_COMMUNITY): Payer: Self-pay | Admitting: Internal Medicine

## 2018-07-29 ENCOUNTER — Other Ambulatory Visit (HOSPITAL_COMMUNITY): Payer: Self-pay | Admitting: Internal Medicine

## 2018-07-30 ENCOUNTER — Other Ambulatory Visit (HOSPITAL_COMMUNITY): Payer: Self-pay | Admitting: Internal Medicine

## 2018-08-10 DIAGNOSIS — Z23 Encounter for immunization: Secondary | ICD-10-CM | POA: Diagnosis not present

## 2018-08-28 ENCOUNTER — Other Ambulatory Visit (HOSPITAL_COMMUNITY): Payer: Self-pay | Admitting: Internal Medicine

## 2018-08-30 ENCOUNTER — Telehealth (HOSPITAL_COMMUNITY): Payer: Self-pay

## 2018-08-30 NOTE — Telephone Encounter (Signed)
Duplicate order for eliquis in Epic, corrected. Per [facility], confirmed for only one order in their system.

## 2018-09-17 ENCOUNTER — Other Ambulatory Visit (HOSPITAL_COMMUNITY): Payer: Self-pay | Admitting: Internal Medicine

## 2018-09-29 ENCOUNTER — Other Ambulatory Visit (HOSPITAL_COMMUNITY): Payer: Self-pay | Admitting: Internal Medicine

## 2018-10-10 DIAGNOSIS — E1165 Type 2 diabetes mellitus with hyperglycemia: Secondary | ICD-10-CM | POA: Diagnosis not present

## 2018-10-19 ENCOUNTER — Encounter (HOSPITAL_BASED_OUTPATIENT_CLINIC_OR_DEPARTMENT_OTHER): Payer: Self-pay | Admitting: Emergency Medicine

## 2018-10-19 ENCOUNTER — Emergency Department (HOSPITAL_BASED_OUTPATIENT_CLINIC_OR_DEPARTMENT_OTHER): Payer: Medicare Other

## 2018-10-19 ENCOUNTER — Other Ambulatory Visit: Payer: Self-pay

## 2018-10-19 ENCOUNTER — Emergency Department (HOSPITAL_BASED_OUTPATIENT_CLINIC_OR_DEPARTMENT_OTHER)
Admission: EM | Admit: 2018-10-19 | Discharge: 2018-10-19 | Disposition: A | Discharge status: Home / Self Care | Payer: Medicare Other | Attending: Emergency Medicine | Admitting: Emergency Medicine

## 2018-10-19 DIAGNOSIS — E119 Type 2 diabetes mellitus without complications: Secondary | ICD-10-CM | POA: Insufficient documentation

## 2018-10-19 DIAGNOSIS — W01198A Fall on same level from slipping, tripping and stumbling with subsequent striking against other object, initial encounter: Secondary | ICD-10-CM | POA: Insufficient documentation

## 2018-10-19 DIAGNOSIS — S0083XA Contusion of other part of head, initial encounter: Secondary | ICD-10-CM | POA: Insufficient documentation

## 2018-10-19 DIAGNOSIS — I5022 Chronic systolic (congestive) heart failure: Secondary | ICD-10-CM | POA: Insufficient documentation

## 2018-10-19 DIAGNOSIS — Z23 Encounter for immunization: Secondary | ICD-10-CM | POA: Insufficient documentation

## 2018-10-19 DIAGNOSIS — Z79899 Other long term (current) drug therapy: Secondary | ICD-10-CM | POA: Diagnosis not present

## 2018-10-19 DIAGNOSIS — S0081XA Abrasion of other part of head, initial encounter: Secondary | ICD-10-CM | POA: Diagnosis not present

## 2018-10-19 DIAGNOSIS — I110 Hypertensive heart disease with heart failure: Secondary | ICD-10-CM | POA: Diagnosis not present

## 2018-10-19 DIAGNOSIS — S0990XA Unspecified injury of head, initial encounter: Secondary | ICD-10-CM | POA: Diagnosis present

## 2018-10-19 DIAGNOSIS — Z7901 Long term (current) use of anticoagulants: Secondary | ICD-10-CM | POA: Insufficient documentation

## 2018-10-19 DIAGNOSIS — Y9389 Activity, other specified: Secondary | ICD-10-CM | POA: Diagnosis not present

## 2018-10-19 DIAGNOSIS — W19XXXA Unspecified fall, initial encounter: Secondary | ICD-10-CM

## 2018-10-19 DIAGNOSIS — Y998 Other external cause status: Secondary | ICD-10-CM | POA: Diagnosis not present

## 2018-10-19 DIAGNOSIS — Y9289 Other specified places as the place of occurrence of the external cause: Secondary | ICD-10-CM | POA: Diagnosis not present

## 2018-10-19 DIAGNOSIS — Z9049 Acquired absence of other specified parts of digestive tract: Secondary | ICD-10-CM | POA: Diagnosis not present

## 2018-10-19 MED ORDER — TETANUS-DIPHTH-ACELL PERTUSSIS 5-2.5-18.5 LF-MCG/0.5 IM SUSP
0.5000 mL | Freq: Once | INTRAMUSCULAR | Status: AC
  Administered 2018-10-19: 0.5 mL via INTRAMUSCULAR
  Filled 2018-10-19: qty 0.5

## 2018-10-19 NOTE — ED Triage Notes (Signed)
Patient states that about 2 hours ago he fell and hit multiple body parts, and his head. Patient is on blood thinners. Patient denies any Mental Status changes or headache at this time.

## 2018-10-19 NOTE — ED Provider Notes (Signed)
[facility] EMERGENCY DEPARTMENT Provider Note   CSN: [id number] Arrival date & time: 10/19/18  1743    History   Chief Complaint Chief Complaint  Patient presents with  . Fall    HPI Ricardo [name] is a 83 y.o. male.     The history is provided by the patient.  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  His foot got hooked under the ladder causing him to fall forward and hit his leg and his forehead on the floor) problem. The current episode started 1 to 2 hours ago. The problem occurs constantly. The problem has not changed since onset.Associated symptoms comments: Pain and bruising to the right leg and right arm.  Swelling and abrasion to the right forehead.  No neck pain or LOC.  Patient does take anticoagulation.  He has had no difficulty walking since the accident.  No vision changes or mental status changes.  He states he otherwise feels fine.. Nothing aggravates the symptoms. Nothing relieves the symptoms. He has tried nothing for the symptoms.    Past Medical History:  Diagnosis Date  . Atrial fibrillation (HCC)    on coumadin.  cardiologist is in [facility] dr [name].  S/P multiple DCCVs and prior ablation.   Marland Kitchen BPH (benign prostatic hyperplasia)    by ST in 2000.   Marland Kitchen CHF (congestive heart failure) ([place])   . Complication of anesthesia    " sometimes slow to wake up"  . Diabetes mellitus without complication ([place])   . Diverticulitis    question if he ever had imaging confirmed diverticulitis  . GERD (gastroesophageal reflux disease)   . GI bleed   . Gout   . Hypertension   . Kidney stones    treated with lithotripsy   . Pneumonia   . PONV (postoperative nausea and vomiting)   . Rupture of biceps tendon 2006   left distal bicep tendon  . UTI (urinary tract infection)     Patient Active Problem List   Diagnosis Date Noted  . Hypertension 02/07/2017  . Chronic systolic congestive heart  failure ([place]) 11/19/2016  . Orthopnea 11/05/2016  . Edema 11/05/2016  . Diverticulosis 04/18/2012  . Chronic anticoagulation 04/17/2012  . Atrial fibrillation ([place])   . Diverticulitis   . Kidney stones   . BPH (benign prostatic hyperplasia)     Past Surgical History:  Procedure Laterality Date  . CARDIOVERSION    . CATARACT EXTRACTION, BILATERAL    . CHOLECYSTECTOMY    . COLONOSCOPY  04/18/2012   Procedure: COLONOSCOPY;  Surgeon: [name], MD;  Location: [facility];  Service: Endoscopy;  Laterality: N/A;  . DISTAL BICEPS TENDON REPAIR  2006   dr [name]  . HEMORRHOID SURGERY     twice  . HERNIA REPAIR    . LITHOTRIPSY     of kidney stones.   Marland Kitchen PALATE / UVULA BIOPSY / EXCISION     uvula excision  . RIGHT/LEFT HEART CATH AND CORONARY ANGIOGRAPHY N/A 11/15/2016   Procedure: Right/Left Heart Cath and Coronary Angiography;  Surgeon: [name], MD;  Location: [facility];  Service: Cardiovascular;  Laterality: N/A;  . thyroid nodule excision          Home Medications    Prior to Admission medications   Medication Sig Start Date End Date Taking? Authorizing Provider  b complex vitamins tablet Take 1 tablet by mouth  daily.    [provider]  Cinnamon 500 MG capsule Take 1,000 mg by mouth daily.    [provider]  Coenzyme Q10 (CO Q10) 200 MG CAPS Take 200 mcg by mouth daily.    [provider]  cyanocobalamin 100 MCG tablet Take 1,000 mcg by mouth daily.    [provider]  dexlansoprazole (DEXILANT) 60 MG capsule Take 60 mg by mouth daily.    [provider]  digoxin (DIGOX) 0.125 MG tablet Take 0.5 tablets (0.0625 mg total) by mouth daily. 12/05/17   Bensimhon, Shaune Pascal, MD  ELIQUIS 2.5 MG TABS tablet TAKE 1 TABLET(2.5 MG) BY MOUTH TWICE DAILY 10/01/18   Bensimhon, Shaune Pascal, MD  empagliflozin (JARDIANCE) 10 MG TABS tablet Take 10 mg by mouth daily.    [provider]  ENTRESTO 24-26 MG TAKE 1 TABLET BY  MOUTH TWICE DAILY 06/04/18   Bensimhon, Shaune Pascal, MD  metoprolol succinate (TOPROL-XL) 50 MG 24 hr tablet TAKE 1 TABLET BY MOUTH DAILY 09/17/18   Bensimhon, Shaune Pascal, MD  potassium chloride SA (K-DUR,KLOR-CON) 20 MEQ tablet TAKE 1 TABLET(20 MEQ) BY MOUTH DAILY 10/01/18   Bensimhon, Shaune Pascal, MD  torsemide (DEMADEX) 20 MG tablet Take 10 mg by mouth daily.    [provider]  vitamin C (ASCORBIC ACID) 250 MG tablet Take 250 mg by mouth daily.    [provider]    Family History Family History  Problem Relation Age of Onset  . Tuberculosis Mother   . Stroke Father   . Congestive Heart Failure Father     Social History Social History   Tobacco Use  . Smoking status: Never Smoker  . Smokeless tobacco: Never Used  Substance Use Topics  . Alcohol use: Yes    Comment: rare glass of wine  . Drug use: No     Allergies   Codeine and Tape   Review of Systems Review of Systems  All other systems reviewed and are negative.    Physical Exam Updated Vital Signs BP (!) 161/101 (BP Location: Left Arm)   Pulse 90   Temp 97.8 F (36.6 C) (Oral)   Resp 18   Ht 5\' 6"  (1.676 m)   Wt 68 kg   SpO2 100%   BMI 24.21 kg/m   Physical Exam Vitals signs and nursing note reviewed.  Constitutional:      General: He is not in acute distress.    Appearance: Normal appearance. He is well-developed and normal weight.  HENT:     Head: Normocephalic. Abrasion and contusion present.      Nose: Nose normal.     Mouth/Throat:     Mouth: Mucous membranes are moist.  Eyes:     Conjunctiva/sclera: Conjunctivae normal.     Pupils: Pupils are equal, round, and reactive to light.  Neck:     Musculoskeletal: Normal range of motion and neck supple. No muscular tenderness.  Cardiovascular:     Rate and Rhythm: Normal rate.  Pulmonary:     Effort: Pulmonary effort is normal.  Musculoskeletal: Normal range of motion.        General: Signs of injury present. No tenderness.        Arms:       Legs:  Skin:    General: Skin is warm and dry.     Findings: No erythema or rash.  Neurological:     General: No focal deficit present.     Mental Status: He is  alert and oriented to person, place, and time. Mental status is at baseline.  Psychiatric:        Mood and Affect: Mood normal.        Behavior: Behavior normal.        Thought Content: Thought content normal.      ED Treatments / Results  Labs (all labs ordered are listed, but only abnormal results are displayed) Labs Reviewed - No data to display  EKG None  Radiology Ct Head Wo Contrast  Result Date: 10/19/2018 CLINICAL DATA:  [place] with swelling to the right side of head EXAM: CT HEAD WITHOUT CONTRAST TECHNIQUE: Contiguous axial images were obtained from the base of the skull through the vertex without intravenous contrast. COMPARISON:  None. FINDINGS: Brain: No acute territorial infarction, hemorrhage or intracranial mass. Moderate atrophy. Mild small vessel ischemic changes of the white matter. Nonenlarged ventricles. Vascular: No hyperdense vessels.  Carotid vascular calcification Skull: No fracture Sinuses/Orbits: Mucosal thickening in the right maxillary sinus. Hyperdense secretion in the right maxillary sinus. Other: Small right forehead hematoma IMPRESSION: 1. No CT evidence for acute intracranial abnormality. 2. Atrophy and small vessel ischemic changes of the white matter Electronically Signed   By: Donavan Foil M.D.   On: 10/19/2018 18:54    Procedures Procedures (including critical care time)  Medications Ordered in ED Medications  Tdap (BOOSTRIX) injection 0.5 mL (has no administration in time range)     Initial Impression / Assessment and Plan / ED Course  I have reviewed the triage vital signs and the nursing notes.  Pertinent labs & imaging results that were available during my care of the patient were reviewed by me and considered in my medical decision making (see chart for  details).       Patient presenting after a fall at a warehouse because he tripped over a ladder.  He had no loss of consciousness but did hit his head and he is on Eliquis.  He also has superficial injury to the right leg and arm with some skin tears and hematomas.  He is able to ambulate without difficulty and low suspicion for bony injury.  He has no neck pain and neurologic exam is normal.  He denies any changes since the fall.  However given he is on anticoagulation will scan his head to ensure no bleeding.  Tetanus shot was updated  7:14 PM Imaging wnl and pt d/ce dhome.  Final Clinical Impressions(s) / ED Diagnoses   Final diagnoses:  Fall, initial encounter  Contusion of face, initial encounter    ED Discharge Orders    None       Blanchie Dessert, MD 10/19/18 [phone]

## 2018-10-27 ENCOUNTER — Other Ambulatory Visit (HOSPITAL_COMMUNITY): Payer: Self-pay | Admitting: Internal Medicine

## 2018-10-30 ENCOUNTER — Other Ambulatory Visit (HOSPITAL_COMMUNITY): Payer: Self-pay

## 2018-10-30 MED ORDER — POTASSIUM CHLORIDE CRYS ER 20 MEQ PO TBCR
EXTENDED_RELEASE_TABLET | ORAL | 0 refills | Status: DC
Start: 2018-10-30 — End: 2018-11-26

## 2018-10-30 MED ORDER — APIXABAN 2.5 MG PO TABS: ORAL_TABLET | ORAL | 0 refills | Status: DC

## 2018-11-07 ENCOUNTER — Other Ambulatory Visit (HOSPITAL_COMMUNITY): Payer: Self-pay | Admitting: *Deleted

## 2018-11-07 ENCOUNTER — Telehealth (HOSPITAL_COMMUNITY): Payer: Self-pay | Admitting: Vascular Surgery

## 2018-11-07 MED ORDER — SACUBITRIL-VALSARTAN 24-26 MG PO TABS: 1.0000 | ORAL_TABLET | Freq: Two times a day (BID) | ORAL | 5 refills | Status: DC

## 2018-11-07 NOTE — Telephone Encounter (Signed)
Spoke to pt to resch 4/2 appt w/ db, pt is doing fine he resch for 6/3 , he needs a refill on his [employer]

## 2018-11-15 ENCOUNTER — Encounter (HOSPITAL_COMMUNITY): Payer: Medicare Other | Admitting: Internal Medicine

## 2018-11-25 ENCOUNTER — Other Ambulatory Visit (HOSPITAL_COMMUNITY): Payer: Self-pay | Admitting: Internal Medicine

## 2018-12-16 ENCOUNTER — Other Ambulatory Visit (HOSPITAL_COMMUNITY): Payer: Self-pay | Admitting: Internal Medicine

## 2018-12-22 ENCOUNTER — Other Ambulatory Visit (HOSPITAL_COMMUNITY): Payer: Self-pay | Admitting: Internal Medicine

## 2018-12-24 ENCOUNTER — Other Ambulatory Visit (HOSPITAL_COMMUNITY): Payer: Self-pay

## 2018-12-24 MED ORDER — SACUBITRIL-VALSARTAN 24-26 MG PO TABS: 1.0000 | ORAL_TABLET | Freq: Two times a day (BID) | ORAL | 2 refills | Status: DC

## 2018-12-24 NOTE — Telephone Encounter (Signed)
Received request from [facility] for 90 say Entresto script. Per patient, they are with [facility] and would [facility]. 90 day supply sent.

## 2019-01-16 ENCOUNTER — Telehealth (HOSPITAL_COMMUNITY): Payer: Medicare Other | Admitting: Internal Medicine

## 2019-01-19 ENCOUNTER — Other Ambulatory Visit (HOSPITAL_COMMUNITY): Payer: Self-pay | Admitting: Internal Medicine

## 2019-01-21 ENCOUNTER — Other Ambulatory Visit: Payer: Self-pay

## 2019-01-21 ENCOUNTER — Encounter (HOSPITAL_COMMUNITY): Payer: Self-pay

## 2019-01-21 ENCOUNTER — Ambulatory Visit (HOSPITAL_COMMUNITY)
Admission: RE | Admit: 2019-01-21 | Discharge: 2019-01-21 | Disposition: A | Discharge status: Home / Self Care | Payer: Medicare Other | Source: Ambulatory Visit | Attending: Internal Medicine | Admitting: Internal Medicine

## 2019-01-21 DIAGNOSIS — I5022 Chronic systolic (congestive) heart failure: Secondary | ICD-10-CM | POA: Diagnosis not present

## 2019-01-21 DIAGNOSIS — I4821 Permanent atrial fibrillation: Secondary | ICD-10-CM

## 2019-01-21 DIAGNOSIS — I2510 Atherosclerotic heart disease of native coronary artery without angina pectoris: Secondary | ICD-10-CM | POA: Diagnosis not present

## 2019-01-21 DIAGNOSIS — I350 Nonrheumatic aortic (valve) stenosis: Secondary | ICD-10-CM

## 2019-01-21 NOTE — Addendum Note (Signed)
Encounter addended by: Jovita Kussmaul, RN on: 01/21/2019 3:16 PM  Actions taken: Diagnosis association updated, Order list changed

## 2019-01-21 NOTE — Progress Notes (Signed)
Heart Failure TeleHealth Note  Due to national recommendations of social distancing due to [place] 19, Audio/video telehealth visit is felt to be most appropriate for this patient at this time.  See MyChart message from today for patient consent regarding telehealth for Ricardo [name].  Date:  01/21/2019   ID:  Ricardo [name], DOB September 04, 1935, MRN [id number]  Location: Home  Provider location: [facility] Type of Visit: Established patient  PCP:  Audley Hose, MD  Cardiologist:  No primary care provider on file. Primary HF: Bensimhon  Chief Complaint: Heart Failure follow-up   History of Present Illness:  Ricardo [name] is a 83 y.o. male  with h/o HTN, diabetes, CAD and chronic atrial fibrillation.  Has had AF for years. Followed by Dr. Ola Spurr at [facility]. He is s/p previous AFL ablation. Echo in 12/13 EF 40-45%.. Had sleep study in past and placed on CPAP. He was subsequently told OSA was mild and CPAP stopped.   In January 2018 began to develop HF symptoms. AF rate elevated. Echo 40-45% with moderate RV dysfunction. Mild AS. Diuresed 9 pounds and placed on metoprolol and digoxin for HR control. Weight on d/c 145. Sent home on lasix 40 daily.  Cath 4/18 Very heavily calcified coronary arteries with high grade lesion in moderate-sized OM1 and tandem 70% and 80% lesions in mid LAD. Distal PDA 90%. Treated medically.   He presents via Engineer, civil (consulting) for a telehealth visit today.  Says he is doing fine. Still riding exercise bike. Doing about 3 miles at a time - 2x/week. Also walks frequently on his property. No CP, SOB, orthopnea or PND. Not taking HR or BP regularly but says they have been good when ever he goes to see any providers. Says HR is never fast. No problems with Eliquis. Very rare nose bleed.  cMRI 1/19  1) Mild LVE with moderate LVH Diffuse hypokinesis worse in the inferolateral wall. Quantitative EF 43% 2) SEMI seen in  basal anterolateral and inferolateral walls as well as mid inferolateral wall 3) Severe LAE Moderate RAE 4) PFO present 5) Moderate appearing MR 6) Tri- leaflet AV with some thickening and restricted motion suggest echo correlation  Echo 11/06/16 LVEF 40%, Mild/Mod MR, Severe LAE, Severe RAE, + Patent foramen ovale  Cath 11/15/16 Very heavily calcified coronary arteries with high grade lesion in moderate-sized OM1 and tandem 70% and 80% lesions in mid LAD. Distal PDA 90%. Treated medically.   RA = 6 RV = 48/6 PA = 48/19 (29) PCW = 17 Ao = 135/89 (108) LV = 147/14 Fick cardiac output/index = 3.8/2.2 PVR = 3.2 SVR = 2131 FA sat = 98% PA sat = 67%. 69%   Ricardo [name] denies symptoms worrisome for COVID 19.   Past Medical History:  Diagnosis Date  . Atrial fibrillation (HCC)    on coumadin.  cardiologist is in [name] dr [name].  S/P multiple DCCVs and prior ablation.   Marland Kitchen BPH (benign prostatic hyperplasia)    by ST in 2000.   Marland Kitchen CHF (congestive heart failure) ([place])   . Complication of anesthesia    " sometimes slow to wake up"  . Diabetes mellitus without complication ([place])   . Diverticulitis    question if he ever had imaging confirmed diverticulitis  . GERD (gastroesophageal reflux disease)   . GI bleed   . Gout   . Hypertension   . Kidney stones    treated with lithotripsy   .  Pneumonia   . PONV (postoperative nausea and vomiting)   . Rupture of biceps tendon 2006   left distal bicep tendon  . UTI (urinary tract infection)    Past Surgical History:  Procedure Laterality Date  . CARDIOVERSION    . CATARACT EXTRACTION, BILATERAL    . CHOLECYSTECTOMY    . COLONOSCOPY  04/18/2012   Procedure: COLONOSCOPY;  Surgeon: [name], MD;  Location: [facility];  Service: Endoscopy;  Laterality: N/A;  . DISTAL BICEPS TENDON REPAIR  2006   dr [name]  . HEMORRHOID SURGERY     twice  . HERNIA REPAIR    . LITHOTRIPSY     of kidney stones.   Marland Kitchen  PALATE / UVULA BIOPSY / EXCISION     uvula excision  . RIGHT/LEFT HEART CATH AND CORONARY ANGIOGRAPHY N/A 11/15/2016   Procedure: Right/Left Heart Cath and Coronary Angiography;  Surgeon: [name], MD;  Location: [facility];  Service: Cardiovascular;  Laterality: N/A;  . thyroid nodule excision       Current Outpatient Medications  Medication Sig Dispense Refill  . b complex vitamins tablet Take 1 tablet by mouth daily.    . Cinnamon 500 MG capsule Take 1,000 mg by mouth daily.    . Coenzyme Q10 (CO Q10) 200 MG CAPS Take 200 mcg by mouth daily.    . cyanocobalamin 100 MCG tablet Take 1,000 mcg by mouth daily.    Marland Kitchen dexlansoprazole (DEXILANT) 60 MG capsule Take 60 mg by mouth daily.    . digoxin (DIGOX) 0.125 MG tablet Take 0.5 tablets (0.0625 mg total) by mouth daily. 15 tablet 3  . ELIQUIS 2.5 MG TABS tablet TAKE 1 TABLET(2.5 MG) BY MOUTH TWICE DAILY 60 tablet 0  . empagliflozin (JARDIANCE) 10 MG TABS tablet Take 10 mg by mouth daily.    . metoprolol succinate (TOPROL-XL) 50 MG 24 hr tablet TAKE 1 TABLET BY MOUTH DAILY 90 tablet 0  . potassium chloride SA (K-DUR,KLOR-CON) 20 MEQ tablet TAKE 1 TABLET(20 MEQ) BY MOUTH DAILY 30 tablet 3  . sacubitril-valsartan (ENTRESTO) 24-26 MG Take 1 tablet by mouth 2 (two) times daily. 180 tablet 2  . torsemide (DEMADEX) 20 MG tablet Take 10 mg by mouth daily.    . vitamin C (ASCORBIC ACID) 250 MG tablet Take 250 mg by mouth daily.     No current facility-administered medications for this encounter.     Allergies:   Codeine and Tape   Social History:  The patient  reports that he has never smoked. He has never used smokeless tobacco. He reports current alcohol use. He reports that he does not use drugs.   Family History:  The patient's family history includes Congestive Heart Failure in his father; Stroke in his father; Tuberculosis in his mother.   ROS:  Please see the history of present illness.   All other systems are personally  reviewed and negative.   Exam:  (Video/Tele Health Call; Exam is subjective and or/visual.) General:  Speaks in full sentences. No resp difficulty. Lungs: Normal respiratory effort with conversation.  Abdomen: Non-distended per patient report Extremities: Pt denies edema. Neuro: Alert & oriented x 3.   Recent Labs: 02/07/2018: B Natriuretic Peptide 522.4; BUN 20; Creatinine, Ser 1.45; Hemoglobin 17.8; Platelets 177; Potassium 4.1; Sodium 143  Personally reviewed   Wt Readings from Last 3 Encounters:  10/19/18 68 kg (150 lb)  02/07/18 72.9 kg (160 lb 12.8 oz)  10/12/17 73.3 kg (161 lb 8  oz)      ASSESSMENT AND PLAN:  1. Chronic systolic HF - Echo 7/67/34 EF 40-45% with moderate RV dysfunction. Seems out of proportion to CAD. ? PVC related with 10% PVCs on Holter 9/18. - SPEP negative. CMRI 2019 no infiltrative process EF 43% - Doing very well.  NYHA I. Volume status stable on torsemide 10 mg daily - Continue Toprol 50mg  daily - Continue entresto to 49-51 mg twice a day. Have not titrated due to intermittent lightheadedness.  - Unable to tolerate spiro due to severe gynecomastia  2. Chronic AF  - Previously with RVR. Improved with digoxin 0.0625 daily and increased Torpol at 50 daily. Digoxin stopped at last visit  - HR typically in 21s - CHADSVASC = 5 (age, DM, HTN, HF) - Continue Eliquis 2.5 bid (age > 66, last creatinine 1.45). Minor epistaxis at times. Has had cauterization in past.   3. Mild aortic stenosis - Moderately calcified on Echo 11/06/16. - Mean gradient 9 mm Hg, Peak gradient 15 mm Hg - Need repeat echo   4. HTN  - Blood pressure well controlled. Continue current regimen.  5. DM2 - Per PCP. Now on Jardiance  6. Suspected sleep apnea.  - Sleep study was negative.    7. CAD - He has highly calcified coronary arteries with borderline lesion in midLAD.  - No signs or symptoms of ischemia. Continue medical therapy   COVID screen The patient does not  have any symptoms that suggest any further testing/ screening at this time.  Social distancing reinforced today.  Recommended follow-up:  As above  Relevant cardiac medications were reviewed at length with the patient today.   The patient does not have concerns regarding their medications at this time.   The following changes were made today:  As above  Today, I have spent 17 minutes with the patient with telehealth technology discussing the above issues .    Signed, Glori Bickers, MD  01/21/2019 3:01 PM  [facility] [place] [address] [phone] (office) [phone] (fax)

## 2019-01-25 DIAGNOSIS — Z2089 Contact with and (suspected) exposure to other communicable diseases: Secondary | ICD-10-CM | POA: Diagnosis not present

## 2019-01-25 DIAGNOSIS — E119 Type 2 diabetes mellitus without complications: Secondary | ICD-10-CM | POA: Diagnosis not present

## 2019-02-09 ENCOUNTER — Other Ambulatory Visit (HOSPITAL_COMMUNITY): Payer: Self-pay | Admitting: Internal Medicine

## 2019-02-17 ENCOUNTER — Other Ambulatory Visit (HOSPITAL_COMMUNITY): Payer: Self-pay | Admitting: Internal Medicine

## 2019-03-12 ENCOUNTER — Other Ambulatory Visit (HOSPITAL_COMMUNITY): Payer: Self-pay | Admitting: Internal Medicine

## 2019-03-24 ENCOUNTER — Other Ambulatory Visit (HOSPITAL_COMMUNITY): Payer: Self-pay | Admitting: Internal Medicine

## 2019-04-08 DIAGNOSIS — C4492 Squamous cell carcinoma of skin, unspecified: Secondary | ICD-10-CM

## 2019-04-08 DIAGNOSIS — D0439 Carcinoma in situ of skin of other parts of face: Secondary | ICD-10-CM | POA: Diagnosis not present

## 2019-04-08 HISTORY — DX: Squamous cell carcinoma of skin, unspecified: C44.92

## 2019-04-16 DIAGNOSIS — E119 Type 2 diabetes mellitus without complications: Secondary | ICD-10-CM | POA: Diagnosis not present

## 2019-04-16 DIAGNOSIS — Z8739 Personal history of other diseases of the musculoskeletal system and connective tissue: Secondary | ICD-10-CM | POA: Diagnosis not present

## 2019-04-16 DIAGNOSIS — I10 Essential (primary) hypertension: Secondary | ICD-10-CM | POA: Diagnosis not present

## 2019-04-16 DIAGNOSIS — I5022 Chronic systolic (congestive) heart failure: Secondary | ICD-10-CM | POA: Diagnosis not present

## 2019-04-16 DIAGNOSIS — I480 Paroxysmal atrial fibrillation: Secondary | ICD-10-CM | POA: Diagnosis not present

## 2019-04-16 DIAGNOSIS — N183 Chronic kidney disease, stage 3 (moderate): Secondary | ICD-10-CM | POA: Diagnosis not present

## 2019-04-16 DIAGNOSIS — E782 Mixed hyperlipidemia: Secondary | ICD-10-CM | POA: Diagnosis not present

## 2019-04-19 ENCOUNTER — Telehealth (HOSPITAL_COMMUNITY): Payer: Self-pay | Admitting: Cardiology

## 2019-04-19 MED ORDER — POTASSIUM CHLORIDE CRYS ER 20 MEQ PO TBCR: 40.0000 meq | EXTENDED_RELEASE_TABLET | Freq: Every day | ORAL | 3 refills | Status: DC

## 2019-04-19 NOTE — Telephone Encounter (Signed)
Abnormal labs received Labs drawn 04/17/19 K 3.3 Cr 1.4BUN 21 Na 143  Per Dr Haroldine Laws Take 40 meq potassium now then increase to 40 MEq daily. Repeat labs x 2 weeks    Pt aware and voiced understanding Reports he will have labs done at PCP and sent to our office

## 2019-04-24 ENCOUNTER — Other Ambulatory Visit (HOSPITAL_COMMUNITY): Payer: Self-pay | Admitting: Internal Medicine

## 2019-05-10 DIAGNOSIS — I10 Essential (primary) hypertension: Secondary | ICD-10-CM | POA: Diagnosis not present

## 2019-05-10 DIAGNOSIS — H8110 Benign paroxysmal vertigo, unspecified ear: Secondary | ICD-10-CM | POA: Diagnosis not present

## 2019-05-10 DIAGNOSIS — M109 Gout, unspecified: Secondary | ICD-10-CM | POA: Diagnosis not present

## 2019-05-10 DIAGNOSIS — Z0000 Encounter for general adult medical examination without abnormal findings: Secondary | ICD-10-CM | POA: Diagnosis not present

## 2019-05-10 DIAGNOSIS — S91102A Unspecified open wound of left great toe without damage to nail, initial encounter: Secondary | ICD-10-CM | POA: Diagnosis not present

## 2019-05-10 DIAGNOSIS — J310 Chronic rhinitis: Secondary | ICD-10-CM | POA: Diagnosis not present

## 2019-05-10 DIAGNOSIS — N183 Chronic kidney disease, stage 3 (moderate): Secondary | ICD-10-CM | POA: Diagnosis not present

## 2019-05-10 DIAGNOSIS — E1165 Type 2 diabetes mellitus with hyperglycemia: Secondary | ICD-10-CM | POA: Diagnosis not present

## 2019-05-10 DIAGNOSIS — E785 Hyperlipidemia, unspecified: Secondary | ICD-10-CM | POA: Diagnosis not present

## 2019-05-10 DIAGNOSIS — R5383 Other fatigue: Secondary | ICD-10-CM | POA: Diagnosis not present

## 2019-05-21 DIAGNOSIS — Z23 Encounter for immunization: Secondary | ICD-10-CM | POA: Diagnosis not present

## 2019-06-11 DIAGNOSIS — I129 Hypertensive chronic kidney disease with stage 1 through stage 4 chronic kidney disease, or unspecified chronic kidney disease: Secondary | ICD-10-CM | POA: Diagnosis not present

## 2019-06-11 DIAGNOSIS — N1831 Chronic kidney disease, stage 3a: Secondary | ICD-10-CM | POA: Diagnosis not present

## 2019-06-11 DIAGNOSIS — N2581 Secondary hyperparathyroidism of renal origin: Secondary | ICD-10-CM | POA: Diagnosis not present

## 2019-06-11 DIAGNOSIS — D631 Anemia in chronic kidney disease: Secondary | ICD-10-CM | POA: Diagnosis not present

## 2019-08-13 ENCOUNTER — Other Ambulatory Visit (HOSPITAL_COMMUNITY): Payer: Self-pay

## 2019-08-13 MED ORDER — APIXABAN 2.5 MG PO TABS: ORAL_TABLET | ORAL | 5 refills | Status: DC

## 2019-08-30 ENCOUNTER — Ambulatory Visit: Payer: Medicare Other | Attending: Internal Medicine

## 2019-08-30 DIAGNOSIS — Z23 Encounter for immunization: Secondary | ICD-10-CM | POA: Diagnosis not present

## 2019-08-30 NOTE — Progress Notes (Signed)
   Covid-19 Vaccination Clinic  Name:  Ricardo [name]    MRN: [id number] DOB: [birth year]  08/30/2019  [name] was observed post Covid-19 immunization for 30 minutes based on pre-vaccination screening without incidence. He was provided with Vaccine Information Sheet and instruction to access the V-Safe system.   [name] was instructed to call 911 with any severe reactions post vaccine: Marland Kitchen Difficulty breathing  . Swelling of your face and throat  . A fast heartbeat  . A bad rash all over your body  . Dizziness and weakness    Immunizations Administered    Name Date Dose VIS Date Route   Pfizer COVID-19 Vaccine 08/30/2019 11:39 AM 0.3 mL 07/26/2019 Intramuscular   Manufacturer: [place]   Lot: [id number]   [place]: [id number]

## 2019-09-20 ENCOUNTER — Ambulatory Visit: Payer: Medicare Other | Attending: Internal Medicine

## 2019-09-20 DIAGNOSIS — Z23 Encounter for immunization: Secondary | ICD-10-CM

## 2019-09-20 NOTE — Progress Notes (Signed)
   Covid-19 Vaccination Clinic  Name:  Ricardo [name]    MRN: [id number] DOB: 09-29-1935  09/20/2019  [name] was observed post Covid-19 immunization for 15 minutes without incidence. He was provided with Vaccine Information Sheet and instruction to access the V-Safe system.   [name] was instructed to call 911 with any severe reactions post vaccine: Marland Kitchen Difficulty breathing  . Swelling of your face and throat  . A fast heartbeat  . A bad rash all over your body  . Dizziness and weakness    Immunizations Administered    Name Date Dose VIS Date Route   Pfizer COVID-19 Vaccine 09/20/2019 12:23 PM 0.3 mL 07/26/2019 Intramuscular   Manufacturer: [place]   Lot: [id number]   [place]: [id number]

## 2019-10-02 ENCOUNTER — Other Ambulatory Visit: Payer: Self-pay | Admitting: Internal Medicine

## 2019-10-02 MED ORDER — ENTRESTO 24-26 MG PO TABS: 1.0000 | ORAL_TABLET | Freq: Two times a day (BID) | ORAL | 2 refills | Status: DC

## 2019-10-08 ENCOUNTER — Other Ambulatory Visit (HOSPITAL_COMMUNITY): Payer: Self-pay | Admitting: Internal Medicine

## 2019-10-08 DIAGNOSIS — I5022 Chronic systolic (congestive) heart failure: Secondary | ICD-10-CM | POA: Diagnosis not present

## 2019-10-08 DIAGNOSIS — D696 Thrombocytopenia, unspecified: Secondary | ICD-10-CM | POA: Diagnosis not present

## 2019-10-08 DIAGNOSIS — E559 Vitamin D deficiency, unspecified: Secondary | ICD-10-CM | POA: Diagnosis not present

## 2019-10-08 DIAGNOSIS — E785 Hyperlipidemia, unspecified: Secondary | ICD-10-CM | POA: Diagnosis not present

## 2019-10-08 DIAGNOSIS — I739 Peripheral vascular disease, unspecified: Secondary | ICD-10-CM | POA: Diagnosis not present

## 2019-10-08 DIAGNOSIS — E119 Type 2 diabetes mellitus without complications: Secondary | ICD-10-CM | POA: Diagnosis not present

## 2019-10-08 DIAGNOSIS — E1165 Type 2 diabetes mellitus with hyperglycemia: Secondary | ICD-10-CM | POA: Diagnosis not present

## 2019-10-08 DIAGNOSIS — N183 Chronic kidney disease, stage 3 unspecified: Secondary | ICD-10-CM | POA: Diagnosis not present

## 2019-10-08 DIAGNOSIS — I480 Paroxysmal atrial fibrillation: Secondary | ICD-10-CM | POA: Diagnosis not present

## 2019-10-08 DIAGNOSIS — I10 Essential (primary) hypertension: Secondary | ICD-10-CM | POA: Diagnosis not present

## 2019-10-08 DIAGNOSIS — Z23 Encounter for immunization: Secondary | ICD-10-CM | POA: Diagnosis not present

## 2019-10-08 DIAGNOSIS — E790 Hyperuricemia without signs of inflammatory arthritis and tophaceous disease: Secondary | ICD-10-CM | POA: Diagnosis not present

## 2019-10-08 DIAGNOSIS — R0989 Other specified symptoms and signs involving the circulatory and respiratory systems: Secondary | ICD-10-CM | POA: Diagnosis not present

## 2019-10-08 DIAGNOSIS — R5383 Other fatigue: Secondary | ICD-10-CM | POA: Diagnosis not present

## 2019-10-08 DIAGNOSIS — E118 Type 2 diabetes mellitus with unspecified complications: Secondary | ICD-10-CM | POA: Diagnosis not present

## 2019-10-08 DIAGNOSIS — Z0000 Encounter for general adult medical examination without abnormal findings: Secondary | ICD-10-CM | POA: Diagnosis not present

## 2019-10-08 DIAGNOSIS — N1831 Chronic kidney disease, stage 3a: Secondary | ICD-10-CM | POA: Diagnosis not present

## 2019-10-08 DIAGNOSIS — L84 Corns and callosities: Secondary | ICD-10-CM | POA: Diagnosis not present

## 2019-10-14 ENCOUNTER — Other Ambulatory Visit: Payer: Self-pay | Admitting: Internal Medicine

## 2019-10-14 DIAGNOSIS — I739 Peripheral vascular disease, unspecified: Secondary | ICD-10-CM

## 2019-10-14 IMAGING — MR MR 3D RECON AT [facility]
19 of 23 series · 19 of 23 positions shown · IV contrast (gadavist)
Comparison: CT of 05/02/2012

CLINICAL DATA: Followup of pancreatic cysts. Pancreatic
cyst/pseudocyst.

EXAM:
MRI ABDOMEN WITHOUT AND WITH CONTRAST (INCLUDING MRCP)
TECHNIQUE: Multiplanar multisequence MR imaging of the abdomen was performed
both before and after the administration of intravenous contrast.
Heavily T2-weighted images of the biliary and pancreatic ducts were
obtained, and three-dimensional MRCP images were rendered by post
processing.
CONTRAST:  7 cc Gadavist

[Series 3: T2 fat-sat · axial · 5.0mm · 0.86mm/px · 1 of 54 slices shown]
[im 1/54]
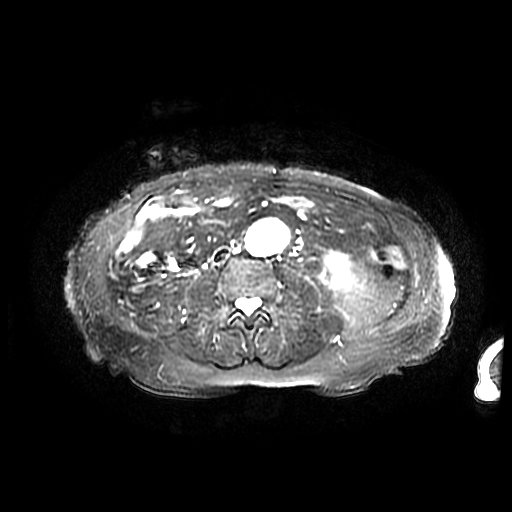

[Series 4: MRCP · coronal · 1.6mm · 0.62mm/px · 1 of 165 slices shown (1 of 2)]
[im 1/165]
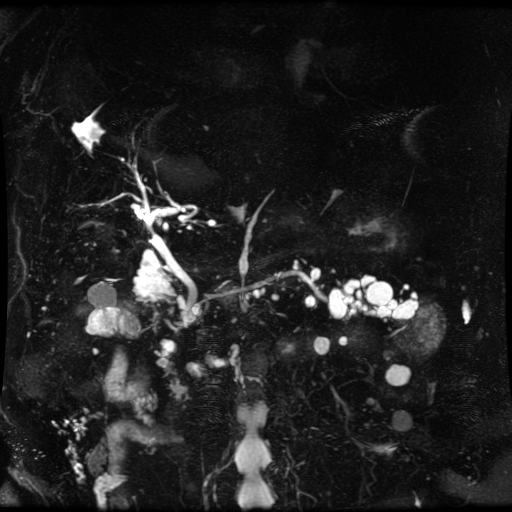

[Series 6: DWI b500 · axial · 6.0mm · 1.76mm/px · 1 of 67 slices shown]
[im 1/67]
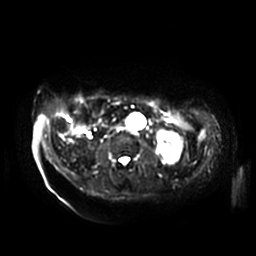

[Series 7: bSSFP fat-sat · coronal · 5.0mm · 0.72mm/px · 1 of 54 slices shown]
[im 1/54]
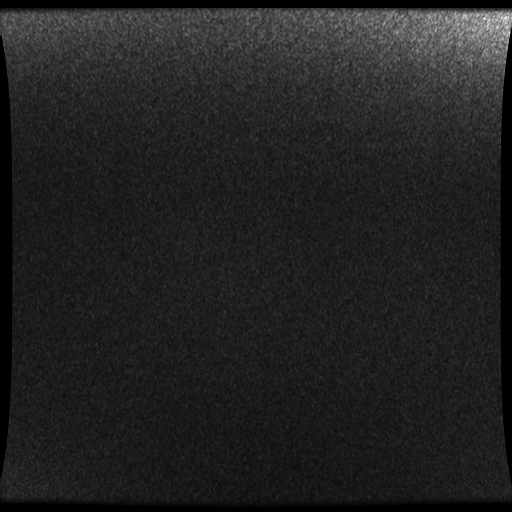

[Series 8: T2 · axial · 5.0mm · 0.86mm/px · 1 of 54 slices shown]
[im 1/54]
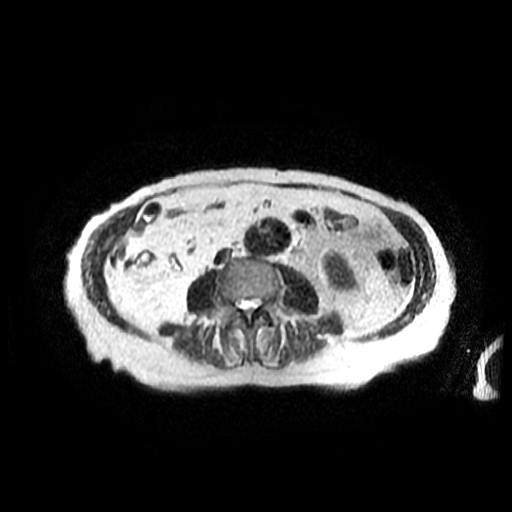

[Series 9: ax dualecho · axial · 5.0mm · 0.86mm/px · 1 of 108 slices shown]
[im 1/108]
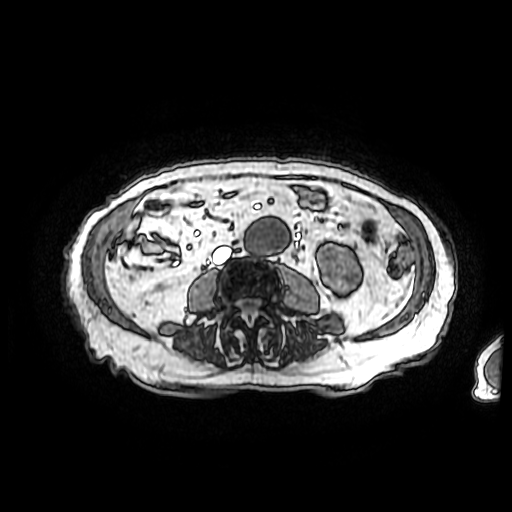

[Series 10: MRCP · coronal · 40.0mm · 0.70mm/px · 1 of 9 slices shown (2 of 2)]
[im 1/9]
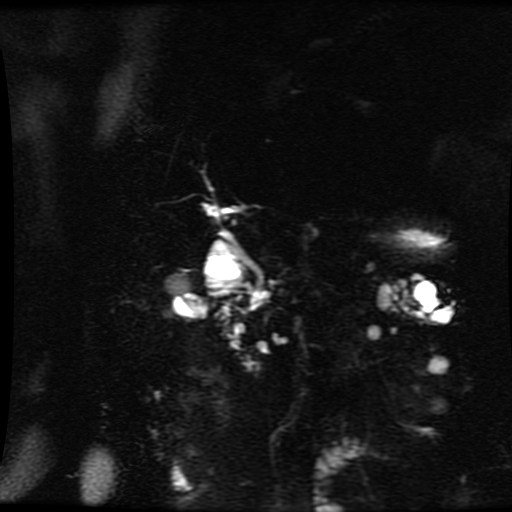

[Series 400: reformatted · axial · 1.6mm · 0.62mm/px · 1 of 117 slices shown (1 of 2)]
[im 1/117]
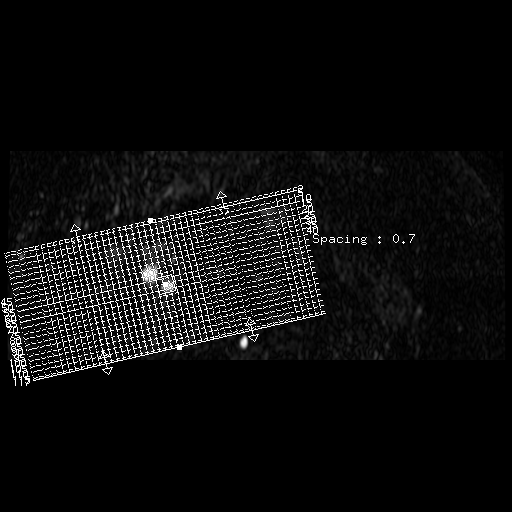

[Series 401: reformatted · coronal · 100.0mm · 0.51mm/px · 1 of 146 slices shown (2 of 2)]
[im 1/146]
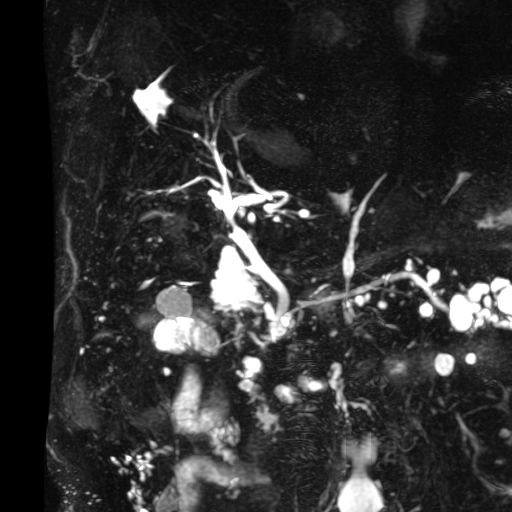

[Series 600: DWI · axial · 6.0mm · 1.76mm/px · 1 of 35 slices shown]
[im 1/35]
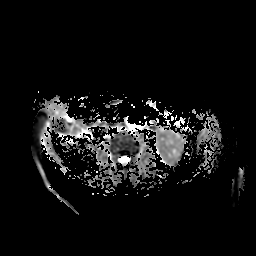

[Series 1100: T1 dynamic · axial · 5.4mm · 0.78mm/px · 1 of 88 slices shown (1 of 5)]
[im 1/88]
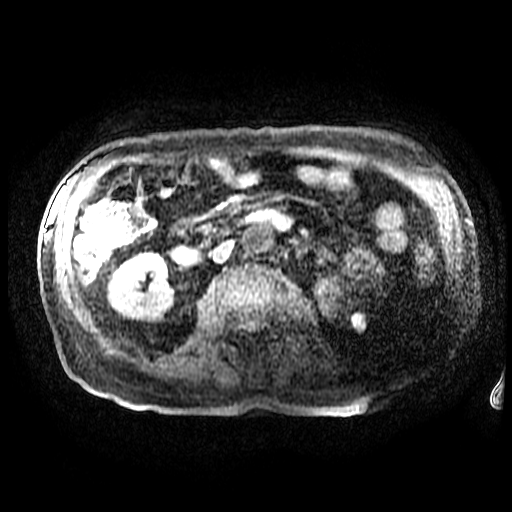

[Series 1101: T1 dynamic · axial · 5.4mm · 0.78mm/px · 1 of 88 slices shown (2 of 5)]
[im 1/88]
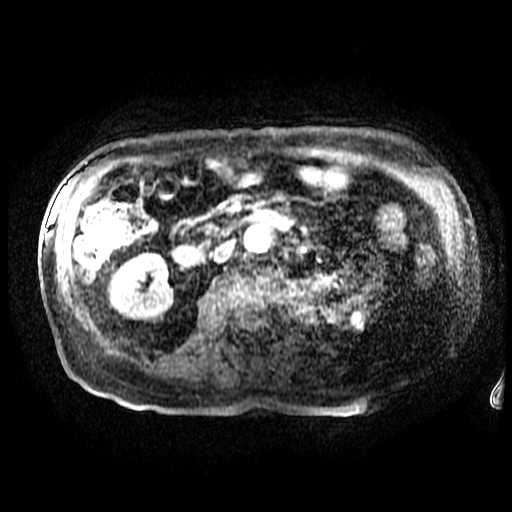

[Series 1103: T1 dynamic · axial · 5.4mm · 0.78mm/px · 1 of 88 slices shown (3 of 5)]
[im 1/88]
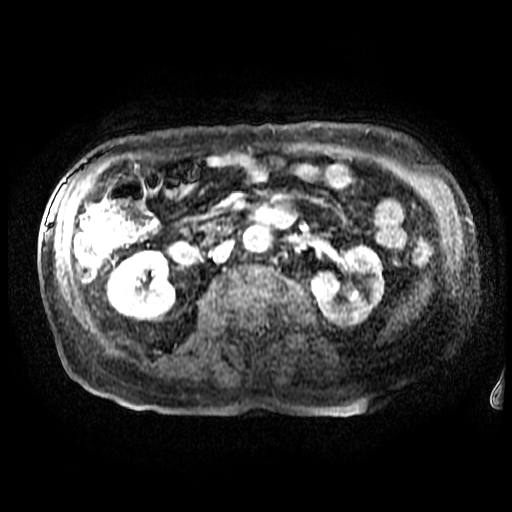

[Series 1104: T1 dynamic · axial · 5.4mm · 0.78mm/px · 1 of 88 slices shown (4 of 5)]
[im 1/88]
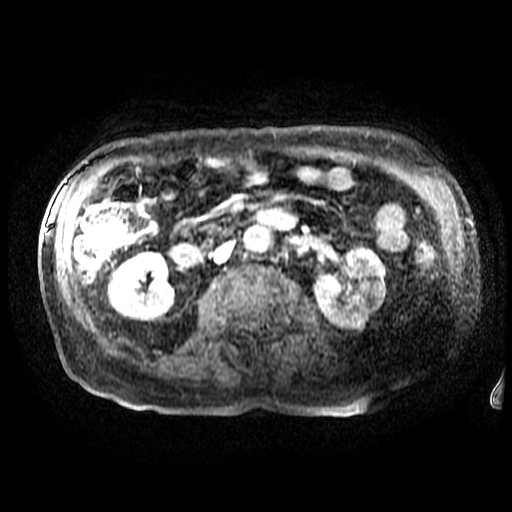

[Series 1105: T1 dynamic · axial · 5.4mm · 0.78mm/px · 1 of 88 slices shown (5 of 5)]
[im 1/88]
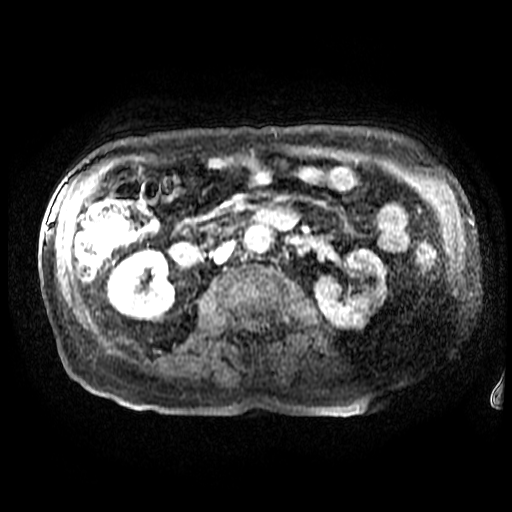

[((id)/(id)/1)-((id)/(id)/1) · axial · 5.4mm · 0.78mm/px · 1 of 87 slices shown (1 of 4)]
[im 1/87]
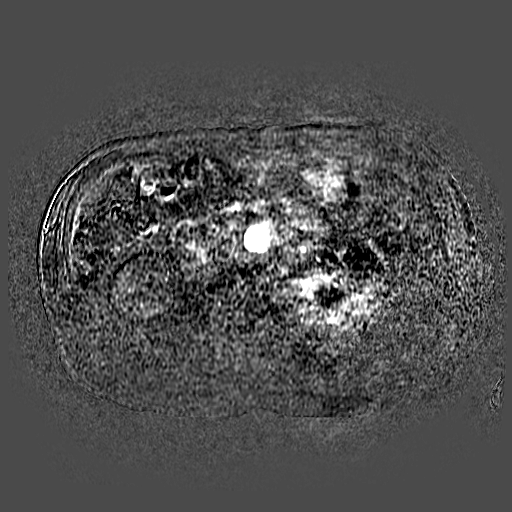

[((id)/(id)/1)-((id)/(id)/1) · axial · 5.4mm · 0.78mm/px · 1 of 88 slices shown (2 of 4)]
[im 1/88]
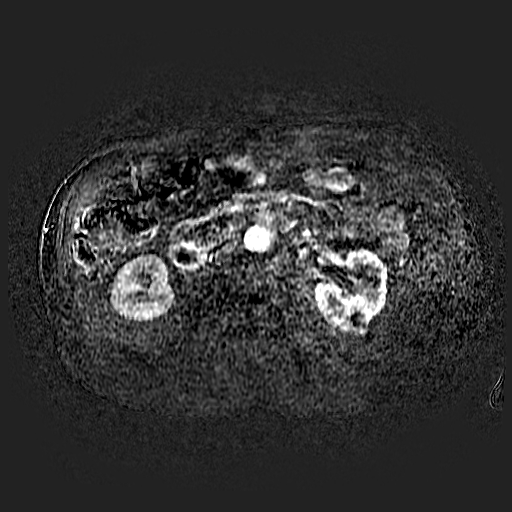

[((id)/(id)/1)-((id)/(id)/1) · axial · 5.4mm · 0.78mm/px · 1 of 87 slices shown (3 of 4)]
[im 1/87]
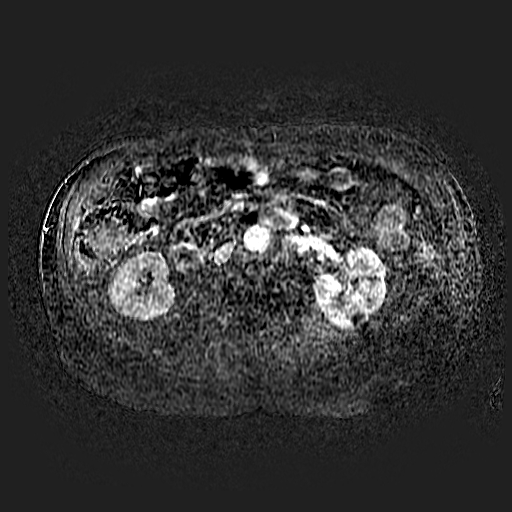

[((id)/(id)/1)-((id)/(id)/1) · axial · 5.4mm · 0.78mm/px · 1 of 88 slices shown (4 of 4)]
[im 1/88]
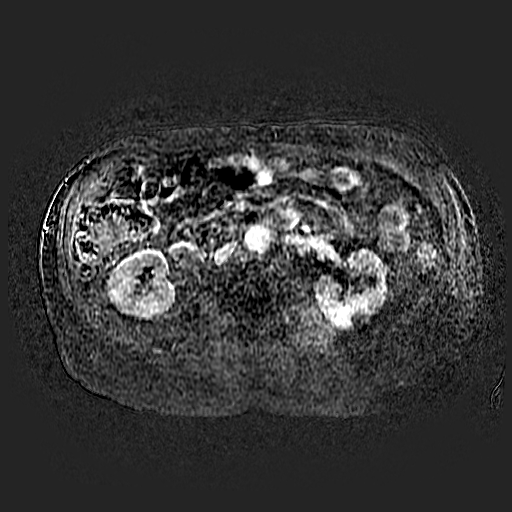

[19 of 23 positions shown; findings below may reference images not displayed]

FINDINGS: Lower chest: Right hemidiaphragm elevation. Mild to moderate
cardiomegaly, without pericardial or pleural effusion.

Hepatobiliary: Normal liver. Cholecystectomy, without biliary ductal
dilatation. The common duct measures 6 mm.

Pancreas: Findings likely of chronic pancreatitis. Innumerable foci
of side branch duct ectasia with borderline main duct dilatation,
primarily in the body and tail. Cystic foci within the pancreas
including within the dorsal tail at 2.0 cm on image 35/3 (1.7 cm
back in 0778), the ventral tail at 1.3 cm on image 38/3 (similar in
0778) within the pancreatic body at 1.3 cm on image 37/3 (similar in
0778). Smaller cystic foci within the pancreatic head including at
10 mm on image 42/3. Some lesions communicate with the pancreatic
duct on dedicated MRCP images.

No post-contrast suspicious features. No solid pancreatic lesions or
acute inflammation.

Spleen:  Normal in size, without focal abnormality.

Adrenals/Urinary Tract: Mild left adrenal thickening and nodularity
is similar to 0778. Normal right adrenal gland.

Innumerable bilateral renal cysts. Some lesions demonstrate
precontrast T1 hyperintensity on series 5577. No post-contrast
enhancement within any of these lesions. The postcontrast images do
not include the lower pole right renal 1.5 cm T1 hyperintense
lesion. Given T2 hyperintensity, this can be presumed a
hemorrhagic/proteinaceous cyst.

Stomach/Bowel: Normal stomach and small bowel loops. Extensive
colonic diverticulosis.

Vascular/Lymphatic: Advanced aortic and branch vessel
atherosclerosis. Patent portal and splenic veins. No retroperitoneal
or retrocrural adenopathy.

Other:  No ascites.

Musculoskeletal: No acute osseous abnormality.
IMPRESSION: 1. Multiple cystic lesions within the pancreas, superimposed upon
findings likely of chronic pancreatitis. These measure maximally
cm. The majority of these lesions are similar to 0778. Most likely
pseudocysts. Per consensus criteria, in this age group, follow-up
[HOSPITAL] 2 years, ideally with MRI/MRCP should be considered. This
recommendation follows ACR consensus guidelines: Management of
Incidental Pancreatic Cysts: A White Paper of the ACR Incidental
Findings Committee. [HOSPITAL] 7141;[DATE].
2.  Aortic Atherosclerosis ([id number]-[id number].S).
3. Renal cysts and complex cysts.

## 2019-10-31 ENCOUNTER — Other Ambulatory Visit (HOSPITAL_COMMUNITY): Payer: Self-pay | Admitting: *Deleted

## 2019-10-31 MED ORDER — DIGOXIN 125 MCG PO TABS: 62.5000 ug | ORAL_TABLET | Freq: Every day | ORAL | 3 refills | Status: DC

## 2019-11-11 ENCOUNTER — Other Ambulatory Visit (HOSPITAL_COMMUNITY): Payer: Self-pay | Admitting: Internal Medicine

## 2019-11-11 DIAGNOSIS — I739 Peripheral vascular disease, unspecified: Secondary | ICD-10-CM

## 2019-11-13 ENCOUNTER — Ambulatory Visit (HOSPITAL_COMMUNITY): Payer: Medicare Other

## 2019-12-26 ENCOUNTER — Encounter: Payer: Self-pay | Admitting: *Deleted

## 2019-12-31 ENCOUNTER — Ambulatory Visit: Payer: Medicare Other | Admitting: Dermatology

## 2020-01-06 DIAGNOSIS — E790 Hyperuricemia without signs of inflammatory arthritis and tophaceous disease: Secondary | ICD-10-CM | POA: Diagnosis not present

## 2020-01-06 DIAGNOSIS — D692 Other nonthrombocytopenic purpura: Secondary | ICD-10-CM | POA: Diagnosis not present

## 2020-01-06 DIAGNOSIS — D696 Thrombocytopenia, unspecified: Secondary | ICD-10-CM | POA: Diagnosis not present

## 2020-01-06 DIAGNOSIS — I5022 Chronic systolic (congestive) heart failure: Secondary | ICD-10-CM | POA: Diagnosis not present

## 2020-01-06 DIAGNOSIS — E118 Type 2 diabetes mellitus with unspecified complications: Secondary | ICD-10-CM | POA: Diagnosis not present

## 2020-01-06 DIAGNOSIS — M109 Gout, unspecified: Secondary | ICD-10-CM | POA: Diagnosis not present

## 2020-01-06 DIAGNOSIS — R634 Abnormal weight loss: Secondary | ICD-10-CM | POA: Diagnosis not present

## 2020-01-06 DIAGNOSIS — E1165 Type 2 diabetes mellitus with hyperglycemia: Secondary | ICD-10-CM | POA: Diagnosis not present

## 2020-01-06 DIAGNOSIS — Z23 Encounter for immunization: Secondary | ICD-10-CM | POA: Diagnosis not present

## 2020-01-06 DIAGNOSIS — D6869 Other thrombophilia: Secondary | ICD-10-CM | POA: Diagnosis not present

## 2020-01-06 DIAGNOSIS — I480 Paroxysmal atrial fibrillation: Secondary | ICD-10-CM | POA: Diagnosis not present

## 2020-01-06 DIAGNOSIS — N1831 Chronic kidney disease, stage 3a: Secondary | ICD-10-CM | POA: Diagnosis not present

## 2020-01-16 DIAGNOSIS — D696 Thrombocytopenia, unspecified: Secondary | ICD-10-CM | POA: Diagnosis not present

## 2020-01-16 DIAGNOSIS — E1165 Type 2 diabetes mellitus with hyperglycemia: Secondary | ICD-10-CM | POA: Diagnosis not present

## 2020-01-16 DIAGNOSIS — N1831 Chronic kidney disease, stage 3a: Secondary | ICD-10-CM | POA: Diagnosis not present

## 2020-01-16 DIAGNOSIS — E785 Hyperlipidemia, unspecified: Secondary | ICD-10-CM | POA: Diagnosis not present

## 2020-01-16 DIAGNOSIS — R634 Abnormal weight loss: Secondary | ICD-10-CM | POA: Diagnosis not present

## 2020-01-16 DIAGNOSIS — E790 Hyperuricemia without signs of inflammatory arthritis and tophaceous disease: Secondary | ICD-10-CM | POA: Diagnosis not present

## 2020-01-16 DIAGNOSIS — I5022 Chronic systolic (congestive) heart failure: Secondary | ICD-10-CM | POA: Diagnosis not present

## 2020-01-16 DIAGNOSIS — I480 Paroxysmal atrial fibrillation: Secondary | ICD-10-CM | POA: Diagnosis not present

## 2020-01-29 ENCOUNTER — Ambulatory Visit (INDEPENDENT_AMBULATORY_CARE_PROVIDER_SITE_OTHER): Payer: Medicare Other | Admitting: Dermatology

## 2020-01-29 ENCOUNTER — Encounter: Payer: Self-pay | Admitting: *Deleted

## 2020-01-29 ENCOUNTER — Other Ambulatory Visit: Payer: Self-pay

## 2020-01-29 DIAGNOSIS — L570 Actinic keratosis: Secondary | ICD-10-CM | POA: Diagnosis not present

## 2020-01-29 DIAGNOSIS — D0439 Carcinoma in situ of skin of other parts of face: Secondary | ICD-10-CM

## 2020-01-29 DIAGNOSIS — C4492 Squamous cell carcinoma of skin, unspecified: Secondary | ICD-10-CM

## 2020-01-29 DIAGNOSIS — D485 Neoplasm of uncertain behavior of skin: Secondary | ICD-10-CM

## 2020-01-29 HISTORY — DX: Squamous cell carcinoma of skin, unspecified: C44.92

## 2020-01-29 NOTE — Patient Instructions (Addendum)

## 2020-02-03 ENCOUNTER — Telehealth: Payer: Self-pay

## 2020-02-03 NOTE — Telephone Encounter (Signed)
-----   Message from Lavonna Monarch, MD sent at 01/31/2020 10:38 PM EDT ----- Schedule surgery with Dr. Darene Lamer

## 2020-02-03 NOTE — Telephone Encounter (Signed)
Phone call to patient with his pathology results. Voicemail left for patient to give the office a call back.  ?

## 2020-02-03 NOTE — Telephone Encounter (Signed)
Phone call from patient returning our call for his pathology results. Patient aware of results.  

## 2020-02-07 ENCOUNTER — Other Ambulatory Visit (HOSPITAL_COMMUNITY): Payer: Self-pay | Admitting: Internal Medicine

## 2020-02-07 ENCOUNTER — Other Ambulatory Visit: Payer: Self-pay

## 2020-02-07 MED ORDER — APIXABAN 2.5 MG PO TABS: 2.5000 mg | ORAL_TABLET | Freq: Two times a day (BID) | ORAL | 0 refills | Status: DC

## 2020-02-14 ENCOUNTER — Encounter (HOSPITAL_COMMUNITY): Payer: Medicare Other | Admitting: Internal Medicine

## 2020-02-24 ENCOUNTER — Other Ambulatory Visit (HOSPITAL_COMMUNITY): Payer: Self-pay | Admitting: *Deleted

## 2020-02-24 MED ORDER — METOPROLOL SUCCINATE ER 50 MG PO TB24: 50.0000 mg | ORAL_TABLET | Freq: Every day | ORAL | 3 refills | Status: DC

## 2020-02-26 ENCOUNTER — Other Ambulatory Visit (HOSPITAL_COMMUNITY): Payer: Self-pay | Admitting: *Deleted

## 2020-02-27 ENCOUNTER — Other Ambulatory Visit: Payer: Self-pay

## 2020-02-27 ENCOUNTER — Encounter: Payer: Self-pay | Admitting: Dermatology

## 2020-02-27 ENCOUNTER — Ambulatory Visit (INDEPENDENT_AMBULATORY_CARE_PROVIDER_SITE_OTHER): Payer: Medicare Other | Admitting: Dermatology

## 2020-02-27 DIAGNOSIS — D0439 Carcinoma in situ of skin of other parts of face: Secondary | ICD-10-CM | POA: Diagnosis not present

## 2020-02-27 DIAGNOSIS — L720 Epidermal cyst: Secondary | ICD-10-CM

## 2020-02-27 DIAGNOSIS — D099 Carcinoma in situ, unspecified: Secondary | ICD-10-CM

## 2020-02-27 NOTE — Patient Instructions (Addendum)
Biopsy, Surgery (Curettage) & Surgery (Excision) Aftercare Instructions  1. Okay to remove bandage in 24 hours  2. Wash area with soap and water  3. Apply Vaseline to area twice daily until healed (Not Neosporin)  4. Okay to cover with a Band-Aid to decrease the chance of infection or prevent irritation from clothing; also it's okay to uncover lesion at home.  5. Suture instructions: return to our office in 7-10 or 10-14 days for a nurse visit for suture removal. Variable healing with sutures, if pain or itching occurs call our office. It's okay to shower or bathe 24 hours after sutures are given.  6. The following risks may occur after a biopsy, curettage or excision: bleeding, scarring, discoloration, recurrence, infection (redness, yellow drainage, pain or swelling).  7. For questions, concerns and results call our office at [place] before 4pm & Friday before 3pm. Biopsy results will be available in 1 week.  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  Mr. Bendickson was told he will have an abrasion there for 1 to 2 weeks with absolutely no restrictions on bathing or activity.  The redness in the area will be slower to fade.  If it heals well we can do routine skin check annually as usual.  Additionally he had a sore spot on the back of his right scalp.  His wife was able to use some pressure and get some inflamed material out of it and now it is not sore and its flat with little crusting.  This is likely a benign inflamed cyst and requires no immediate intervention.  If there is a feelable residual nodule that bothers Mr. Bloom he will schedule 30 minutes to remove it.  I would like Mr. Persing to call the office in 2 to 3 weeks and leave me a message that he is doing okay.

## 2020-02-27 NOTE — Progress Notes (Signed)
CX3 5FU LEFT MALAR CHEEK TREATMENT SIZE 1.0

## 2020-03-01 ENCOUNTER — Encounter: Payer: Self-pay | Admitting: Dermatology

## 2020-03-01 NOTE — Progress Notes (Signed)
   Follow-Up Visit   Subjective  Ricardo [name] is a 84 y.o. male who presents for the following: Skin Problem (Check spot on left cheek. H/o scc in situ in that area. Also paient has a skin tag on left neck area. ).  Growth Location: Cheek Duration:  Quality:  Associated Signs/Symptoms: Modifying Factors:  Severity:  Timing: Context: History of skin cancer  Objective  Well appearing patient in no apparent distress; mood and affect are within normal limits.  All skin waist up examined.   Assessment & Plan   Patient Instructions   Biopsy, Surgery (Curettage) & Surgery (Excision) Aftercare Instructions  1. Okay to remove bandage in 24 hours  2. Wash area with soap and water  3. Apply Vaseline to area twice daily until healed (Not Neosporin)  4. Okay to cover with a Band-Aid to decrease the chance of infection or prevent irritation from clothing; also it's okay to uncover lesion at home.  5. Suture instructions: return to our office in 7-10 or 10-14 days for a nurse visit for suture removal. Variable healing with sutures, if pain or itching occurs call our office. It's okay to shower or bathe 24 hours after sutures are given.  6. The following risks may occur after a biopsy, curettage or excision: bleeding, scarring, discoloration, recurrence, infection (redness, yellow drainage, pain or swelling).  7. For questions, concerns and results call our office at [place] before 4pm & Friday before 3pm. Biopsy results will be available in 1 week.     Neoplasm of uncertain behavior of skin Left Malar Cheek  Skin / nail biopsy Type of biopsy: tangential   Informed consent: discussed and consent obtained   Timeout: patient name, date of birth, surgical site, and procedure verified   Procedure prep:  Patient was prepped and draped in usual sterile fashion Prep type:  Chlorhexidine Anesthesia: the lesion was anesthetized in a standard fashion   Anesthetic:  1% lidocaine w/  epinephrine 1-100,000 local infiltration Instrument used: flexible razor blade   Hemostasis achieved with: ferric subsulfate   Outcome: patient tolerated procedure well   Post-procedure details: wound care instructions given    Specimen 1 - Surgical pathology Differential Diagnosis:scc/bss Check Margins: No    curet and cautery after biopsy  AK (actinic keratosis) Left Breast  Destruction of lesion - Left Breast  Destruction method: cryotherapy   Informed consent: discussed and consent obtained   Timeout:  patient name, date of birth, surgical site, and procedure verified Lesion destroyed using liquid nitrogen: Yes   Region frozen until ice ball extended beyond lesion: Yes   Cryotherapy cycles:  5 Outcome: patient tolerated procedure well with no complications       I, Lavonna Monarch, MD, have reviewed all documentation for this visit.  The documentation on 03/01/20 for the exam, diagnosis, procedures, and orders are all accurate and complete.

## 2020-03-09 ENCOUNTER — Other Ambulatory Visit: Payer: Self-pay | Admitting: Internal Medicine

## 2020-03-14 DIAGNOSIS — K5792 Diverticulitis of intestine, part unspecified, without perforation or abscess without bleeding: Secondary | ICD-10-CM | POA: Diagnosis not present

## 2020-03-24 ENCOUNTER — Encounter: Payer: Self-pay | Admitting: Dermatology

## 2020-03-24 NOTE — Progress Notes (Signed)
   Follow-Up Visit   Subjective  Ricardo [name] is a 84 y.o. male who presents for the following: Procedure (Patient here today for treatmetn CIS x 1 left malar cheek).  CIS Location: Right cheek Duration:  Quality:  Associated Signs/Symptoms: Modifying Factors:  Severity:  Timing: Context: For treatment of CIS. Also recently had an inflamed cyst on back of scalp that wife help drain.  Objective  Well appearing patient in no apparent distress; mood and affect are within normal limits.  A focused examination was performed including Head and neck.. Relevant physical exam findings are noted in the Assessment and Plan.   Assessment & Plan    Epidermal cyst Mid Occipital Scalp  Return if inflammation recurs.  May choose optional excision and 1+ month.  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   Destruction of lesion Complexity: simple   Destruction method: electrodesiccation and curettage   Informed consent: discussed and consent obtained   Timeout:  patient name, date of birth, surgical site, and procedure verified Anesthesia: the lesion was anesthetized in a standard fashion   Anesthetic:  1% lidocaine w/ epinephrine 1-100,000 local infiltration Curettage performed in three different directions: Yes   Curettage cycles:  3 Lesion length (cm):  1 Lesion width (cm):  1 Margin per side (cm):  0 Final wound size (cm):  1 Hemostasis achieved with:  ferric subsulfate Outcome: patient tolerated procedure well with no complications   Post-procedure details: wound care instructions given   Additional details:  Inoculated with parenteral 5% fluorouracil  Primary reason for the visit today is a small carcinoma in situ on the left cheek; curette showed that this had no depth and minimal width.  Ricardo [name] was told he will have an abrasion there for 1 to 2 weeks with absolutely no restrictions on bathing or activity.  The redness in the area will be slower to fade.  If it heals  well we can do routine skin check annually as usual.  Additionally he had a sore spot on the back of his right scalp.  His wife was able to use some pressure and get some inflamed material out of it and now it is not sore and its flat with little crusting.  This is likely a benign inflamed cyst and requires no immediate intervention.  If there is a feelable residual nodule that bothers Ricardo [name] he will schedule 30 minutes to remove it.  I would like Ricardo [name] to call the office in 2 to 3 weeks and leave me a message that he is doing okay.   I, Lavonna Monarch, MD, have reviewed all documentation for this visit.  The documentation on 03/24/20 for the exam, diagnosis, procedures, and orders are all accurate and complete.

## 2020-04-01 ENCOUNTER — Other Ambulatory Visit: Payer: Self-pay | Admitting: Internal Medicine

## 2020-04-03 DIAGNOSIS — I10 Essential (primary) hypertension: Secondary | ICD-10-CM | POA: Diagnosis not present

## 2020-04-03 DIAGNOSIS — I5022 Chronic systolic (congestive) heart failure: Secondary | ICD-10-CM | POA: Diagnosis not present

## 2020-04-03 DIAGNOSIS — E118 Type 2 diabetes mellitus with unspecified complications: Secondary | ICD-10-CM | POA: Diagnosis not present

## 2020-04-03 DIAGNOSIS — E1165 Type 2 diabetes mellitus with hyperglycemia: Secondary | ICD-10-CM | POA: Diagnosis not present

## 2020-04-03 DIAGNOSIS — R001 Bradycardia, unspecified: Secondary | ICD-10-CM | POA: Diagnosis not present

## 2020-04-03 DIAGNOSIS — E782 Mixed hyperlipidemia: Secondary | ICD-10-CM | POA: Diagnosis not present

## 2020-04-03 DIAGNOSIS — I482 Chronic atrial fibrillation, unspecified: Secondary | ICD-10-CM | POA: Diagnosis not present

## 2020-04-05 ENCOUNTER — Other Ambulatory Visit: Payer: Self-pay | Admitting: Internal Medicine

## 2020-05-03 ENCOUNTER — Other Ambulatory Visit: Payer: Self-pay | Admitting: Internal Medicine

## 2020-05-06 ENCOUNTER — Encounter (HOSPITAL_COMMUNITY): Payer: Self-pay | Admitting: Internal Medicine

## 2020-05-06 ENCOUNTER — Ambulatory Visit (HOSPITAL_COMMUNITY)
Admission: RE | Admit: 2020-05-06 | Discharge: 2020-05-06 | Disposition: A | Discharge status: Home / Self Care | Payer: Medicare Other | Source: Ambulatory Visit | Attending: Internal Medicine | Admitting: Internal Medicine

## 2020-05-06 ENCOUNTER — Other Ambulatory Visit: Payer: Self-pay

## 2020-05-06 VITALS — BP 128/60 | HR 57 | Ht 66.0 in | Wt 140.4 lb

## 2020-05-06 DIAGNOSIS — I482 Chronic atrial fibrillation, unspecified: Secondary | ICD-10-CM | POA: Diagnosis not present

## 2020-05-06 DIAGNOSIS — Q211 Atrial septal defect: Secondary | ICD-10-CM | POA: Diagnosis not present

## 2020-05-06 DIAGNOSIS — I2510 Atherosclerotic heart disease of native coronary artery without angina pectoris: Secondary | ICD-10-CM | POA: Diagnosis not present

## 2020-05-06 DIAGNOSIS — I350 Nonrheumatic aortic (valve) stenosis: Secondary | ICD-10-CM | POA: Diagnosis not present

## 2020-05-06 DIAGNOSIS — I493 Ventricular premature depolarization: Secondary | ICD-10-CM | POA: Insufficient documentation

## 2020-05-06 DIAGNOSIS — K219 Gastro-esophageal reflux disease without esophagitis: Secondary | ICD-10-CM | POA: Insufficient documentation

## 2020-05-06 DIAGNOSIS — G4733 Obstructive sleep apnea (adult) (pediatric): Secondary | ICD-10-CM | POA: Insufficient documentation

## 2020-05-06 DIAGNOSIS — Z8249 Family history of ischemic heart disease and other diseases of the circulatory system: Secondary | ICD-10-CM | POA: Diagnosis not present

## 2020-05-06 DIAGNOSIS — I5022 Chronic systolic (congestive) heart failure: Secondary | ICD-10-CM | POA: Diagnosis not present

## 2020-05-06 DIAGNOSIS — I110 Hypertensive heart disease with heart failure: Secondary | ICD-10-CM | POA: Diagnosis not present

## 2020-05-06 DIAGNOSIS — Z79899 Other long term (current) drug therapy: Secondary | ICD-10-CM | POA: Insufficient documentation

## 2020-05-06 DIAGNOSIS — Z7901 Long term (current) use of anticoagulants: Secondary | ICD-10-CM | POA: Diagnosis not present

## 2020-05-06 DIAGNOSIS — M109 Gout, unspecified: Secondary | ICD-10-CM | POA: Diagnosis not present

## 2020-05-06 DIAGNOSIS — E119 Type 2 diabetes mellitus without complications: Secondary | ICD-10-CM | POA: Insufficient documentation

## 2020-05-06 MED ORDER — POTASSIUM CHLORIDE CRYS ER 20 MEQ PO TBCR: 40.0000 meq | EXTENDED_RELEASE_TABLET | Freq: Every day | ORAL | 3 refills | Status: DC

## 2020-05-06 NOTE — Patient Instructions (Signed)
Your physician has requested that you have an echocardiogram. Echocardiography is a painless test that uses sound waves to create images of your heart. It provides your doctor with information about the size and shape of your heart and how well your heart's chambers and valves are working. This procedure takes approximately one hour. There are no restrictions for this procedure.   Your medication refill has been sent to your pharmacy  Please call our office in June 2022 to schedule a follow up appointment  If you have any questions or concerns before your next appointment please send [place] a message through [place] or call our office at [phone].    TO LEAVE A MESSAGE FOR THE NURSE SELECT OPTION 2, PLEASE LEAVE A MESSAGE INCLUDING: . YOUR NAME . DATE OF BIRTH . CALL BACK NUMBER . REASON FOR CALL**this is important as we prioritize the call backs  [place] AS LONG AS YOU CALL BEFORE 4:00 PM  At the [facility], you and your health needs are our priority. As part of our continuing mission to provide you with exceptional heart care, we have created designated Provider Care Teams. These Care Teams include your primary Cardiologist (physician) and Advanced Practice Providers (APPs- Physician Assistants and Nurse Practitioners) who all work together to provide you with the care you need, when you need it.   You may see any of the following providers on your designated Care Team at your next follow up: Marland Kitchen Dr Glori Bickers . Dr Loralie Champagne . Darrick Grinder, NP . Lyda Jester, PA . Audry Riles, PharmD   Please be sure to bring in all your medications bottles to every appointment.

## 2020-05-06 NOTE — Addendum Note (Signed)
Encounter addended by: Malena Edman, RN on: 05/06/2020 2:40 PM  Actions taken: Order list changed, Diagnosis association updated, Clinical Note Signed

## 2020-05-06 NOTE — Progress Notes (Addendum)
Advanced Heart Failure Clinic Note  Date:  05/06/2020   ID:  Ricardo [name], DOB [birth year], MRN [id number]  Location: Home  Provider location: [facility] Type of Visit: Established patient  PCP:  Audley Hose, MD  Cardiologist:  No primary care provider on file. Primary HF: Rechel Delosreyes  Chief Complaint: Heart Failure follow-up   History of Present Illness:  Ricardo [name] is a 84 y.o. male  with h/o HTN, diabetes, CAD and chronic atrial fibrillation.  Has had AF for years. Followed by Dr. Ola Spurr at [facility]. He is s/p previous AFL ablation. Echo in 12/13 EF 40-45%.. Had sleep study in past and placed on CPAP. He was subsequently told OSA was mild and CPAP stopped.   In January 2018 began to develop HF symptoms. AF rate elevated. Echo 40-45% with moderate RV dysfunction. Mild AS. Diuresed 9 pounds and placed on metoprolol and digoxin for HR control. Weight on d/c 145. Sent home on lasix 40 daily.  Cath 4/18 Very heavily calcified coronary arteries with high grade lesion in moderate-sized OM1 and tandem 70% and 80% lesions in mid LAD. Distal PDA 90%. Treated medically.   Here is here for f/u. Still working full-time. Riding 2-3 miles on the exercise bike over 20 minutes. No CP or SOB. No edema. No problems with meds. No bleeding with Eliquis.   cMRI 1/19  1) Mild LVE with moderate LVH Diffuse hypokinesis worse in the inferolateral wall. Quantitative EF 43% 2) SEMI seen in basal anterolateral and inferolateral walls as well as mid inferolateral wall 3) Severe LAE Moderate RAE 4) PFO present 5) Moderate appearing MR 6) Tri- leaflet AV with some thickening and restricted motion suggest echo correlation  Echo 11/06/16 LVEF 40%, Mild/Mod MR, Severe LAE, Severe RAE, + Patent foramen ovale  Cath 11/15/16 Very heavily calcified coronary arteries with high grade lesion in moderate-sized OM1 and tandem 70% and 80% lesions in mid LAD. Distal PDA  90%. Treated medically.   RA = 6 RV = 48/6 PA = 48/19 (29) PCW = 17 Ao = 135/89 (108) LV = 147/14 Fick cardiac output/index = 3.8/2.2 PVR = 3.2 SVR = 2131 FA sat = 98% PA sat = 67%. 69%   Past Medical History:  Diagnosis Date  . Atrial fibrillation (HCC)    on coumadin.  cardiologist is in [facility] dr [name].  S/P multiple DCCVs and prior ablation.   . Basal cell carcinoma 12/23/2014   nod-left forearm (CX35FU)  . BPH (benign prostatic hyperplasia)    by ST in 2000.   Marland Kitchen CHF (congestive heart failure) ([place])   . Complication of anesthesia    " sometimes slow to wake up"  . Diabetes mellitus without complication ([place])   . Diverticulitis    question if he ever had imaging confirmed diverticulitis  . GERD (gastroesophageal reflux disease)   . GI bleed   . Gout   . Hypertension   . Kidney stones    treated with lithotripsy   . Pneumonia   . PONV (postoperative nausea and vomiting)   . Rupture of biceps tendon 2006   left distal bicep tendon  . SCCA (squamous cell carcinoma) of skin 01/29/2020   Left Malar Cheek (in situ)  . Squamous cell carcinoma of skin 04/08/2019   in situ-left cheek-txpbx  . UTI (urinary tract infection)    Past Surgical History:  Procedure Laterality Date  . CARDIOVERSION    . CATARACT EXTRACTION, BILATERAL    .  CHOLECYSTECTOMY    . COLONOSCOPY  04/18/2012   Procedure: COLONOSCOPY;  Surgeon: [name], MD;  Location: [place];  Service: Endoscopy;  Laterality: N/A;  . DISTAL BICEPS TENDON REPAIR  2006   dr [name]  . HEMORRHOID SURGERY     twice  . HERNIA REPAIR    . LITHOTRIPSY     of kidney stones.   Marland Kitchen PALATE / UVULA BIOPSY / EXCISION     uvula excision  . RIGHT/LEFT HEART CATH AND CORONARY ANGIOGRAPHY N/A 11/15/2016   Procedure: Right/Left Heart Cath and Coronary Angiography;  Surgeon: [name], MD;  Location: [facility];  Service: Cardiovascular;  Laterality: N/A;  . thyroid nodule excision        Current Outpatient Medications  Medication Sig Dispense Refill  . b complex vitamins tablet Take 1 tablet by mouth daily.    . Cinnamon 500 MG capsule Take 1,000 mg by mouth daily.    . Coenzyme Q10 (CO Q10) 200 MG CAPS Take 200 mcg by mouth daily.    . colchicine 0.6 MG tablet Take 0.6 mg by mouth 2 (two) times daily.    . cyanocobalamin 100 MCG tablet Take 1,000 mcg by mouth daily.    Marland Kitchen dexlansoprazole (DEXILANT) 60 MG capsule Take 60 mg by mouth daily.    . digoxin (LANOXIN) 0.125 MG tablet Take 0.5 tablets (62.5 mcg total) by mouth daily. 45 tablet 3  . ELIQUIS 2.5 MG TABS tablet TAKE 1 TABLET(2.5 MG) BY MOUTH TWICE DAILY 60 tablet 0  . empagliflozin (JARDIANCE) 10 MG TABS tablet Take 10 mg by mouth daily.    Marland Kitchen ENTRESTO 24-26 MG TAKE 1 TABLET BY MOUTH TWICE DAILY 180 tablet 2  . ergocalciferol (VITAMIN D2) 1.25 MG (50000 UT) capsule Vitamin D2 1,250 mcg (50,000 unit) capsule  Take 1 capsule every week by oral route.    . fluticasone (FLONASE) 50 MCG/ACT nasal spray Place 1 spray into both nostrils at bedtime.    Marland Kitchen ipratropium (ATROVENT) 0.03 % nasal spray ipratropium bromide 21 mcg (0.03 %) nasal spray    . LINZESS 72 MCG capsule Take 72 mcg by mouth every morning.    Marland Kitchen losartan (COZAAR) 25 MG tablet Take by mouth.    . metoprolol succinate (TOPROL-XL) 50 MG 24 hr tablet Take 1 tablet (50 mg total) by mouth daily. Take with or immediately following a meal. 90 tablet 3  . omeprazole (PRILOSEC) 40 MG capsule omeprazole 40 mg capsule,delayed release    . potassium chloride SA (K-DUR) 20 MEQ tablet Take 2 tablets (40 mEq total) by mouth daily. 180 tablet 3  . rosuvastatin (CRESTOR) 5 MG tablet SMARTSIG:1 Tablet(s) By Mouth Every Evening    . torsemide (DEMADEX) 20 MG tablet Take 10 mg by mouth daily.    . vitamin C (ASCORBIC ACID) 250 MG tablet Take 250 mg by mouth daily.    Marland Kitchen diltiazem (TIAZAC) 180 MG 24 hr capsule Take by mouth.    . furosemide (LASIX) 20 MG tablet Take by mouth.  (Patient not taking: Reported on 05/06/2020)    . ketoconazole (NIZORAL) 2 % cream APPLY TO SOLE OF BOTH FEET ONCE DAILY (Patient not taking: Reported on 05/06/2020)    . meclizine (ANTIVERT) 12.5 MG tablet meclizine 12.5 mg tablet (Patient not taking: Reported on 05/06/2020)     No current facility-administered medications for this encounter.    Allergies:   Codeine and Tape   Social History:  The patient  reports  that he has never smoked. He has never used smokeless tobacco. He reports current alcohol use. He reports that he does not use drugs.   Family History:  The patient's family history includes Congestive Heart Failure in his father; Stroke in his father; Tuberculosis in his mother.   ROS:  Please see the history of present illness.   All other systems are personally reviewed and negative.   Vitals:   05/06/20 1406  BP: 128/60  Pulse: (!) 57  SpO2: 98%  Weight: 63.7 kg (140 lb 6.4 oz)  Height: 5\' 6"  (1.676 m)    Exam:  General:  Well appearing. No resp difficulty HEENT: normal Neck: supple. no JVD. Carotids 2+ bilat; + bruits. No lymphadenopathy or thryomegaly appreciated. Cor: PMI nondisplaced. Iregular rate & rhythm. 2/6 AS Lungs: clear Abdomen: soft, nontender, nondistended. No hepatosplenomegaly. No bruits or masses. Good bowel sounds. Extremities: no cyanosis, clubbing, rash, edema Neuro: alert & orientedx3, cranial nerves grossly intact. moves all 4 extremities w/o difficulty. Affect pleasant  Recent Labs: No results found for requested labs within last 8760 hours.  Personally reviewed   Wt Readings from Last 3 Encounters:  05/06/20 63.7 kg (140 lb 6.4 oz)  10/19/18 68 kg (150 lb)  02/07/18 72.9 kg (160 lb 12.8 oz)      ASSESSMENT AND PLAN:  1. Chronic systolic HF - Echo 10/28/15 EF 40-45% with moderate RV dysfunction. Seems out of proportion to CAD. ? PVC related with 10% PVCs on Holter 9/18. - SPEP negative. CMRI 2019 no infiltrative process EF 43% -  Continues to do very well.  NYHA I. Volume status stable on torsemide 10 mg daily - Continue Toprol 50mg  daily. HR too low to titrate - Continue entresto to 49-51 mg twice a day. Have not titrated due to intermittent lightheadedness.  - Unable to tolerate spiro due to severe gynecomastia - Repeat echo  - Had bloodwork with PCP recently.  2. Chronic AF  - Previously with RVR. Toprol at 50 daily.  - HR typically in 62- 12s - CHADSVASC = 5 (age, DM, HTN, HF) - Continue Eliquis 2.5 bid (age > 39, last creatinine 1.45). Minor epistaxis at times. Has had cauterization in past.   3. Mild aortic stenosis - Moderately calcified on Echo 11/06/16. - Mean gradient 9 mm Hg, Peak gradient 15 mm Hg - Repeat echo as above  4. HTN  - Blood pressure well controlled. Continue current regimen.  5. DM2 - Per PCP. Now on Jardiance  6. Suspected sleep apnea.  - Sleep study was negative.    7. CAD - He has highly calcified coronary arteries with borderline lesion in midLAD.  - No signs or symptoms of ischemia. Continue medical therapy - No change   Signed, Glori Bickers, MD  05/06/2020 2:23 PM  Advanced Heart Failure [place] [address] Heart and [place] [address] [phone] (office) [phone] (fax)

## 2020-05-11 DIAGNOSIS — H938X3 Other specified disorders of ear, bilateral: Secondary | ICD-10-CM | POA: Diagnosis not present

## 2020-05-11 DIAGNOSIS — J342 Deviated nasal septum: Secondary | ICD-10-CM | POA: Diagnosis not present

## 2020-05-14 ENCOUNTER — Ambulatory Visit (HOSPITAL_COMMUNITY): Payer: Medicare Other

## 2020-05-14 ENCOUNTER — Ambulatory Visit (HOSPITAL_COMMUNITY)
Admission: RE | Admit: 2020-05-14 | Discharge: 2020-05-14 | Disposition: A | Discharge status: Home / Self Care | Payer: Medicare Other | Source: Ambulatory Visit | Attending: Internal Medicine | Admitting: Internal Medicine

## 2020-05-14 ENCOUNTER — Other Ambulatory Visit: Payer: Self-pay

## 2020-05-14 DIAGNOSIS — I4891 Unspecified atrial fibrillation: Secondary | ICD-10-CM | POA: Insufficient documentation

## 2020-05-14 DIAGNOSIS — I5022 Chronic systolic (congestive) heart failure: Secondary | ICD-10-CM | POA: Diagnosis not present

## 2020-05-14 DIAGNOSIS — E119 Type 2 diabetes mellitus without complications: Secondary | ICD-10-CM | POA: Diagnosis not present

## 2020-05-14 DIAGNOSIS — I110 Hypertensive heart disease with heart failure: Secondary | ICD-10-CM | POA: Insufficient documentation

## 2020-05-14 LAB — ECHOCARDIOGRAM COMPLETE
Area-P 1/2: 4.39 cm2
S' Lateral: 3.6 cm

## 2020-05-14 NOTE — Progress Notes (Signed)
  Echocardiogram 2D Echocardiogram has been performed.  Jannett Celestine 05/14/2020, 1:31 PM

## 2020-05-18 DIAGNOSIS — J019 Acute sinusitis, unspecified: Secondary | ICD-10-CM | POA: Diagnosis not present

## 2020-05-18 DIAGNOSIS — D692 Other nonthrombocytopenic purpura: Secondary | ICD-10-CM | POA: Diagnosis not present

## 2020-05-18 DIAGNOSIS — H9313 Tinnitus, bilateral: Secondary | ICD-10-CM | POA: Diagnosis not present

## 2020-05-18 DIAGNOSIS — H9193 Unspecified hearing loss, bilateral: Secondary | ICD-10-CM | POA: Diagnosis not present

## 2020-05-22 DIAGNOSIS — Z23 Encounter for immunization: Secondary | ICD-10-CM | POA: Diagnosis not present

## 2020-05-30 ENCOUNTER — Other Ambulatory Visit: Payer: Self-pay | Admitting: Internal Medicine

## 2020-06-08 DIAGNOSIS — R1032 Left lower quadrant pain: Secondary | ICD-10-CM | POA: Diagnosis not present

## 2020-06-08 DIAGNOSIS — I739 Peripheral vascular disease, unspecified: Secondary | ICD-10-CM | POA: Diagnosis not present

## 2020-06-11 DIAGNOSIS — Z23 Encounter for immunization: Secondary | ICD-10-CM | POA: Diagnosis not present

## 2020-06-15 DIAGNOSIS — Z961 Presence of intraocular lens: Secondary | ICD-10-CM | POA: Diagnosis not present

## 2020-06-29 DIAGNOSIS — E118 Type 2 diabetes mellitus with unspecified complications: Secondary | ICD-10-CM | POA: Diagnosis not present

## 2020-06-29 DIAGNOSIS — E1165 Type 2 diabetes mellitus with hyperglycemia: Secondary | ICD-10-CM | POA: Diagnosis not present

## 2020-06-29 DIAGNOSIS — I10 Essential (primary) hypertension: Secondary | ICD-10-CM | POA: Diagnosis not present

## 2020-06-29 DIAGNOSIS — H9311 Tinnitus, right ear: Secondary | ICD-10-CM | POA: Diagnosis not present

## 2020-06-29 DIAGNOSIS — R1032 Left lower quadrant pain: Secondary | ICD-10-CM | POA: Diagnosis not present

## 2020-06-29 DIAGNOSIS — N1831 Chronic kidney disease, stage 3a: Secondary | ICD-10-CM | POA: Diagnosis not present

## 2020-08-26 DIAGNOSIS — E559 Vitamin D deficiency, unspecified: Secondary | ICD-10-CM | POA: Diagnosis not present

## 2020-08-26 DIAGNOSIS — N189 Chronic kidney disease, unspecified: Secondary | ICD-10-CM | POA: Diagnosis not present

## 2020-09-02 DIAGNOSIS — N2581 Secondary hyperparathyroidism of renal origin: Secondary | ICD-10-CM | POA: Diagnosis not present

## 2020-09-02 DIAGNOSIS — I129 Hypertensive chronic kidney disease with stage 1 through stage 4 chronic kidney disease, or unspecified chronic kidney disease: Secondary | ICD-10-CM | POA: Diagnosis not present

## 2020-09-02 DIAGNOSIS — N1831 Chronic kidney disease, stage 3a: Secondary | ICD-10-CM | POA: Diagnosis not present

## 2020-09-02 DIAGNOSIS — D631 Anemia in chronic kidney disease: Secondary | ICD-10-CM | POA: Diagnosis not present

## 2020-10-05 DIAGNOSIS — E118 Type 2 diabetes mellitus with unspecified complications: Secondary | ICD-10-CM | POA: Diagnosis not present

## 2020-10-05 DIAGNOSIS — E1165 Type 2 diabetes mellitus with hyperglycemia: Secondary | ICD-10-CM | POA: Diagnosis not present

## 2020-10-05 DIAGNOSIS — E782 Mixed hyperlipidemia: Secondary | ICD-10-CM | POA: Diagnosis not present

## 2020-10-05 DIAGNOSIS — I5022 Chronic systolic (congestive) heart failure: Secondary | ICD-10-CM | POA: Diagnosis not present

## 2020-10-05 DIAGNOSIS — I739 Peripheral vascular disease, unspecified: Secondary | ICD-10-CM | POA: Diagnosis not present

## 2020-10-05 DIAGNOSIS — E559 Vitamin D deficiency, unspecified: Secondary | ICD-10-CM | POA: Diagnosis not present

## 2020-10-05 DIAGNOSIS — N1831 Chronic kidney disease, stage 3a: Secondary | ICD-10-CM | POA: Diagnosis not present

## 2020-10-05 DIAGNOSIS — I10 Essential (primary) hypertension: Secondary | ICD-10-CM | POA: Diagnosis not present

## 2020-10-05 DIAGNOSIS — H6121 Impacted cerumen, right ear: Secondary | ICD-10-CM | POA: Diagnosis not present

## 2020-10-05 DIAGNOSIS — I482 Chronic atrial fibrillation, unspecified: Secondary | ICD-10-CM | POA: Diagnosis not present

## 2020-10-05 DIAGNOSIS — H9193 Unspecified hearing loss, bilateral: Secondary | ICD-10-CM | POA: Diagnosis not present

## 2020-10-05 DIAGNOSIS — D692 Other nonthrombocytopenic purpura: Secondary | ICD-10-CM | POA: Diagnosis not present

## 2020-12-03 DIAGNOSIS — H903 Sensorineural hearing loss, bilateral: Secondary | ICD-10-CM | POA: Diagnosis not present

## 2020-12-09 ENCOUNTER — Ambulatory Visit (INDEPENDENT_AMBULATORY_CARE_PROVIDER_SITE_OTHER): Payer: Medicare Other | Admitting: Dermatology

## 2020-12-09 ENCOUNTER — Other Ambulatory Visit: Payer: Self-pay

## 2020-12-09 DIAGNOSIS — L570 Actinic keratosis: Secondary | ICD-10-CM

## 2020-12-09 DIAGNOSIS — D485 Neoplasm of uncertain behavior of skin: Secondary | ICD-10-CM

## 2020-12-09 NOTE — Patient Instructions (Signed)

## 2020-12-20 ENCOUNTER — Encounter: Payer: Self-pay | Admitting: Dermatology

## 2020-12-20 NOTE — Progress Notes (Signed)
   Follow-Up Visit   Subjective  Ricardo [name] is a 85 y.o. male who presents for the following: Skin Problem (Check spot on left side cheek. Patient says its growing. ).  Several spots on face Location:  Duration:  Quality:  Associated Signs/Symptoms: Modifying Factors:  Severity:  Timing: Context:   Objective  Well appearing patient in no apparent distress; mood and affect are within normal limits. Objective  Right Buccal Cheek: 4Millimeters gritty pink crust  Objective  Left Buccal Cheek: Hornlike 1 cm waxy crust.  Probable superficial SCCA.  8.0 to top of ear junction 7.5 to bottom       A focused examination was performed including Head and neck.. Relevant physical exam findings are noted in the Assessment and Plan.   Assessment & Plan    AK (actinic keratosis) Right Buccal Cheek  Destruction of lesion - Right Buccal Cheek Complexity: simple   Destruction method: cryotherapy   Informed consent: discussed and consent obtained   Lesion destroyed using liquid nitrogen: Yes   Cryotherapy cycles:  3 Outcome: patient tolerated procedure well with no complications    Neoplasm of uncertain behavior of skin Left Buccal Cheek  Skin / nail biopsy Type of biopsy: tangential   Informed consent: discussed and consent obtained   Timeout: patient name, date of birth, surgical site, and procedure verified   Anesthesia: the lesion was anesthetized in a standard fashion   Anesthetic:  1% lidocaine w/ epinephrine 1-100,000 local infiltration Instrument used: flexible razor blade   Hemostasis achieved with: ferric subsulfate   Outcome: patient tolerated procedure well   Post-procedure details: sterile dressing applied and wound care instructions given   Dressing type: bandage and petrolatum    Destruction of lesion Complexity: simple   Destruction method: electrodesiccation and curettage   Informed consent: discussed and consent obtained   Timeout:  patient name, date  of birth, surgical site, and procedure verified Anesthesia: the lesion was anesthetized in a standard fashion   Anesthetic:  1% lidocaine w/ epinephrine 1-100,000 local infiltration Curettage performed in three different directions: Yes   Electrodesiccation performed over the curetted area: Yes   Curettage cycles:  1 Final wound size (cm):  1 Hemostasis achieved with:  ferric subsulfate Outcome: patient tolerated procedure well with no complications   Post-procedure details: sterile dressing applied and wound care instructions given   Dressing type: bandage and petrolatum   Additional details:  Wound inoculated with 5% Fluorouracil.    Specimen 1 - Surgical pathology Differential Diagnosis: scc vs bcc possible reoccurrence  Check Margins: No  After shave biopsy the base was treated with curettage plus light electrocautery      I, Lavonna Monarch, MD, have reviewed all documentation for this visit.  The documentation on 12/20/20 for the exam, diagnosis, procedures, and orders are all accurate and complete.

## 2021-01-01 DIAGNOSIS — N1831 Chronic kidney disease, stage 3a: Secondary | ICD-10-CM | POA: Diagnosis not present

## 2021-01-01 DIAGNOSIS — I5022 Chronic systolic (congestive) heart failure: Secondary | ICD-10-CM | POA: Diagnosis not present

## 2021-01-01 DIAGNOSIS — I482 Chronic atrial fibrillation, unspecified: Secondary | ICD-10-CM | POA: Diagnosis not present

## 2021-01-01 DIAGNOSIS — M7918 Myalgia, other site: Secondary | ICD-10-CM | POA: Diagnosis not present

## 2021-01-01 DIAGNOSIS — E790 Hyperuricemia without signs of inflammatory arthritis and tophaceous disease: Secondary | ICD-10-CM | POA: Diagnosis not present

## 2021-01-01 DIAGNOSIS — E785 Hyperlipidemia, unspecified: Secondary | ICD-10-CM | POA: Diagnosis not present

## 2021-01-01 DIAGNOSIS — E118 Type 2 diabetes mellitus with unspecified complications: Secondary | ICD-10-CM | POA: Diagnosis not present

## 2021-01-01 DIAGNOSIS — Z23 Encounter for immunization: Secondary | ICD-10-CM | POA: Diagnosis not present

## 2021-01-01 DIAGNOSIS — E1165 Type 2 diabetes mellitus with hyperglycemia: Secondary | ICD-10-CM | POA: Diagnosis not present

## 2021-01-01 DIAGNOSIS — I480 Paroxysmal atrial fibrillation: Secondary | ICD-10-CM | POA: Diagnosis not present

## 2021-01-01 DIAGNOSIS — Z0001 Encounter for general adult medical examination with abnormal findings: Secondary | ICD-10-CM | POA: Diagnosis not present

## 2021-01-01 DIAGNOSIS — I10 Essential (primary) hypertension: Secondary | ICD-10-CM | POA: Diagnosis not present

## 2021-01-01 DIAGNOSIS — H9193 Unspecified hearing loss, bilateral: Secondary | ICD-10-CM | POA: Diagnosis not present

## 2021-01-06 DIAGNOSIS — Z961 Presence of intraocular lens: Secondary | ICD-10-CM | POA: Diagnosis not present

## 2021-01-06 DIAGNOSIS — H35363 Drusen (degenerative) of macula, bilateral: Secondary | ICD-10-CM | POA: Diagnosis not present

## 2021-01-07 DIAGNOSIS — Z23 Encounter for immunization: Secondary | ICD-10-CM | POA: Diagnosis not present

## 2021-01-25 ENCOUNTER — Other Ambulatory Visit: Payer: Self-pay

## 2021-01-25 NOTE — Telephone Encounter (Signed)
This is a CHF pt 

## 2021-01-27 ENCOUNTER — Other Ambulatory Visit (HOSPITAL_COMMUNITY): Payer: Self-pay | Admitting: *Deleted

## 2021-01-27 MED ORDER — DIGOXIN 125 MCG PO TABS: 62.5000 ug | ORAL_TABLET | Freq: Every day | ORAL | 3 refills | Status: DC

## 2021-02-16 ENCOUNTER — Other Ambulatory Visit (HOSPITAL_COMMUNITY): Payer: Self-pay | Admitting: Internal Medicine

## 2021-02-22 ENCOUNTER — Other Ambulatory Visit: Payer: Self-pay

## 2021-02-22 ENCOUNTER — Ambulatory Visit (HOSPITAL_COMMUNITY)
Admission: RE | Admit: 2021-02-22 | Discharge: 2021-02-22 | Disposition: A | Discharge status: Home / Self Care | Payer: Medicare Other | Source: Ambulatory Visit | Attending: Internal Medicine | Admitting: Internal Medicine

## 2021-02-22 VITALS — BP 144/72 | HR 47 | Ht 66.0 in | Wt 142.8 lb

## 2021-02-22 DIAGNOSIS — I5022 Chronic systolic (congestive) heart failure: Secondary | ICD-10-CM | POA: Diagnosis not present

## 2021-02-22 DIAGNOSIS — I2510 Atherosclerotic heart disease of native coronary artery without angina pectoris: Secondary | ICD-10-CM

## 2021-02-22 DIAGNOSIS — I350 Nonrheumatic aortic (valve) stenosis: Secondary | ICD-10-CM | POA: Diagnosis not present

## 2021-02-22 DIAGNOSIS — I482 Chronic atrial fibrillation, unspecified: Secondary | ICD-10-CM

## 2021-02-22 MED ORDER — METOPROLOL SUCCINATE ER 25 MG PO TB24: 25.0000 mg | ORAL_TABLET | Freq: Every day | ORAL | Status: DC

## 2021-02-22 MED ORDER — ROSUVASTATIN CALCIUM 5 MG PO TABS: 5.0000 mg | ORAL_TABLET | Freq: Every day | ORAL | 3 refills | Status: AC

## 2021-02-22 NOTE — Progress Notes (Signed)
Advanced Heart Failure Clinic Note  Date:  02/22/2021   ID:  Odette Horns, DOB 1936-07-03, MRN [id number]  Location: Home  Provider location: [place] [facility] Type of Visit: Established patient  PCP:  Default, Provider, MD  Cardiologist:  None Primary HF: Ricardo [name]  Chief Complaint: Heart Failure follow-up   History of Present Illness:  Ricardo [name] is a 85 y.o. male  with h/o HTN, diabetes, CAD and chronic atrial fibrillation.   Previously followed by Dr. Ola Spurr at [facility]. He is s/p previous AFL ablation. Echo 12/13 EF 40-45%. Had sleep study in past and placed on CPAP. He was subsequently told OSA was mild and CPAP stopped.    In January 2018 began to develop HF symptoms. AF rate elevated. Echo 40-45% with moderate RV dysfunction. Mild AS. Diuresed 9 pounds and placed on metoprolol and digoxin for HR control. Weight on d/c 145.   Cath 4/18 Very heavily calcified coronary arteries with high grade lesion in moderate-sized OM1 and tandem 70% and 80% lesions in mid LAD. Distal PDA 90%. Treated medically.    Here is here for f/u. Doing well. Still working full-time. Riding 1-3 miles on the exercise bike over 20 minutes. Feels good. No change in exercise tolerance. No CP or undue SOB. No problems with meds.    cMRI 1/19   1) Mild LVE with moderate LVH Diffuse hypokinesis worse in the inferolateral wall. Quantitative EF 43% 2) SEMI seen in basal anterolateral and inferolateral walls as well as mid inferolateral wall 3) Severe LAE Moderate RAE 4) PFO present 5) Moderate appearing MR 6) Tri- leaflet AV with some thickening and restricted motion suggest echo correlation   Echo 11/06/16 LVEF 40%, Mild/Mod MR, Severe LAE, Severe RAE, + Patent foramen ovale   Cath 11/15/16 Very heavily calcified coronary arteries with high grade lesion in moderate-sized OM1 and tandem 70% and 80% lesions in mid LAD. Distal PDA 90%. Treated medically.    RA =  6 RV =  48/6 PA = 48/19 (29) PCW = 17 Ao = 135/89 (108) LV = 147/14 Fick cardiac output/index = 3.8/2.2 PVR = 3.2 SVR = 2131 FA sat = 98% PA sat = 67%. 69%   Past Medical History:  Diagnosis Date   Atrial fibrillation ([place])    on coumadin.  cardiologist is in [facility] dr [name].  S/P multiple DCCVs and prior ablation.    Basal cell carcinoma 12/23/2014   nod-left forearm (CX35FU)   BPH (benign prostatic hyperplasia)    by ST in 2000.    CHF (congestive heart failure) ([facility])    Complication of anesthesia    " sometimes slow to wake up"   Diabetes mellitus without complication ([place])    Diverticulitis    question if he ever had imaging confirmed diverticulitis   GERD (gastroesophageal reflux disease)    GI bleed    Gout    Hypertension    Kidney stones    treated with lithotripsy    Pneumonia    PONV (postoperative nausea and vomiting)    Rupture of biceps tendon 2006   left distal bicep tendon   SCCA (squamous cell carcinoma) of skin 01/29/2020   Left Malar Cheek (in situ)   Squamous cell carcinoma of skin 04/08/2019   in situ-left cheek-txpbx   UTI (urinary tract infection)    Past Surgical History:  Procedure Laterality Date   CARDIOVERSION     CATARACT EXTRACTION, BILATERAL  CHOLECYSTECTOMY     COLONOSCOPY  04/18/2012   Procedure: COLONOSCOPY;  Surgeon: [name], MD;  Location: [place];  Service: Endoscopy;  Laterality: N/A;   DISTAL BICEPS TENDON REPAIR  2006   dr [name]   HEMORRHOID SURGERY     twice   HERNIA REPAIR     LITHOTRIPSY     of kidney stones.    PALATE / UVULA BIOPSY / EXCISION     uvula excision   RIGHT/LEFT HEART CATH AND CORONARY ANGIOGRAPHY N/A 11/15/2016   Procedure: Right/Left Heart Cath and Coronary Angiography;  Surgeon: [name], MD;  Location: [place] [facility];  Service: Cardiovascular;  Laterality: N/A;   thyroid nodule excision       Current Outpatient Medications  Medication Sig Dispense Refill    allopurinol (ZYLOPRIM) 100 MG tablet allopurinol 100 mg tablet  TAKE 1 TABLET BY MOUTH 3 TIMES A WEEK     b complex vitamins tablet Take 1 tablet by mouth daily.     Cinnamon 500 MG capsule Take 1,000 mg by mouth daily.     Coenzyme Q10 (CO Q10) 200 MG CAPS Take 200 mcg by mouth daily.     cyanocobalamin 100 MCG tablet Take 1,000 mcg by mouth daily.     digoxin (LANOXIN) 0.125 MG tablet Take 0.5 tablets (62.5 mcg total) by mouth daily. 45 tablet 3   ELIQUIS 2.5 MG TABS tablet TAKE 1 TABLET(2.5 MG) BY MOUTH TWICE DAILY 60 tablet 11   empagliflozin (JARDIANCE) 10 MG TABS tablet Take 10 mg by mouth daily.     ENTRESTO 24-26 MG TAKE 1 TABLET BY MOUTH TWICE DAILY 180 tablet 0   fluticasone (FLONASE) 50 MCG/ACT nasal spray Place 1 spray into both nostrils at bedtime.     ipratropium (ATROVENT) 0.03 % nasal spray ipratropium bromide 21 mcg (0.03 %) nasal spray     LINZESS 72 MCG capsule Take 72 mcg by mouth as needed.     loratadine (CLARITIN) 10 MG tablet Claritin 10 mg tablet  Take 1 tablet every day by oral route in the evening for 14 days.     metoprolol succinate (TOPROL-XL) 50 MG 24 hr tablet TAKE 1 TABLET(50 MG) BY MOUTH DAILY WITH OR IMMEDIATELY FOLLOWING A MEAL 90 tablet 0   potassium chloride SA (KLOR-CON) 20 MEQ tablet TAKE 2 TABLETS(40 MEQ) BY MOUTH DAILY 90 tablet 0   torsemide (DEMADEX) 20 MG tablet Take 10 mg by mouth daily.     vitamin C (ASCORBIC ACID) 250 MG tablet Take 250 mg by mouth daily.     colchicine 0.6 MG tablet Take 0.6 mg by mouth 2 (two) times daily. (Patient not taking: Reported on 02/22/2021)     No current facility-administered medications for this encounter.    Allergies:   Codeine and Tape   Social History:  The patient  reports that he has never smoked. He has never used smokeless tobacco. He reports current alcohol use. He reports that he does not use drugs.   Family History:  The patient's family history includes Congestive Heart Failure in his father; Stroke  in his father; Tuberculosis in his mother.   ROS:  Please see the history of present illness.   All other systems are personally reviewed and negative.   Vitals:   02/22/21 1455  BP: (!) 144/72  Pulse: (!) 47  SpO2: 97%  Weight: 64.8 kg (142 lb 12.8 oz)  Height: 5\' 6"  (1.676 m)    Exam:  General:  Well appearing. No resp difficulty HEENT: normal Neck: supple. no JVD. Carotids 2+ bilat; no bruits. No lymphadenopathy or thryomegaly appreciated. Cor: PMI nondisplaced. Regular brady 2/6 AS Lungs: clear Abdomen: soft, nontender, nondistended. No hepatosplenomegaly. No bruits or masses. Good bowel sounds. Extremities: no cyanosis, clubbing, rash, edema Neuro: alert & orientedx3, cranial nerves grossly intact. moves all 4 extremities w/o difficulty. Affect pleasant    Recent Labs: No results found for requested labs within last 8760 hours.  Personally reviewed   Wt Readings from Last 3 Encounters:  02/22/21 64.8 kg (142 lb 12.8 oz)  05/06/20 63.7 kg (140 lb 6.4 oz)  10/19/18 68 kg (150 lb)      ASSESSMENT AND PLAN:  1. Chronic systolic HF - Echo 0/88/11 EF 40-45% with moderate RV dysfunction. Seems out of proportion to CAD. ? PVC related with 10% PVCs on Holter 9/18. - SPEP negative. CMRI 2019 no infiltrative process EF 43% - Echo 9/21 EF 40-45% RV mildly down  AoV sclerosis without stenosis.  - Continues to do very well.  NYHA I - Volume status looks good on torsemide 10 mg daily - HR low Continue Toprol 50mg  daily. HR too low to titrate - Stop digoxin  - Continue entresto to 49-51 mg twice a day. Have not titrated due to intermittent lightheadedness.  - Unable to tolerate spiro due to severe gynecomastia   2. Chronic AF with slow VR - Previously with RVR.  - HR typically in 50- 60s. He is in 75s today on checkin  - CHADSVASC = 5 (age, DM, HTN, HF) - Continue Eliquis 2.5 bid (age > 52, last creatinine 1.45). Minor epistaxis at times. Has had cauterization in past.  -  stop digoxin   3. Mild aortic stenosis - AoV heavily calcified on Echo 9/21. Minimal/mild AS  4. HTN  - Blood pressure well controlled. Continue current regimen.   5. DM2 - Per PCP. Now on Jardiance   6. Suspected sleep apnea.  - Sleep study was negative.     7. CAD - He has highly calcified coronary arteries with borderline lesion in mLAD.  - No signs or symptoms of ischemia. Continue medical therapy - Restart Crestor      Signed, Glori Bickers, MD  02/22/2021 3:37 PM  [facility] [place] [address] [facility] [place] [address] [phone] (office) [phone] (fax)

## 2021-02-22 NOTE — Patient Instructions (Signed)
Stop Digoxin  Restart Crestor 5 mg Daily  Please call our office in December to schedule your follow up appointment and echocardiogram  If you have any questions or concerns before your next appointment please send [place] a message through [place] or call our office at [phone].    TO LEAVE A MESSAGE FOR THE NURSE SELECT OPTION 2, PLEASE LEAVE A MESSAGE INCLUDING: YOUR NAME DATE OF BIRTH CALL BACK NUMBER REASON FOR CALL**this is important as we prioritize the call backs  YOU WILL RECEIVE A CALL BACK THE SAME DAY AS LONG AS YOU CALL BEFORE 4:00 PM

## 2021-03-01 ENCOUNTER — Other Ambulatory Visit: Payer: Self-pay

## 2021-03-01 ENCOUNTER — Ambulatory Visit (INDEPENDENT_AMBULATORY_CARE_PROVIDER_SITE_OTHER): Payer: Medicare Other | Admitting: Dermatology

## 2021-03-01 DIAGNOSIS — L570 Actinic keratosis: Secondary | ICD-10-CM | POA: Diagnosis not present

## 2021-03-01 DIAGNOSIS — D485 Neoplasm of uncertain behavior of skin: Secondary | ICD-10-CM

## 2021-03-01 DIAGNOSIS — C44612 Basal cell carcinoma of skin of right upper limb, including shoulder: Secondary | ICD-10-CM | POA: Diagnosis not present

## 2021-03-01 NOTE — Patient Instructions (Signed)

## 2021-03-24 ENCOUNTER — Encounter: Payer: Self-pay | Admitting: Dermatology

## 2021-03-24 NOTE — Progress Notes (Signed)
   Follow-Up Visit   Subjective  Ricardo [name] is a 85 y.o. male who presents for the following: Skin Problem (Patient here today for follow up on right forearm x unsure. Per patient he thought it would go away and it hasn't. ).  Spot on right arm has grown, also new crust on chest Location:  Duration:  Quality:  Associated Signs/Symptoms: Modifying Factors:  Severity:  Timing: Context:   Objective  Well appearing patient in no apparent distress; mood and affect are within normal limits. Chest - Medial (Center) 3 mm pink gritty crust  Right Forearm - Anterior Small pink crust which patient felt was inflammatory  has grown and now resembles a superficial carcinoma.         All skin waist up examined.   Assessment & Plan    AK (actinic keratosis) Chest - Medial (Center)  Destruction of lesion - Chest - Medial (Center) Complexity: simple   Destruction method: cryotherapy   Informed consent: discussed and consent obtained   Timeout:  patient name, date of birth, surgical site, and procedure verified Lesion destroyed using liquid nitrogen: Yes   Cryotherapy cycles:  3 Outcome: patient tolerated procedure well with no complications   Post-procedure details: wound care instructions given    Neoplasm of uncertain behavior of skin Right Forearm - Anterior  Skin / nail biopsy Type of biopsy: tangential   Informed consent: discussed and consent obtained   Timeout: patient name, date of birth, surgical site, and procedure verified   Procedure prep:  Patient was prepped and draped in usual sterile fashion (Non sterile) Prep type:  Chlorhexidine Anesthesia: the lesion was anesthetized in a standard fashion   Anesthetic:  1% lidocaine w/ epinephrine 1-100,000 local infiltration Instrument used: flexible razor blade   Hemostasis achieved with: ferric subsulfate   Outcome: patient tolerated procedure well   Post-procedure details: sterile dressing applied and wound care  instructions given   Dressing type: bandage and petrolatum    Destruction of lesion Complexity: simple   Destruction method: electrodesiccation and curettage   Informed consent: discussed and consent obtained   Timeout:  patient name, date of birth, surgical site, and procedure verified Anesthesia: the lesion was anesthetized in a standard fashion   Anesthetic:  1% lidocaine w/ epinephrine 1-100,000 local infiltration Curettage performed in three different directions: Yes   Electrodesiccation performed over the curetted area: Yes   Curettage cycles:  1 Lesion length (cm):  1.5 Lesion width (cm):  1.5 Margin per side (cm):  0 Final wound size (cm):  1.5 Hemostasis achieved with:  ferric subsulfate and electrodesiccation Outcome: patient tolerated procedure well with no complications   Post-procedure details: sterile dressing applied and wound care instructions given   Dressing type: bandage and petrolatum    Specimen 1 - Surgical pathology Differential Diagnosis: R/O BCC vs SCC - treated after biopsy  Check Margins: No  After shave biopsy the base was treated with curettage plus cautery.      I, Lavonna Monarch, MD, have reviewed all documentation for this visit.  The documentation on 03/24/21 for the exam, diagnosis, procedures, and orders are all accurate and complete.

## 2021-04-05 DIAGNOSIS — J019 Acute sinusitis, unspecified: Secondary | ICD-10-CM | POA: Diagnosis not present

## 2021-04-14 DIAGNOSIS — H35363 Drusen (degenerative) of macula, bilateral: Secondary | ICD-10-CM | POA: Diagnosis not present

## 2021-04-14 DIAGNOSIS — Z961 Presence of intraocular lens: Secondary | ICD-10-CM | POA: Diagnosis not present

## 2021-04-14 DIAGNOSIS — H43812 Vitreous degeneration, left eye: Secondary | ICD-10-CM | POA: Diagnosis not present

## 2021-05-05 DIAGNOSIS — H9193 Unspecified hearing loss, bilateral: Secondary | ICD-10-CM | POA: Diagnosis not present

## 2021-05-05 DIAGNOSIS — E785 Hyperlipidemia, unspecified: Secondary | ICD-10-CM | POA: Diagnosis not present

## 2021-05-05 DIAGNOSIS — E1165 Type 2 diabetes mellitus with hyperglycemia: Secondary | ICD-10-CM | POA: Diagnosis not present

## 2021-05-05 DIAGNOSIS — N1831 Chronic kidney disease, stage 3a: Secondary | ICD-10-CM | POA: Diagnosis not present

## 2021-05-05 DIAGNOSIS — E790 Hyperuricemia without signs of inflammatory arthritis and tophaceous disease: Secondary | ICD-10-CM | POA: Diagnosis not present

## 2021-05-05 DIAGNOSIS — I739 Peripheral vascular disease, unspecified: Secondary | ICD-10-CM | POA: Diagnosis not present

## 2021-05-05 DIAGNOSIS — R413 Other amnesia: Secondary | ICD-10-CM | POA: Diagnosis not present

## 2021-05-06 DIAGNOSIS — Z23 Encounter for immunization: Secondary | ICD-10-CM | POA: Diagnosis not present

## 2021-05-12 ENCOUNTER — Other Ambulatory Visit (HOSPITAL_COMMUNITY): Payer: Self-pay | Admitting: Internal Medicine

## 2021-06-17 ENCOUNTER — Other Ambulatory Visit: Payer: Self-pay | Admitting: Internal Medicine

## 2021-06-18 ENCOUNTER — Other Ambulatory Visit (HOSPITAL_COMMUNITY): Payer: Self-pay

## 2021-06-18 MED ORDER — ENTRESTO 24-26 MG PO TABS: 1.0000 | ORAL_TABLET | Freq: Two times a day (BID) | ORAL | 0 refills | Status: DC

## 2021-07-02 ENCOUNTER — Other Ambulatory Visit (HOSPITAL_COMMUNITY): Payer: Self-pay

## 2021-07-02 MED ORDER — POTASSIUM CHLORIDE CRYS ER 20 MEQ PO TBCR: EXTENDED_RELEASE_TABLET | ORAL | 0 refills | Status: DC

## 2021-07-12 DIAGNOSIS — Z20822 Contact with and (suspected) exposure to covid-19: Secondary | ICD-10-CM | POA: Diagnosis not present

## 2021-07-31 ENCOUNTER — Other Ambulatory Visit (HOSPITAL_COMMUNITY): Payer: Self-pay | Admitting: Internal Medicine

## 2021-08-16 ENCOUNTER — Other Ambulatory Visit (HOSPITAL_COMMUNITY): Payer: Self-pay | Admitting: Internal Medicine

## 2021-09-14 ENCOUNTER — Encounter (HOSPITAL_COMMUNITY): Payer: Medicare Other | Admitting: Internal Medicine

## 2021-09-14 ENCOUNTER — Other Ambulatory Visit (HOSPITAL_COMMUNITY): Payer: Medicare Other

## 2021-09-15 ENCOUNTER — Other Ambulatory Visit (HOSPITAL_COMMUNITY): Payer: Self-pay | Admitting: Internal Medicine

## 2021-09-27 DIAGNOSIS — N1831 Chronic kidney disease, stage 3a: Secondary | ICD-10-CM | POA: Diagnosis not present

## 2021-09-28 DIAGNOSIS — I10 Essential (primary) hypertension: Secondary | ICD-10-CM | POA: Diagnosis not present

## 2021-09-28 DIAGNOSIS — E785 Hyperlipidemia, unspecified: Secondary | ICD-10-CM | POA: Diagnosis not present

## 2021-09-28 DIAGNOSIS — B351 Tinea unguium: Secondary | ICD-10-CM | POA: Diagnosis not present

## 2021-09-28 DIAGNOSIS — E1165 Type 2 diabetes mellitus with hyperglycemia: Secondary | ICD-10-CM | POA: Diagnosis not present

## 2021-09-28 DIAGNOSIS — I739 Peripheral vascular disease, unspecified: Secondary | ICD-10-CM | POA: Diagnosis not present

## 2021-09-28 DIAGNOSIS — R413 Other amnesia: Secondary | ICD-10-CM | POA: Diagnosis not present

## 2021-10-02 ENCOUNTER — Other Ambulatory Visit (HOSPITAL_COMMUNITY): Payer: Self-pay | Admitting: Internal Medicine

## 2021-10-05 DIAGNOSIS — I129 Hypertensive chronic kidney disease with stage 1 through stage 4 chronic kidney disease, or unspecified chronic kidney disease: Secondary | ICD-10-CM | POA: Diagnosis not present

## 2021-10-05 DIAGNOSIS — D631 Anemia in chronic kidney disease: Secondary | ICD-10-CM | POA: Diagnosis not present

## 2021-10-05 DIAGNOSIS — N1831 Chronic kidney disease, stage 3a: Secondary | ICD-10-CM | POA: Diagnosis not present

## 2021-10-05 DIAGNOSIS — N2581 Secondary hyperparathyroidism of renal origin: Secondary | ICD-10-CM | POA: Diagnosis not present

## 2021-10-13 DIAGNOSIS — H353121 Nonexudative age-related macular degeneration, left eye, early dry stage: Secondary | ICD-10-CM | POA: Diagnosis not present

## 2021-10-13 DIAGNOSIS — H353212 Exudative age-related macular degeneration, right eye, with inactive choroidal neovascularization: Secondary | ICD-10-CM | POA: Diagnosis not present

## 2021-10-15 ENCOUNTER — Other Ambulatory Visit (HOSPITAL_COMMUNITY): Payer: Self-pay | Admitting: Internal Medicine

## 2021-10-20 DIAGNOSIS — E103393 Type 1 diabetes mellitus with moderate nonproliferative diabetic retinopathy without macular edema, bilateral: Secondary | ICD-10-CM | POA: Diagnosis not present

## 2021-10-20 DIAGNOSIS — H353132 Nonexudative age-related macular degeneration, bilateral, intermediate dry stage: Secondary | ICD-10-CM | POA: Diagnosis not present

## 2021-11-02 ENCOUNTER — Other Ambulatory Visit (HOSPITAL_COMMUNITY): Payer: Medicare Other

## 2021-11-02 ENCOUNTER — Encounter (HOSPITAL_COMMUNITY): Payer: Medicare Other | Admitting: Internal Medicine

## 2021-11-14 ENCOUNTER — Other Ambulatory Visit (HOSPITAL_COMMUNITY): Payer: Self-pay | Admitting: Internal Medicine

## 2021-11-15 DIAGNOSIS — I5022 Chronic systolic (congestive) heart failure: Secondary | ICD-10-CM | POA: Diagnosis not present

## 2021-11-15 DIAGNOSIS — R634 Abnormal weight loss: Secondary | ICD-10-CM | POA: Diagnosis not present

## 2021-11-15 DIAGNOSIS — E1165 Type 2 diabetes mellitus with hyperglycemia: Secondary | ICD-10-CM | POA: Diagnosis not present

## 2021-11-15 DIAGNOSIS — E785 Hyperlipidemia, unspecified: Secondary | ICD-10-CM | POA: Diagnosis not present

## 2021-11-15 DIAGNOSIS — B351 Tinea unguium: Secondary | ICD-10-CM | POA: Diagnosis not present

## 2021-11-15 DIAGNOSIS — I10 Essential (primary) hypertension: Secondary | ICD-10-CM | POA: Diagnosis not present

## 2021-11-15 DIAGNOSIS — H3530 Unspecified macular degeneration: Secondary | ICD-10-CM | POA: Diagnosis not present

## 2021-11-15 DIAGNOSIS — E113399 Type 2 diabetes mellitus with moderate nonproliferative diabetic retinopathy without macular edema, unspecified eye: Secondary | ICD-10-CM | POA: Diagnosis not present

## 2021-11-15 DIAGNOSIS — R413 Other amnesia: Secondary | ICD-10-CM | POA: Diagnosis not present

## 2021-11-15 DIAGNOSIS — E790 Hyperuricemia without signs of inflammatory arthritis and tophaceous disease: Secondary | ICD-10-CM | POA: Diagnosis not present

## 2021-11-15 DIAGNOSIS — M7918 Myalgia, other site: Secondary | ICD-10-CM | POA: Diagnosis not present

## 2021-11-15 DIAGNOSIS — I739 Peripheral vascular disease, unspecified: Secondary | ICD-10-CM | POA: Diagnosis not present

## 2021-11-17 ENCOUNTER — Ambulatory Visit (HOSPITAL_BASED_OUTPATIENT_CLINIC_OR_DEPARTMENT_OTHER)
Admission: RE | Admit: 2021-11-17 | Discharge: 2021-11-17 | Disposition: A | Discharge status: Home / Self Care | Payer: Medicare Other | Source: Ambulatory Visit | Attending: Internal Medicine | Admitting: Internal Medicine

## 2021-11-17 ENCOUNTER — Encounter (HOSPITAL_COMMUNITY): Payer: Self-pay | Admitting: Internal Medicine

## 2021-11-17 ENCOUNTER — Ambulatory Visit (HOSPITAL_COMMUNITY)
Admission: RE | Admit: 2021-11-17 | Discharge: 2021-11-17 | Disposition: A | Discharge status: Home / Self Care | Payer: Medicare Other | Source: Ambulatory Visit | Attending: Internal Medicine | Admitting: Internal Medicine

## 2021-11-17 VITALS — BP 130/72 | HR 92 | Wt 139.6 lb

## 2021-11-17 DIAGNOSIS — Z79899 Other long term (current) drug therapy: Secondary | ICD-10-CM | POA: Diagnosis not present

## 2021-11-17 DIAGNOSIS — I482 Chronic atrial fibrillation, unspecified: Secondary | ICD-10-CM

## 2021-11-17 DIAGNOSIS — Z7901 Long term (current) use of anticoagulants: Secondary | ICD-10-CM | POA: Insufficient documentation

## 2021-11-17 DIAGNOSIS — I5022 Chronic systolic (congestive) heart failure: Secondary | ICD-10-CM | POA: Diagnosis not present

## 2021-11-17 DIAGNOSIS — I350 Nonrheumatic aortic (valve) stenosis: Secondary | ICD-10-CM

## 2021-11-17 DIAGNOSIS — G4733 Obstructive sleep apnea (adult) (pediatric): Secondary | ICD-10-CM | POA: Diagnosis not present

## 2021-11-17 DIAGNOSIS — R42 Dizziness and giddiness: Secondary | ICD-10-CM | POA: Diagnosis not present

## 2021-11-17 DIAGNOSIS — I2510 Atherosclerotic heart disease of native coronary artery without angina pectoris: Secondary | ICD-10-CM

## 2021-11-17 DIAGNOSIS — E119 Type 2 diabetes mellitus without complications: Secondary | ICD-10-CM | POA: Insufficient documentation

## 2021-11-17 DIAGNOSIS — Q2112 Patent foramen ovale: Secondary | ICD-10-CM | POA: Diagnosis not present

## 2021-11-17 DIAGNOSIS — I110 Hypertensive heart disease with heart failure: Secondary | ICD-10-CM | POA: Diagnosis not present

## 2021-11-17 DIAGNOSIS — Z7984 Long term (current) use of oral hypoglycemic drugs: Secondary | ICD-10-CM | POA: Diagnosis not present

## 2021-11-17 DIAGNOSIS — I083 Combined rheumatic disorders of mitral, aortic and tricuspid valves: Secondary | ICD-10-CM | POA: Insufficient documentation

## 2021-11-17 LAB — ECHOCARDIOGRAM COMPLETE
AR max vel: 1.46 cm2
AV Area VTI: 1.49 cm2
AV Area mean vel: 1.47 cm2
AV Mean grad: 13.7 mmHg
AV Peak grad: 28.6 mmHg
Ao pk vel: 2.67 m/s
Calc EF: 46.5 %
MV M vel: 5.4 m/s
MV Peak grad: 116.6 mmHg
Radius: 0.4 cm
S' Lateral: 3.9 cm
Single Plane A2C EF: 49.9 %
Single Plane A4C EF: 43.8 %

## 2021-11-17 MED ORDER — EPLERENONE 25 MG PO TABS: 12.5000 mg | ORAL_TABLET | Freq: Every day | ORAL | 6 refills | Status: DC

## 2021-11-17 NOTE — Patient Instructions (Signed)
Good to see you today ? ?STOP Digoxin ? ?Eplerenone12.5 mg (1/2 tablet) daily ? ?Lab work in 2 weeks prescrption given  ? ?Your physician recommends that you schedule a follow-up appointment in: 31month Call in August to schedule appointment ? ?If you have any questions or concerns before your next appointment please send [place] message through [place]or call our office at [phone]   ? ?TO LEAVE A MESSAGE FOR THE NURSE SELECT OPTION 2, PLEASE LEAVE A MESSAGE INCLUDING: ?YOUR NAME ?DATE OF BIRTH ?CALL BACK NUMBER ?REASON FOR CALL**this is important as we prioritize the call backs ? ?YOU WILL RECEIVE A CALL BACK THE SAME DAY AS LONG AS YOU CALL BEFORE 4:00 PM ? At the [facility] you and your health needs are our priority. As part of our continuing mission to provide you with exceptional heart care, we have created designated Provider Care Teams. These Care Teams include your primary Cardiologist (physician) and Advanced Practice Providers (APPs- Physician Assistants and Nurse Practitioners) who all work together to provide you with the care you need, when you need it.  ? ?You may see any of the following providers on your designated Care Team at your next follow up: ?Dr DGlori Bickers?Dr DLoralie Champagne?ADarrick Grinder NP ?BLyda Jester PA ?Jessica Milford,NP ?LMarlyce Huge PA ?LAudry Riles PharmD ? ? ?Please be sure to bring in all your medications bottles to every appointment.  ? ? ? ? ?

## 2021-11-17 NOTE — Progress Notes (Signed)
?  ? ?Advanced Heart Failure Clinic Note ? ?Date:  11/17/2021  ? ?ID:  Ricardo [name], DOB 11-21-1935, MRN [id number]  Location: Home  ?Provider location: [facility] ?Type of Visit: Established patient ? ?PCP:  Pcp, No  ?Cardiologist:  None ?Primary HF: [name] ? ?Chief Complaint: Heart Failure follow-up ?  ?History of Present Illness: ? ?Ricardo [name] is a 86y.o. male  with h/o HTN, diabetes, CAD and chronic atrial fibrillation. ?  ?Previously followed by Dr. Ola Spurr at [facility]. He is s/p previous AFL ablation. Echo 12/13 EF 40-45%. Had sleep study in past and placed on CPAP. He was subsequently told OSA was mild and CPAP stopped.  ?  ?In January 2018 began to develop HF symptoms. AF rate elevated. Echo 40-45% with moderate RV dysfunction. Mild AS. Diuresed 9 pounds and placed on metoprolol and digoxin for HR control. Weight on d/c 145. ?  ?Cath 4/18 Very heavily calcified coronary arteries with high grade lesion in moderate-sized OM1 and tandem 70% and 80% lesions in mid LAD. Distal PDA 90%. Treated medically.  ?  ?Here is here for f/u. Doing well. Still working full-time. Riding 3 miles on the exercise bike over 18 minutes. Feels good. No SOB, orthopnea or PND. Taking torsemide '10mg'$  daily. Edema well controlled ? ?Echo today 11/17/21 EF 45% Moderate RV dysfunction Mild AS severe  biatrial enlargement ? ? ?cMRI 1/19 ?  ?1) Mild LVE with moderate LVH Diffuse hypokinesis worse in the ?inferolateral wall. Quantitative EF 43% ?2) SEMI seen in basal anterolateral and inferolateral walls as well ?as mid inferolateral wall ?3) Severe LAE Moderate RAE ?4) PFO present ?5) Moderate appearing MR ?6) Tri- leaflet AV with some thickening and restricted motion ?suggest echo correlation ?  ?Echo 11/06/16 LVEF 40%, Mild/Mod MR, Severe LAE, Severe RAE, + Patent foramen ovale ?  ?Cath 11/15/16 ?Very heavily calcified coronary arteries with high grade lesion in moderate-sized OM1 and tandem 70% and 80%  lesions in mid LAD. Distal PDA 90%. Treated medically.  ?  ?RA =  6 ?RV = 48/6 ?PA = 48/19 (29) ?PCW = 17 ?Ao = 135/89 (108) ?LV = 147/14 ?Fick cardiac output/index = 3.8/2.2 ?PVR = 3.2 ?SVR = 2131 ?FA sat = 98% ?PA sat = 67%. 69% ? ? ?Past Medical History:  ?Diagnosis Date  ? Atrial fibrillation ([name])   ? on coumadin.  cardiologist is in [facility] dr [name].  S/P multiple DCCVs and prior ablation.   ? Basal cell carcinoma 12/23/2014  ? nod-left forearm (CX35FU)  ? BPH (benign prostatic hyperplasia)   ? by ST in 2000.   ? CHF (congestive heart failure) ([name])   ? Complication of anesthesia   ? " sometimes slow to wake up"  ? Diabetes mellitus without complication ([name])   ? Diverticulitis   ? question if he ever had imaging confirmed diverticulitis  ? GERD (gastroesophageal reflux disease)   ? GI bleed   ? Gout   ? Hypertension   ? Kidney stones   ? treated with lithotripsy   ? Pneumonia   ? PONV (postoperative nausea and vomiting)   ? Rupture of biceps tendon 2006  ? left distal bicep tendon  ? SCCA (squamous cell carcinoma) of skin 01/29/2020  ? Left Malar Cheek (in situ)  ? Squamous cell carcinoma of skin 04/08/2019  ? in situ-left cheek-txpbx  ? UTI (urinary tract infection)   ? ?Past Surgical History:  ?Procedure Laterality Date  ? CARDIOVERSION    ?  CATARACT EXTRACTION, BILATERAL    ? CHOLECYSTECTOMY    ? COLONOSCOPY  04/18/2012  ? Procedure: COLONOSCOPY;  Surgeon: [name], MD;  Location: [place];  Service: Endoscopy;  Laterality: N/A;  ? DISTAL BICEPS TENDON REPAIR  2006  ? dr [name]  ? HEMORRHOID SURGERY    ? twice  ? HERNIA REPAIR    ? LITHOTRIPSY    ? of kidney stones.   ? PALATE / UVULA BIOPSY / EXCISION    ? uvula excision  ? RIGHT/LEFT HEART CATH AND CORONARY ANGIOGRAPHY N/A 11/15/2016  ? Procedure: Right/Left Heart Cath and Coronary Angiography;  Surgeon: [name], MD;  Location: [place] [facility];  Service: Cardiovascular;  Laterality: N/A;  ? thyroid nodule excision     ? ? ? ?Current Outpatient Medications  ?Medication Sig Dispense Refill  ? b complex vitamins tablet Take 1 tablet by mouth daily.    ? Cinnamon 500 MG capsule Take 1,000 mg by mouth daily.    ? Coenzyme Q10 (CO Q10) 200 MG CAPS Take 200 mcg by mouth daily.    ? cyanocobalamin 100 MCG tablet Take 1,000 mcg by mouth daily.    ? digoxin (LANOXIN) 0.125 MG tablet Take 0.0625 mg by mouth daily.    ? ELIQUIS 2.5 MG TABS tablet TAKE 1 TABLET(2.5 MG) BY MOUTH TWICE DAILY 60 tablet 11  ? empagliflozin (JARDIANCE) 10 MG TABS tablet Take 10 mg by mouth daily.    ? ENTRESTO 24-26 MG TAKE 1 TABLET BY MOUTH TWICE DAILY 60 tablet 0  ? fluticasone (FLONASE) 50 MCG/ACT nasal spray Place 1 spray into both nostrils at bedtime.    ? ipratropium (ATROVENT) 0.03 % nasal spray ipratropium bromide 21 mcg (0.03 %) nasal spray    ? loratadine (CLARITIN) 10 MG tablet Claritin 10 mg tablet ? Take 1 tablet every day by oral route in the evening for 14 days.    ? meclizine (ANTIVERT) 12.5 MG tablet Take 12.5 mg by mouth 3 (three) times daily as needed for dizziness.    ? metoprolol succinate (TOPROL-XL) 25 MG 24 hr tablet Take 1 tablet (25 mg total) by mouth daily. Take with or immediately following a meal.    ? potassium chloride SA (KLOR-CON M) 20 MEQ tablet TAKE 2 TABLETS BY MOUTH DAILY 168 tablet 0  ? torsemide (DEMADEX) 20 MG tablet Take 10 mg by mouth daily.    ? vitamin C (ASCORBIC ACID) 250 MG tablet Take 250 mg by mouth daily.    ? rosuvastatin (CRESTOR) 5 MG tablet Take 1 tablet (5 mg total) by mouth daily. 90 tablet 3  ? ?No current facility-administered medications for this encounter.  ? ? ?Allergies:   Codeine and Tape  ? ?Social History:  The patient  reports that he has never smoked. He has never used smokeless tobacco. He reports current alcohol use. He reports that he does not use drugs.  ? ?Family History:  The patient's family history includes Congestive Heart Failure in his father; Stroke in his father; Tuberculosis in his  mother.  ? ?ROS:  Please see the history of present illness.   All other systems are personally reviewed and negative.  ? ?Vitals:  ? 11/17/21 1434  ?BP: 130/72  ?Pulse: 92  ?SpO2: 99%  ?Weight: 63.3 kg (139 lb 9.6 oz)  ? ? ?Exam:  ?General:  Well appearing. No resp difficulty ?HEENT: normal ?Neck: supple. no JVD. Carotids 2+ bilat; no bruits. No lymphadenopathy or thryomegaly appreciated. ?  Cor: PMI nondisplaced. Irregular rate & rhythm. 2/6 AS  ?Lungs: clear ?Abdomen: soft, nontender, nondistended. No hepatosplenomegaly. No bruits or masses. Good bowel sounds. ?Extremities: no cyanosis, clubbing, rash, edema ?Neuro: alert & orientedx3, cranial nerves grossly intact. moves all 4 extremities w/o difficulty. Affect pleasant ? ? ?Recent Labs: ?No results found for requested labs within last 8760 hours.  ?Personally reviewed  ? ?Wt Readings from Last 3 Encounters:  ?11/17/21 63.3 kg (139 lb 9.6 oz)  ?02/22/21 64.8 kg (142 lb 12.8 oz)  ?05/06/20 63.7 kg (140 lb 6.4 oz)  ?  ? ? ?ASSESSMENT AND PLAN: ? ?1. Chronic systolic HF ?- Echo 4/31/54 EF 40-45% with moderate RV dysfunction. Seems out of proportion to CAD. ? PVC related with 10% PVCs on Holter 9/18. - SPEP negative. CMRI 2019 no infiltrative process EF 43% ?- Echo 9/21 EF 40-45% RV mildly down  AoV sclerosis without stenosis.  ?- Echo today 11/17/21 EF 45% Moderate RV dysfunction Mild AS severe  biatrial enlargement ?- Continues to do very well.  NYHA I-II ?- Volume status looks good on torsemide 10 mg daily ?- Continue Toprol '50mg'$  daily. HR too low to titrate ?- Stop digoxin  ?- Continue entresto to 49-51 mg twice a day. Have not titrated due to intermittent lightheadedness.  ?- Unable to tolerate spiro due to severe gynecomastia. Will add eplerenone 12.5. Watch BP. Check labs 1-2 weeks ?  ?2. Chronic AF with slow VR ?- Previously with RVR. Now with relatively slow VR ?- HR typically in 50- 60s.  ?- Stop digoxin ?- CHADSVASC = 5 (age, DM, HTN, HF) ?- Continue Eliquis  2.5 bid (age > 38, last creatinine 1.45, weight 63kg). ?  ?3. Mild aortic stenosis ?- Mild on echo today ? ?4. HTN  ?- Blood pressure well controlled. Plan as above ?  ?5. DM2 ?- Per PCP. Now on Jardiance ?

## 2021-11-17 NOTE — Progress Notes (Signed)
?  Echocardiogram ?2D Echocardiogram has been performed. ? ?Fidel Levy ?11/17/2021, 2:13 PM ?

## 2021-12-14 ENCOUNTER — Other Ambulatory Visit (HOSPITAL_COMMUNITY): Payer: Self-pay | Admitting: Internal Medicine

## 2021-12-21 ENCOUNTER — Other Ambulatory Visit (HOSPITAL_COMMUNITY): Payer: Self-pay | Admitting: Internal Medicine

## 2021-12-27 ENCOUNTER — Other Ambulatory Visit (HOSPITAL_COMMUNITY): Payer: Self-pay | Admitting: Internal Medicine

## 2021-12-28 ENCOUNTER — Other Ambulatory Visit: Payer: Self-pay | Admitting: Internal Medicine

## 2021-12-28 DIAGNOSIS — I5022 Chronic systolic (congestive) heart failure: Secondary | ICD-10-CM | POA: Diagnosis not present

## 2021-12-29 LAB — BASIC METABOLIC PANEL
BUN/Creatinine Ratio: 21 (ref 10–24)
BUN: 28 mg/dL — ABNORMAL HIGH (ref 8–27)
CO2: 28 mmol/L (ref 20–29)
Calcium: 10.1 mg/dL (ref 8.6–10.2)
Chloride: 97 mmol/L (ref 96–106)
Creatinine, Ser: 1.33 mg/dL — ABNORMAL HIGH (ref 0.76–1.27)
Glucose: 112 mg/dL — ABNORMAL HIGH (ref 70–99)
Potassium: 3.9 mmol/L (ref 3.5–5.2)
Sodium: 142 mmol/L (ref 134–144)
eGFR: 53 mL/min/{1.73_m2} — ABNORMAL LOW (ref >59)

## 2021-12-29 LAB — SPECIMEN STATUS REPORT

## 2021-12-29 LAB — BRAIN NATRIURETIC PEPTIDE: BNP: 505.6 pg/mL — ABNORMAL HIGH (ref 0.0–100.0)

## 2021-12-31 ENCOUNTER — Other Ambulatory Visit (HOSPITAL_COMMUNITY): Payer: Self-pay | Admitting: Internal Medicine

## 2022-01-05 ENCOUNTER — Telehealth (HOSPITAL_COMMUNITY): Payer: Self-pay | Admitting: *Deleted

## 2022-01-05 NOTE — Telephone Encounter (Signed)
Labs faxed.  "PCP request recent lab results Dr Drenda Freeze  office #[phone], [phone])"

## 2022-01-31 ENCOUNTER — Other Ambulatory Visit (HOSPITAL_COMMUNITY): Payer: Self-pay

## 2022-02-17 ENCOUNTER — Telehealth (HOSPITAL_COMMUNITY): Payer: Self-pay | Admitting: *Deleted

## 2022-02-17 NOTE — Telephone Encounter (Signed)
Last labs faxed to pcp as requested   Fax [phone]

## 2022-03-02 ENCOUNTER — Encounter: Payer: Self-pay | Admitting: Dermatology

## 2022-03-02 ENCOUNTER — Ambulatory Visit (INDEPENDENT_AMBULATORY_CARE_PROVIDER_SITE_OTHER): Payer: Medicare Other | Admitting: Dermatology

## 2022-03-02 DIAGNOSIS — D485 Neoplasm of uncertain behavior of skin: Secondary | ICD-10-CM

## 2022-03-02 DIAGNOSIS — Z86007 Personal history of in-situ neoplasm of skin: Secondary | ICD-10-CM

## 2022-03-02 DIAGNOSIS — L570 Actinic keratosis: Secondary | ICD-10-CM

## 2022-03-02 DIAGNOSIS — D0439 Carcinoma in situ of skin of other parts of face: Secondary | ICD-10-CM | POA: Diagnosis not present

## 2022-03-02 NOTE — Patient Instructions (Signed)

## 2022-03-08 DIAGNOSIS — I10 Essential (primary) hypertension: Secondary | ICD-10-CM | POA: Diagnosis not present

## 2022-03-08 DIAGNOSIS — E1142 Type 2 diabetes mellitus with diabetic polyneuropathy: Secondary | ICD-10-CM | POA: Diagnosis not present

## 2022-03-08 DIAGNOSIS — L989 Disorder of the skin and subcutaneous tissue, unspecified: Secondary | ICD-10-CM | POA: Diagnosis not present

## 2022-03-08 DIAGNOSIS — D692 Other nonthrombocytopenic purpura: Secondary | ICD-10-CM | POA: Diagnosis not present

## 2022-03-08 DIAGNOSIS — N1831 Chronic kidney disease, stage 3a: Secondary | ICD-10-CM | POA: Diagnosis not present

## 2022-03-08 DIAGNOSIS — I480 Paroxysmal atrial fibrillation: Secondary | ICD-10-CM | POA: Diagnosis not present

## 2022-03-08 DIAGNOSIS — E113399 Type 2 diabetes mellitus with moderate nonproliferative diabetic retinopathy without macular edema, unspecified eye: Secondary | ICD-10-CM | POA: Diagnosis not present

## 2022-03-08 DIAGNOSIS — E785 Hyperlipidemia, unspecified: Secondary | ICD-10-CM | POA: Diagnosis not present

## 2022-03-08 DIAGNOSIS — E1165 Type 2 diabetes mellitus with hyperglycemia: Secondary | ICD-10-CM | POA: Diagnosis not present

## 2022-03-08 DIAGNOSIS — I739 Peripheral vascular disease, unspecified: Secondary | ICD-10-CM | POA: Diagnosis not present

## 2022-03-24 ENCOUNTER — Encounter: Payer: Self-pay | Admitting: Dermatology

## 2022-03-24 NOTE — Progress Notes (Signed)
   Follow-Up Visit   Subjective  Ricardo [name] is a 86 y.o. male who presents for the following: Annual Exam (Left cheek burns when he shaves over it and it grows back. Personal history of bcc and scc).  Sensitive new crust on left cheek plus new crusts on arms Location:  Duration:  Quality:  Associated Signs/Symptoms: Modifying Factors:  Severity:  Timing: Context:   Objective  Well appearing patient in no apparent distress; mood and affect are within normal limits. Left Forearm - Anterior, Right Forearm - Anterior Hornlike 3 mm pink crusts  Left Malar Cheek Scar clear today.   Left Cheek 6 mm waxy crust, probable superficial carcinoma         A focused examination was performed including head, neck, arms. Relevant physical exam findings are noted in the Assessment and Plan.   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  Check as needed change  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 Left Cheek  After shave biopsy the base of the lesion was treated with curettage plus cautery  Skin / nail biopsy - Left Cheek Type of biopsy: tangential   Informed consent: discussed and consent obtained   Timeout: patient name, date of birth, surgical site, and procedure verified   Anesthesia: the lesion was anesthetized in a standard fashion   Anesthetic:  1% lidocaine w/  epinephrine 1-100,000 local infiltration Instrument used: flexible razor blade   Hemostasis achieved with: aluminum chloride and electrodesiccation   Outcome: patient tolerated procedure well   Post-procedure details: wound care instructions given    Destruction of lesion - Left Cheek Complexity: simple   Destruction method: electrodesiccation and curettage   Informed consent: discussed and consent obtained   Timeout:  patient name, date of birth, surgical site, and procedure verified Anesthesia: the lesion was anesthetized in a standard fashion   Anesthetic:  1% lidocaine w/ epinephrine 1-100,000 local infiltration Curettage performed in three different directions: Yes   Electrodesiccation performed over the curetted area: Yes   Curettage cycles:  3 Lesion length (cm):  1 Lesion width (cm):  1 Margin per side (cm):  0 Final wound size (cm):  1 Hemostasis achieved with:  ferric subsulfate and electrodesiccation Outcome: patient tolerated procedure well with no complications   Post-procedure details: sterile dressing applied and wound care instructions given   Dressing type: bandage and petrolatum    Specimen 1 - Surgical pathology Differential Diagnosis: R/O BCC vs SCC - treated after biopsy  Check Margins: No      I, Lavonna Monarch, MD, have reviewed all documentation for this visit.  The documentation on 03/24/22 for the exam, diagnosis, procedures, and orders are all accurate and complete.

## 2022-04-21 DIAGNOSIS — H353132 Nonexudative age-related macular degeneration, bilateral, intermediate dry stage: Secondary | ICD-10-CM | POA: Diagnosis not present

## 2022-04-21 DIAGNOSIS — E103393 Type 1 diabetes mellitus with moderate nonproliferative diabetic retinopathy without macular edema, bilateral: Secondary | ICD-10-CM | POA: Diagnosis not present

## 2022-05-19 DIAGNOSIS — Z23 Encounter for immunization: Secondary | ICD-10-CM | POA: Diagnosis not present

## 2022-05-31 DIAGNOSIS — E1142 Type 2 diabetes mellitus with diabetic polyneuropathy: Secondary | ICD-10-CM | POA: Diagnosis not present

## 2022-05-31 DIAGNOSIS — Z125 Encounter for screening for malignant neoplasm of prostate: Secondary | ICD-10-CM | POA: Diagnosis not present

## 2022-05-31 DIAGNOSIS — E1165 Type 2 diabetes mellitus with hyperglycemia: Secondary | ICD-10-CM | POA: Diagnosis not present

## 2022-05-31 DIAGNOSIS — Z1211 Encounter for screening for malignant neoplasm of colon: Secondary | ICD-10-CM | POA: Diagnosis not present

## 2022-05-31 DIAGNOSIS — R413 Other amnesia: Secondary | ICD-10-CM | POA: Diagnosis not present

## 2022-05-31 DIAGNOSIS — I739 Peripheral vascular disease, unspecified: Secondary | ICD-10-CM | POA: Diagnosis not present

## 2022-05-31 DIAGNOSIS — Z0001 Encounter for general adult medical examination with abnormal findings: Secondary | ICD-10-CM | POA: Diagnosis not present

## 2022-05-31 DIAGNOSIS — D692 Other nonthrombocytopenic purpura: Secondary | ICD-10-CM | POA: Diagnosis not present

## 2022-05-31 DIAGNOSIS — E785 Hyperlipidemia, unspecified: Secondary | ICD-10-CM | POA: Diagnosis not present

## 2022-05-31 DIAGNOSIS — E782 Mixed hyperlipidemia: Secondary | ICD-10-CM | POA: Diagnosis not present

## 2022-05-31 DIAGNOSIS — M7918 Myalgia, other site: Secondary | ICD-10-CM | POA: Diagnosis not present

## 2022-05-31 DIAGNOSIS — Z23 Encounter for immunization: Secondary | ICD-10-CM | POA: Diagnosis not present

## 2022-08-09 ENCOUNTER — Encounter (HOSPITAL_COMMUNITY): Payer: Medicare Other | Admitting: Internal Medicine

## 2022-08-22 ENCOUNTER — Other Ambulatory Visit: Payer: Self-pay | Admitting: Internal Medicine

## 2022-08-26 DIAGNOSIS — R413 Other amnesia: Secondary | ICD-10-CM | POA: Diagnosis not present

## 2022-08-26 DIAGNOSIS — Z0001 Encounter for general adult medical examination with abnormal findings: Secondary | ICD-10-CM | POA: Diagnosis not present

## 2022-08-26 DIAGNOSIS — E785 Hyperlipidemia, unspecified: Secondary | ICD-10-CM | POA: Diagnosis not present

## 2022-08-26 DIAGNOSIS — E790 Hyperuricemia without signs of inflammatory arthritis and tophaceous disease: Secondary | ICD-10-CM | POA: Diagnosis not present

## 2022-08-26 DIAGNOSIS — E1165 Type 2 diabetes mellitus with hyperglycemia: Secondary | ICD-10-CM | POA: Diagnosis not present

## 2022-08-26 DIAGNOSIS — E559 Vitamin D deficiency, unspecified: Secondary | ICD-10-CM | POA: Diagnosis not present

## 2022-08-31 DIAGNOSIS — I482 Chronic atrial fibrillation, unspecified: Secondary | ICD-10-CM | POA: Diagnosis not present

## 2022-08-31 DIAGNOSIS — I739 Peripheral vascular disease, unspecified: Secondary | ICD-10-CM | POA: Diagnosis not present

## 2022-08-31 DIAGNOSIS — I5022 Chronic systolic (congestive) heart failure: Secondary | ICD-10-CM | POA: Diagnosis not present

## 2022-08-31 DIAGNOSIS — N1831 Chronic kidney disease, stage 3a: Secondary | ICD-10-CM | POA: Diagnosis not present

## 2022-08-31 DIAGNOSIS — E1165 Type 2 diabetes mellitus with hyperglycemia: Secondary | ICD-10-CM | POA: Diagnosis not present

## 2022-08-31 DIAGNOSIS — E782 Mixed hyperlipidemia: Secondary | ICD-10-CM | POA: Diagnosis not present

## 2022-08-31 DIAGNOSIS — H8110 Benign paroxysmal vertigo, unspecified ear: Secondary | ICD-10-CM | POA: Diagnosis not present

## 2022-08-31 DIAGNOSIS — E113399 Type 2 diabetes mellitus with moderate nonproliferative diabetic retinopathy without macular edema, unspecified eye: Secondary | ICD-10-CM | POA: Diagnosis not present

## 2022-08-31 DIAGNOSIS — I10 Essential (primary) hypertension: Secondary | ICD-10-CM | POA: Diagnosis not present

## 2022-08-31 DIAGNOSIS — D692 Other nonthrombocytopenic purpura: Secondary | ICD-10-CM | POA: Diagnosis not present

## 2022-08-31 DIAGNOSIS — E790 Hyperuricemia without signs of inflammatory arthritis and tophaceous disease: Secondary | ICD-10-CM | POA: Diagnosis not present

## 2022-08-31 DIAGNOSIS — L989 Disorder of the skin and subcutaneous tissue, unspecified: Secondary | ICD-10-CM | POA: Diagnosis not present

## 2022-09-28 ENCOUNTER — Encounter (HOSPITAL_COMMUNITY): Payer: Medicare Other | Admitting: Internal Medicine

## 2022-10-13 DIAGNOSIS — N2581 Secondary hyperparathyroidism of renal origin: Secondary | ICD-10-CM | POA: Diagnosis not present

## 2022-10-13 DIAGNOSIS — N1831 Chronic kidney disease, stage 3a: Secondary | ICD-10-CM | POA: Diagnosis not present

## 2022-10-13 DIAGNOSIS — I129 Hypertensive chronic kidney disease with stage 1 through stage 4 chronic kidney disease, or unspecified chronic kidney disease: Secondary | ICD-10-CM | POA: Diagnosis not present

## 2022-10-13 DIAGNOSIS — D631 Anemia in chronic kidney disease: Secondary | ICD-10-CM | POA: Diagnosis not present

## 2022-10-18 ENCOUNTER — Other Ambulatory Visit (HOSPITAL_COMMUNITY): Payer: Self-pay | Admitting: Internal Medicine

## 2022-10-19 ENCOUNTER — Other Ambulatory Visit (HOSPITAL_COMMUNITY): Payer: Self-pay | Admitting: Internal Medicine

## 2022-11-01 DIAGNOSIS — D485 Neoplasm of uncertain behavior of skin: Secondary | ICD-10-CM | POA: Diagnosis not present

## 2022-11-01 DIAGNOSIS — C44319 Basal cell carcinoma of skin of other parts of face: Secondary | ICD-10-CM | POA: Diagnosis not present

## 2022-11-01 DIAGNOSIS — L570 Actinic keratosis: Secondary | ICD-10-CM | POA: Diagnosis not present

## 2022-11-09 DIAGNOSIS — C44319 Basal cell carcinoma of skin of other parts of face: Secondary | ICD-10-CM | POA: Diagnosis not present

## 2022-11-17 ENCOUNTER — Other Ambulatory Visit: Payer: Self-pay | Admitting: *Deleted

## 2022-11-17 MED ORDER — METOPROLOL SUCCINATE ER 50 MG PO TB24: ORAL_TABLET | ORAL | 3 refills | Status: DC

## 2022-12-19 ENCOUNTER — Other Ambulatory Visit (HOSPITAL_COMMUNITY): Payer: Self-pay | Admitting: Internal Medicine

## 2022-12-26 NOTE — Progress Notes (Signed)
[facility] Note  Date:  12/27/2022   ID:  Ricardo [name], DOB Feb 13, 1936, MRN [id number]  Location: Home  Provider location: [facility] Type of Visit: Established patient  PCP:  Ricardo [name]  Cardiologist:  None Primary HF: Ricardo [name]  Chief Complaint: Heart Failure follow-up   History of Present Illness:  Ricardo [name] is a 86y.o. male  with h/o HTN, diabetes, CAD and chronic atrial fibrillation.   Previously followed by Ricardo [name] at [facility]. He is s/p previous AFL ablation. Echo 12/13 EF 40-45%. Had sleep study in past and placed on CPAP. He was subsequently told OSA was mild and CPAP stopped.    In January 2018 began to develop HF symptoms. AF rate elevated. Echo 40-45% with moderate RV dysfunction. Mild AS. Diuresed 9 pounds and placed on metoprolol and digoxin for HR control. Weight on d/c 145.   Cath 4/18 Very heavily calcified coronary arteries with high grade lesion in moderate-sized OM1 and tandem 70% and 80% lesions in mid LAD. Distal PDA 90%. Treated medically.   Echo 11/17/21 EF 45% Moderate RV dysfunction Mild AS severe  biatrial enlargement   Here is here for f/u. At last visit digoxin stopped and eplerenone started.   Doing well. Still working full-time. Riding the stationary bike 3 miles several times per week. [name] CP or SOB. [name] edema, orthopnea or PND.   cMRI 1/19   1) Mild LVE with moderate LVH Diffuse hypokinesis worse in the inferolateral wall. Quantitative EF 43% 2) SEMI seen in basal anterolateral and inferolateral walls as well as mid inferolateral wall 3) Severe LAE Moderate RAE 4) PFO present 5) Moderate appearing MR 6) Tri- leaflet AV with some thickening and restricted motion suggest echo correlation   Echo 11/06/16 LVEF 40%, Mild/Mod MR, Severe LAE, Severe RAE, + Patent foramen ovale   Cath 11/15/16 Very heavily calcified coronary arteries with high grade lesion in moderate-sized OM1 and tandem 70%  and 80% lesions in mid LAD. Distal PDA 90%. Treated medically.    RA =  6 RV = 48/6 PA = 48/19 (29) PCW = 17 Ao = 135/89 (108) LV = 147/14 Fick cardiac output/index = 3.8/2.2 PVR = 3.2 SVR = 2131 FA sat = 98% PA sat = 67%. 69%   Past Medical History:  Diagnosis Date   Atrial fibrillation (HCC)    on coumadin.  cardiologist is in [facility] dr Ricardo [name].  S/P multiple DCCVs and prior ablation.    Basal cell carcinoma 12/23/2014   nod-left forearm (CX35FU)   BPH (benign prostatic hyperplasia)    by ST in 2000.    CHF (congestive heart failure) (HCC)    Complication of anesthesia    " sometimes slow to wake up"   Diabetes mellitus without complication (HCC)    Diverticulitis    question if he ever had imaging confirmed diverticulitis   GERD (gastroesophageal reflux disease)    GI bleed    Gout    Hypertension    Kidney stones    treated with lithotripsy    Pneumonia    PONV (postoperative nausea and vomiting)    Rupture of biceps tendon 2006   left distal bicep tendon   SCCA (squamous cell carcinoma) of skin 01/29/2020   Left Malar Cheek (in situ)   Squamous cell carcinoma of skin 04/08/2019   in situ-left cheek-txpbx   UTI (urinary tract infection)    Past Surgical History:  Procedure Laterality Date  CARDIOVERSION     CATARACT EXTRACTION, BILATERAL     CHOLECYSTECTOMY     COLONOSCOPY  04/18/2012   Procedure: COLONOSCOPY;  Surgeon: Ricardo [name];  Location: [facility] ENDOSCOPY;  Service: Endoscopy;  Laterality: N/A;   DISTAL BICEPS TENDON REPAIR  2006   dr Ricardo [name]   HEMORRHOID SURGERY     twice   HERNIA REPAIR     LITHOTRIPSY     of kidney stones.    PALATE / UVULA BIOPSY / EXCISION     uvula excision   RIGHT/LEFT HEART CATH AND CORONARY ANGIOGRAPHY N/A 11/15/2016   Procedure: Right/Left Heart Cath and Coronary Angiography;  Surgeon: Ricardo [name];  Location: [facility] INVASIVE CV LAB;  Service: Cardiovascular;  Laterality: N/A;   thyroid nodule  excision       Current Outpatient Medications  Medication Sig Dispense Refill   b complex vitamins tablet Take 1 tablet by mouth daily.     Cinnamon 500 MG capsule Take 1,000 mg by mouth daily.     Coenzyme Q10 (CO Q10) 200 MG CAPS Take 200 mcg by mouth daily.     cyanocobalamin 100 MCG tablet Take 1,000 mcg by mouth daily.     ELIQUIS 2.5 MG TABS tablet TAKE 1 TABLET(2.5 MG) BY MOUTH TWICE DAILY 60 tablet 11   empagliflozin (JARDIANCE) 10 MG TABS tablet Take 10 mg by mouth daily.     ENTRESTO 24-26 MG TAKE 1 TABLET BY MOUTH TWICE DAILY 60 tablet 11   eplerenone (INSPRA) 25 MG tablet Take 0.5 tablets (12.5 mg total) by mouth daily. 30 tablet 6   fluticasone (FLONASE) 50 MCG/ACT nasal spray Place 1 spray into both nostrils at bedtime.     ipratropium (ATROVENT) 0.03 % nasal spray ipratropium bromide 21 mcg (0.03 %) nasal spray     meclizine (ANTIVERT) 12.5 MG tablet Take 12.5 mg by mouth 3 (three) times daily as needed for dizziness.     metoprolol succinate (TOPROL-XL) 50 MG 24 hr tablet TAKE 1 TABLET(50 MG) BY MOUTH DAILY WITH OR IMMEDIATELY FOLLOWING A MEAL 90 tablet 3   potassium chloride SA (KLOR-CON M) 20 MEQ tablet Take 2 tablets (40 mEq total) by mouth daily. 180 tablet 3   rosuvastatin (CRESTOR) 5 MG tablet Take 1 tablet (5 mg total) by mouth daily. 90 tablet 3   torsemide (DEMADEX) 20 MG tablet Take 10 mg by mouth daily.     vitamin C (ASCORBIC ACID) 250 MG tablet Take 250 mg by mouth daily.     [name] current facility-administered medications for this encounter.    Allergies:   Codeine and Tape   Social History:  The patient  reports that he has never smoked. He has never used smokeless tobacco. He reports current alcohol use. He reports that he does not use drugs.   Family History:  The patient's family history includes Congestive Heart Failure in his father; Stroke in his father; Tuberculosis in his mother.   ROS:  Please see the history of present illness.   All other systems  are personally reviewed and negative.   Vitals:   12/27/22 1525  BP: (!) 140/80  Pulse: (!) 41  SpO2: 97%  Weight: 66.2 kg (146 lb)     Exam:  General:  Elderly. [name] resp difficulty HEENT: normal Neck: supple. [name] JVD. Carotids 2+ bilat; + bruits. [name] lymphadenopathy or thryomegaly appreciated. Cor: PMI nondisplaced. Regular rate & rhythm. 2/6 AS Lungs: clear Abdomen: soft, nontender, nondistended. [name] hepatosplenomegaly. [name]  bruits or masses. Good bowel sounds. Extremities: [name] cyanosis, clubbing, rash, edema Neuro: alert & orientedx3, cranial nerves grossly intact. moves all 4 extremities w/o difficulty. Affect pleasant  ECG: AF 53 RBBB +PVCs Personally reviewed   Recent Labs: 12/28/2021: BNP 505.6; BUN 28; Creatinine, Ser 1.33; Potassium 3.9; Sodium 142  Personally reviewed   Wt Readings from Last 3 Encounters:  12/27/22 66.2 kg (146 lb)  11/17/21 63.3 kg (139 lb 9.6 oz)  02/22/21 64.8 kg (142 lb 12.8 oz)      ASSESSMENT AND PLAN:  1. Chronic systolic HF - Echo 11/06/16 EF 16-10% with moderate RV dysfunction. Seems out of proportion to CAD. ? PVC related with 10% PVCs on Holter 9/18. - SPEP negative. CMRI 2019 [name] infiltrative process EF 43% - Echo 9/21 EF 40-45% RV mildly down  AoV sclerosis without stenosis.  - Echo  11/17/21 EF 45% Moderate RV dysfunction Mild AS severe  biatrial enlargement - Continues to do very well.  NYHA I-II - Volume status looks good on torsemide 10 mg daily - Continue Toprol 50mg  daily. HR too low to titrate - Continue entresto to 49-51 mg twice a day. Have not titrated due to intermittent lightheadedness.  - On eplerenone 12.5 daily. Unable to tolerate spiro due to severe gynecomastia.  2. Chronic AF with slow VR - Previously with RVR. Now with relatively slow VR - HR typically in 50- 60s.  - CHADSVASC = 5 (age, DM, HTN, HF) - Continue Eliquis 2.5 bid (age > 57, last creatinine 1.45, weight 63kg).   3. Mild aortic stenosis - Mild on echo  -  Repeat echo in 6 months  4. HTN  - Blood pressure well controlled. Continue current regimen.   5. DM2 - Per PCP. Now on Jardiance   6. Suspected sleep apnea.  - Sleep study was negative.     7. CAD - He has highly calcified coronary arteries with borderline lesion in mLAD.  - [name] s/s angina. Continue medical therapy with statin. Off ASA with Eliquis  8. PVCs.  - 10% PVCs on Holter 9/18 - EF has been stable despite PVCs. IF ef dropping will need to repeat monitor to reassess PVC burden  Arvilla Meres, [name]  5:43 PM    Signed, Arvilla Meres, [name]  12/27/2022 5:40 PM  [facility] [address] [facility] [place] [address] [phone] (office) [phone] (fax)

## 2022-12-27 ENCOUNTER — Encounter (HOSPITAL_COMMUNITY): Payer: Self-pay | Admitting: Internal Medicine

## 2022-12-27 ENCOUNTER — Ambulatory Visit (HOSPITAL_COMMUNITY)
Admission: RE | Admit: 2022-12-27 | Discharge: 2022-12-27 | Disposition: A | Discharge status: Home / Self Care | Payer: Medicare Other | Source: Ambulatory Visit | Attending: Internal Medicine | Admitting: Internal Medicine

## 2022-12-27 VITALS — BP 140/80 | HR 41 | Wt 146.0 lb

## 2022-12-27 DIAGNOSIS — I052 Rheumatic mitral stenosis with insufficiency: Secondary | ICD-10-CM | POA: Insufficient documentation

## 2022-12-27 DIAGNOSIS — Z7901 Long term (current) use of anticoagulants: Secondary | ICD-10-CM | POA: Diagnosis not present

## 2022-12-27 DIAGNOSIS — G4733 Obstructive sleep apnea (adult) (pediatric): Secondary | ICD-10-CM | POA: Insufficient documentation

## 2022-12-27 DIAGNOSIS — I2510 Atherosclerotic heart disease of native coronary artery without angina pectoris: Secondary | ICD-10-CM | POA: Insufficient documentation

## 2022-12-27 DIAGNOSIS — E119 Type 2 diabetes mellitus without complications: Secondary | ICD-10-CM | POA: Insufficient documentation

## 2022-12-27 DIAGNOSIS — I493 Ventricular premature depolarization: Secondary | ICD-10-CM | POA: Insufficient documentation

## 2022-12-27 DIAGNOSIS — Q2112 Patent foramen ovale: Secondary | ICD-10-CM | POA: Insufficient documentation

## 2022-12-27 DIAGNOSIS — Z823 Family history of stroke: Secondary | ICD-10-CM | POA: Diagnosis not present

## 2022-12-27 DIAGNOSIS — I110 Hypertensive heart disease with heart failure: Secondary | ICD-10-CM | POA: Insufficient documentation

## 2022-12-27 DIAGNOSIS — Z8249 Family history of ischemic heart disease and other diseases of the circulatory system: Secondary | ICD-10-CM | POA: Insufficient documentation

## 2022-12-27 DIAGNOSIS — I482 Chronic atrial fibrillation, unspecified: Secondary | ICD-10-CM | POA: Diagnosis not present

## 2022-12-27 DIAGNOSIS — I350 Nonrheumatic aortic (valve) stenosis: Secondary | ICD-10-CM | POA: Diagnosis not present

## 2022-12-27 DIAGNOSIS — Z79899 Other long term (current) drug therapy: Secondary | ICD-10-CM | POA: Diagnosis not present

## 2022-12-27 DIAGNOSIS — I5022 Chronic systolic (congestive) heart failure: Secondary | ICD-10-CM | POA: Insufficient documentation

## 2022-12-27 DIAGNOSIS — N62 Hypertrophy of breast: Secondary | ICD-10-CM | POA: Insufficient documentation

## 2022-12-27 NOTE — Patient Instructions (Signed)
There has been no changes to your medications.  The office fax number is [phone].  Your physician has requested that you have an echocardiogram. Echocardiography is a painless test that uses sound waves to create images of your heart. It provides your doctor with information about the size and shape of your heart and how well your heart's chambers and valves are working. This procedure takes approximately one hour. There are no restrictions for this procedure. Please do NOT wear cologne, perfume, aftershave, or lotions (deodorant is allowed). Please arrive 15 minutes prior to your appointment time.  Your physician recommends that you schedule a follow-up appointment in: 6 months with an echocardiogram ( November) ** please call the office in August to arrange your follow up appointment. **  If you have any questions or concerns before your next appointment please send [place] a message through [place] or call our office at [phone].    TO LEAVE A MESSAGE FOR THE NURSE SELECT OPTION 2, PLEASE LEAVE A MESSAGE INCLUDING: YOUR NAME DATE OF BIRTH CALL BACK NUMBER REASON FOR CALL**this is important as we prioritize the call backs  YOU WILL RECEIVE A CALL BACK THE SAME DAY AS LONG AS YOU CALL BEFORE 4:00 PM  At the [facility], you and your health needs are our priority. As part of our continuing mission to provide you with exceptional heart care, we have created designated Provider Care Teams. These Care Teams include your primary Cardiologist (physician) and Advanced Practice Providers (APPs- Physician Assistants and Nurse Practitioners) who all work together to provide you with the care you need, when you need it.   You may see any of the following providers on your designated Care Team at your next follow up: Dr Arvilla Meres Dr Marca Ancona Dr. Marcos Eke, NP Robbie Lis, [place] [facility] [place], [place] Brynda Peon, NP Karle Plumber,  PharmD   Please be sure to bring in all your medications bottles to every appointment.    Thank you for choosing [facility]

## 2023-01-10 DIAGNOSIS — E103393 Type 1 diabetes mellitus with moderate nonproliferative diabetic retinopathy without macular edema, bilateral: Secondary | ICD-10-CM | POA: Diagnosis not present

## 2023-01-10 DIAGNOSIS — H353132 Nonexudative age-related macular degeneration, bilateral, intermediate dry stage: Secondary | ICD-10-CM | POA: Diagnosis not present

## 2023-01-30 DIAGNOSIS — I739 Peripheral vascular disease, unspecified: Secondary | ICD-10-CM | POA: Diagnosis not present

## 2023-01-30 DIAGNOSIS — E113399 Type 2 diabetes mellitus with moderate nonproliferative diabetic retinopathy without macular edema, unspecified eye: Secondary | ICD-10-CM | POA: Diagnosis not present

## 2023-01-30 DIAGNOSIS — E790 Hyperuricemia without signs of inflammatory arthritis and tophaceous disease: Secondary | ICD-10-CM | POA: Diagnosis not present

## 2023-01-30 DIAGNOSIS — R634 Abnormal weight loss: Secondary | ICD-10-CM | POA: Diagnosis not present

## 2023-01-30 DIAGNOSIS — E782 Mixed hyperlipidemia: Secondary | ICD-10-CM | POA: Diagnosis not present

## 2023-01-30 DIAGNOSIS — M791 Myalgia, unspecified site: Secondary | ICD-10-CM | POA: Diagnosis not present

## 2023-01-30 DIAGNOSIS — H8110 Benign paroxysmal vertigo, unspecified ear: Secondary | ICD-10-CM | POA: Diagnosis not present

## 2023-01-30 DIAGNOSIS — I5022 Chronic systolic (congestive) heart failure: Secondary | ICD-10-CM | POA: Diagnosis not present

## 2023-01-30 DIAGNOSIS — E1165 Type 2 diabetes mellitus with hyperglycemia: Secondary | ICD-10-CM | POA: Diagnosis not present

## 2023-01-30 DIAGNOSIS — I10 Essential (primary) hypertension: Secondary | ICD-10-CM | POA: Diagnosis not present

## 2023-01-30 DIAGNOSIS — I482 Chronic atrial fibrillation, unspecified: Secondary | ICD-10-CM | POA: Diagnosis not present

## 2023-01-30 DIAGNOSIS — N1831 Chronic kidney disease, stage 3a: Secondary | ICD-10-CM | POA: Diagnosis not present

## 2023-05-16 DIAGNOSIS — D485 Neoplasm of uncertain behavior of skin: Secondary | ICD-10-CM | POA: Diagnosis not present

## 2023-05-26 DIAGNOSIS — C44612 Basal cell carcinoma of skin of right upper limb, including shoulder: Secondary | ICD-10-CM | POA: Diagnosis not present

## 2023-06-22 DIAGNOSIS — N1831 Chronic kidney disease, stage 3a: Secondary | ICD-10-CM | POA: Diagnosis not present

## 2023-06-22 DIAGNOSIS — E782 Mixed hyperlipidemia: Secondary | ICD-10-CM | POA: Diagnosis not present

## 2023-06-22 DIAGNOSIS — E790 Hyperuricemia without signs of inflammatory arthritis and tophaceous disease: Secondary | ICD-10-CM | POA: Diagnosis not present

## 2023-06-22 DIAGNOSIS — E559 Vitamin D deficiency, unspecified: Secondary | ICD-10-CM | POA: Diagnosis not present

## 2023-06-22 DIAGNOSIS — I10 Essential (primary) hypertension: Secondary | ICD-10-CM | POA: Diagnosis not present

## 2023-06-22 DIAGNOSIS — E1165 Type 2 diabetes mellitus with hyperglycemia: Secondary | ICD-10-CM | POA: Diagnosis not present

## 2023-06-27 DIAGNOSIS — C44619 Basal cell carcinoma of skin of left upper limb, including shoulder: Secondary | ICD-10-CM | POA: Diagnosis not present

## 2023-07-04 DIAGNOSIS — Z0001 Encounter for general adult medical examination with abnormal findings: Secondary | ICD-10-CM | POA: Diagnosis not present

## 2023-07-04 DIAGNOSIS — I739 Peripheral vascular disease, unspecified: Secondary | ICD-10-CM | POA: Diagnosis not present

## 2023-07-04 DIAGNOSIS — Z1211 Encounter for screening for malignant neoplasm of colon: Secondary | ICD-10-CM | POA: Diagnosis not present

## 2023-07-04 DIAGNOSIS — I10 Essential (primary) hypertension: Secondary | ICD-10-CM | POA: Diagnosis not present

## 2023-07-04 DIAGNOSIS — M7918 Myalgia, other site: Secondary | ICD-10-CM | POA: Diagnosis not present

## 2023-07-04 DIAGNOSIS — E113399 Type 2 diabetes mellitus with moderate nonproliferative diabetic retinopathy without macular edema, unspecified eye: Secondary | ICD-10-CM | POA: Diagnosis not present

## 2023-07-04 DIAGNOSIS — Z23 Encounter for immunization: Secondary | ICD-10-CM | POA: Diagnosis not present

## 2023-07-04 DIAGNOSIS — N1831 Chronic kidney disease, stage 3a: Secondary | ICD-10-CM | POA: Diagnosis not present

## 2023-07-04 DIAGNOSIS — W19XXXA Unspecified fall, initial encounter: Secondary | ICD-10-CM | POA: Diagnosis not present

## 2023-07-04 DIAGNOSIS — I5022 Chronic systolic (congestive) heart failure: Secondary | ICD-10-CM | POA: Diagnosis not present

## 2023-07-04 DIAGNOSIS — I482 Chronic atrial fibrillation, unspecified: Secondary | ICD-10-CM | POA: Diagnosis not present

## 2023-07-04 DIAGNOSIS — Z125 Encounter for screening for malignant neoplasm of prostate: Secondary | ICD-10-CM | POA: Diagnosis not present

## 2023-07-12 ENCOUNTER — Telehealth (HOSPITAL_COMMUNITY): Payer: Self-pay

## 2023-07-12 DIAGNOSIS — I5022 Chronic systolic (congestive) heart failure: Secondary | ICD-10-CM

## 2023-07-12 NOTE — Telephone Encounter (Addendum)
Received patients lab results from 06/22/23   K: 3.4 Cr:1.40  Per Brynda Peon, NP, K is a little low have patient take an extra of K. Have patient repeat bmet in 7-10 days Patient advised and verbalized understanding, will go to pcp for blood work   Orders Placed This Encounter  Procedures   Basic metabolic panel    Standing Status:   Future    Standing Expiration Date:   07/11/2024    Order Specific Question:   Release to patient    Answer:   Immediate    Order Specific Question:   Release to patient    Answer:   Immediate [1]

## 2023-07-19 ENCOUNTER — Other Ambulatory Visit (HOSPITAL_COMMUNITY): Payer: Self-pay | Admitting: Internal Medicine

## 2023-07-27 DIAGNOSIS — R413 Other amnesia: Secondary | ICD-10-CM | POA: Diagnosis not present

## 2023-07-27 DIAGNOSIS — I5022 Chronic systolic (congestive) heart failure: Secondary | ICD-10-CM | POA: Diagnosis not present

## 2023-07-27 DIAGNOSIS — H8110 Benign paroxysmal vertigo, unspecified ear: Secondary | ICD-10-CM | POA: Diagnosis not present

## 2023-08-11 ENCOUNTER — Other Ambulatory Visit: Payer: Self-pay

## 2023-08-11 ENCOUNTER — Emergency Department (HOSPITAL_COMMUNITY)
Admission: EM | Admit: 2023-08-11 | Discharge: 2023-08-11 | Disposition: A | Discharge status: Home / Self Care | Payer: Medicare Other

## 2023-08-11 ENCOUNTER — Emergency Department (HOSPITAL_COMMUNITY): Payer: Medicare Other

## 2023-08-11 ENCOUNTER — Encounter (HOSPITAL_COMMUNITY): Payer: Self-pay

## 2023-08-11 DIAGNOSIS — Z7901 Long term (current) use of anticoagulants: Secondary | ICD-10-CM | POA: Insufficient documentation

## 2023-08-11 DIAGNOSIS — I719 Aortic aneurysm of unspecified site, without rupture: Secondary | ICD-10-CM | POA: Diagnosis not present

## 2023-08-11 DIAGNOSIS — K5730 Diverticulosis of large intestine without perforation or abscess without bleeding: Secondary | ICD-10-CM | POA: Diagnosis not present

## 2023-08-11 DIAGNOSIS — R319 Hematuria, unspecified: Secondary | ICD-10-CM | POA: Insufficient documentation

## 2023-08-11 DIAGNOSIS — R109 Unspecified abdominal pain: Secondary | ICD-10-CM | POA: Diagnosis not present

## 2023-08-11 DIAGNOSIS — I714 Abdominal aortic aneurysm, without rupture, unspecified: Secondary | ICD-10-CM | POA: Diagnosis not present

## 2023-08-11 DIAGNOSIS — N200 Calculus of kidney: Secondary | ICD-10-CM | POA: Diagnosis not present

## 2023-08-11 LAB — CBC
HCT: 42.2 % (ref 39.0–52.0)
Hemoglobin: 13.9 g/dL (ref 13.0–17.0)
MCH: 30.8 pg (ref 26.0–34.0)
MCHC: 32.9 g/dL (ref 30.0–36.0)
MCV: 93.4 fL (ref 80.0–100.0)
Platelets: 178 10*3/uL (ref 150–400)
RBC: 4.52 MIL/uL (ref 4.22–5.81)
RDW: 15 % (ref 11.5–15.5)
WBC: 8.6 10*3/uL (ref 4.0–10.5)
nRBC: 0 % (ref 0.0–0.2)

## 2023-08-11 LAB — URINALYSIS, ROUTINE W REFLEX MICROSCOPIC
Bacteria, UA: NONE SEEN
Bilirubin Urine: NEGATIVE
Glucose, UA: >=500 mg/dL — AB
Ketones, ur: NEGATIVE mg/dL
Leukocytes,Ua: NEGATIVE
Nitrite: NEGATIVE
Protein, ur: NEGATIVE mg/dL
Specific Gravity, Urine: 1.006 (ref 1.005–1.030)
pH: 6 (ref 5.0–8.0)

## 2023-08-11 LAB — BASIC METABOLIC PANEL
Anion gap: 9 (ref 5–15)
BUN: 27 mg/dL — ABNORMAL HIGH (ref 8–23)
CO2: 30 mmol/L (ref 22–32)
Calcium: 9.3 mg/dL (ref 8.9–10.3)
Chloride: 98 mmol/L (ref 98–111)
Creatinine, Ser: 1.5 mg/dL — ABNORMAL HIGH (ref 0.61–1.24)
GFR, Estimated: 45 mL/min — ABNORMAL LOW (ref >60)
Glucose, Bld: 165 mg/dL — ABNORMAL HIGH (ref 70–99)
Potassium: 3.3 mmol/L — ABNORMAL LOW (ref 3.5–5.1)
Sodium: 137 mmol/L (ref 135–145)

## 2023-08-11 NOTE — ED Notes (Signed)
Provided patient with urinal. Wife at bedside

## 2023-08-11 NOTE — ED Provider Notes (Signed)
[place] EMERGENCY DEPARTMENT AT [facility] Provider Note   CSN: [id number] Arrival date & time: 08/11/23  2000     History  Chief Complaint  Patient presents with   Hematuria    General Filosa is a 87 y.o. male.  87 year old male with past medical history of atrial fibrillation on Eliquis presenting to the emergency department today with hematuria.  The patient states that he noticed some blood in his urine today.  This was the first time.  He states that he noted some blood at the urethral meatus after urinating.  He denies any difficulty urinating.  He reports that he is able to empty his bladder fully.  He denies any significant pain with this.  Occasionally came to the ER today for further evaluation regarding this.  Denies any blood in his stool or dark stools currently.   Hematuria       Home Medications Prior to Admission medications   Medication Sig Start Date End Date Taking? Authorizing Provider  b complex vitamins tablet Take 1 tablet by mouth daily.    [provider]  Cinnamon 500 MG capsule Take 1,000 mg by mouth daily.    [provider]  Coenzyme Q10 (CO Q10) 200 MG CAPS Take 200 mcg by mouth daily.    [provider]  cyanocobalamin 100 MCG tablet Take 1,000 mcg by mouth daily.    [provider]  ELIQUIS 2.5 MG TABS tablet TAKE 1 TABLET(2.5 MG) BY MOUTH TWICE DAILY 08/23/22   Bensimhon, Bevelyn Buckles, MD  empagliflozin (JARDIANCE) 10 MG TABS tablet Take 10 mg by mouth daily.    [provider]  ENTRESTO 24-26 MG TAKE 1 TABLET BY MOUTH TWICE DAILY 12/19/22   Bensimhon, Bevelyn Buckles, MD  eplerenone (INSPRA) 25 MG tablet Take 0.5 tablets (12.5 mg total) by mouth daily. 11/17/21   Bensimhon, Bevelyn Buckles, MD  fluticasone (FLONASE) 50 MCG/ACT nasal spray Place 1 spray into both nostrils at bedtime. 02/07/20   [provider]  ipratropium (ATROVENT) 0.03 % nasal spray ipratropium bromide 21 mcg (0.03 %) nasal spray     [provider]  meclizine (ANTIVERT) 12.5 MG tablet Take 12.5 mg by mouth 3 (three) times daily as needed for dizziness.    [provider]  metoprolol succinate (TOPROL-XL) 50 MG 24 hr tablet TAKE 1 TABLET(50 MG) BY MOUTH DAILY WITH OR IMMEDIATELY FOLLOWING A MEAL 11/17/22   Bensimhon, Bevelyn Buckles, MD  potassium chloride SA (KLOR-CON M) 20 MEQ tablet Take 2 tablets (40 mEq total) by mouth daily. 12/27/21   Bensimhon, Bevelyn Buckles, MD  rosuvastatin (CRESTOR) 5 MG tablet Take 1 tablet (5 mg total) by mouth daily. 02/22/21 12/27/23  Bensimhon, Bevelyn Buckles, MD  torsemide (DEMADEX) 20 MG tablet Take 10 mg by mouth daily.    [provider]  vitamin C (ASCORBIC ACID) 250 MG tablet Take 250 mg by mouth daily.    [provider]      Allergies    Codeine and Tape    Review of Systems   Review of Systems  Genitourinary:  Positive for hematuria.  All other systems reviewed and are negative.   Physical Exam Updated Vital Signs BP (!) 179/78 (BP Location: Left Arm)   Pulse 80   Temp 98.1 F (36.7 C) (Oral)   Resp (!) 24   SpO2 98%  Physical Exam Vitals and nursing note reviewed.   Gen: NAD Eyes: PERRL, EOMI HEENT: no oropharyngeal swelling Neck: trachea  midline Resp: clear to auscultation bilaterally Card: RRR, no murmurs, rubs, or gallops Abd: nontender, nondistended Extremities: no calf tenderness, no edema Genital: circumcised male genitalia with some excoriation of the foreskin with no active bleeding noted Vascular: 2+ radial pulses bilaterally, 2+ DP pulses bilaterally Skin: no rashes Psyc: acting appropriately   ED Results / Procedures / Treatments   Labs (all labs ordered are listed, but only abnormal results are displayed) Labs Reviewed  URINALYSIS, ROUTINE W REFLEX MICROSCOPIC - Abnormal; Notable for the following components:      Result Value   Color, Urine STRAW (*)    Glucose, UA >=500 (*)    Hgb urine dipstick SMALL (*)    All other  components within normal limits  BASIC METABOLIC PANEL - Abnormal; Notable for the following components:   Potassium 3.3 (*)    Glucose, Bld 165 (*)    BUN 27 (*)    Creatinine, Ser 1.50 (*)    GFR, Estimated 45 (*)    All other components within normal limits  CBC    EKG EKG Interpretation Date/Time:  Friday August 11 2023 20:14:45 EST Ventricular Rate:  70 PR Interval:    QRS Duration:  144 QT Interval:  432 QTC Calculation: 467 R Axis:   -84  Text Interpretation: Atrial fibrillation Right bundle branch block Anterior infarct, age indeterminate Confirmed by Beckey Downing [phone]) on 08/11/2023 9:52:57 PM  Radiology CT ABDOMEN PELVIS WO CONTRAST Result Date: 08/11/2023 CLINICAL DATA:  Hematuria and abdominal pain, initial encounter EXAM: CT ABDOMEN AND PELVIS WITHOUT CONTRAST TECHNIQUE: Multidetector CT imaging of the abdomen and pelvis was performed following the standard protocol without IV contrast. RADIATION DOSE REDUCTION: This exam was performed according to the departmental dose-optimization program which includes automated exposure control, adjustment of the mA and/or kV according to patient size and/or use of iterative reconstruction technique. COMPARISON:  CT from 05/02/2012 FINDINGS: Lower chest: No acute abnormality. Heavy coronary calcifications are noted. Hepatobiliary: No focal liver abnormality is seen. Status post cholecystectomy. No biliary dilatation. Pancreas: Cystic changes are again seen in the tail of the pancreas similar to that noted on prior MRI from 2019. no new focal abnormality is seen. Spleen: Central arterial calcification is noted within the spleen. No other focal abnormality is seen. Adrenals/Urinary Tract: Adrenal glands are within normal limits. Nonobstructing calculi are noted bilaterally measuring up to 7 mm on the left. These of increased in number when compared with the prior exam although no size increase is seen. Scattered cysts are noted within the  kidneys bilaterally. When compared with the recent MRI from 2019 these are stable in appearance. No further follow-up is recommended. Bladder is well distended. Prostate indents upon the inferior aspect of the bladder. Stomach/Bowel: Scattered diverticular changes noted throughout the colon. No evidence of diverticulitis is seen. The appendix is within normal limits. Small bowel and stomach are unremarkable. Vascular/Lymphatic: Atherosclerotic calcifications of the abdominal aorta are noted. Increasing aneurysmal dilatation is seen when compared with the prior study now measuring up to 4 cm increased from 3.1 cm on the prior examination. No adenopathy is identified. Reproductive: Prostate is enlarged and indents upon the inferior aspect of the bladder. No discrete mass is seen. Other: No abdominal wall hernia or abnormality. No abdominopelvic ascites. Musculoskeletal: Chronic L1 compression deformity is noted. Multilevel degenerative changes are seen. IMPRESSION: Bilateral nonobstructing renal calculi measuring up to 7 mm on the left. Diverticulosis without diverticulitis. Abdominal aortic aneurysm measuring up to 4 cm. This has increased  in size from the prior exam at which time it measured 3.4 cm. Recommend follow-up CT or MR as appropriate in 12 months and referral to or continued care with vascular specialist. (Ref.: J Vasc Surg. 2018; 67:2-77 and J Am Coll Radiol 2013;10(10):789-794.) Stable cystic changes within the pancreas. Given their long-term stability, no further follow-up is recommended. Electronically Signed   By: Alcide Clever M.D.   On: 08/11/2023 22:16    Procedures Procedures    Medications Ordered in ED Medications - No data to display  ED Course/ Medical Decision Making/ A&P                                 Medical Decision Making 87 year old male with past medical history of atrial fibrillation on Eliquis presenting to the emergency department today with an episode of gross  hematuria earlier today.  I will further evaluate the patient here with basic labs Wels urinalysis about for urinary tract infection.  Given his age and with this being the first occurrence will obtain a CT scan of his abdomen without contrast to evaluate for mass lesion or ureteral stones.  The patient is otherwise stable hemodynamically.  I will reevaluate for ultimate disposition.  The patient's CT scan showed some renal stones but no ureteral stones.  There was an incidental finding of aortic aneurysm.  The patient is referred to vascular surgery.  His cardiologist had discussed discontinuing his Eliquis because there was come some concern for possible intermittent GI bleeding with patient is denying any blood in stool currently.  He is encouraged to hold this until he follows up with urology and to discuss this with his urologist and cardiologist to see when to start this back.  He is discharged with return precautions.  Amount and/or Complexity of Data Reviewed Labs: ordered. Radiology: ordered.           Final Clinical Impression(s) / ED Diagnoses Final diagnoses:  Hematuria, unspecified type  Aortic aneurysm without rupture, unspecified portion of aorta [facility])    Rx / DC Orders ED Discharge Orders     None         Durwin Glaze, MD 08/11/23 2225

## 2023-08-11 NOTE — ED Provider Triage Note (Signed)
Emergency Medicine Provider Triage Evaluation Note  Bransyn Zunich , a 87 y.o. male  was evaluated in triage.  Pt complains of hematuria. Painless hematuria a few hrs ago.  No abd pain, no back pain, no fever, chills body aches or recent trauma.  On eliquis for hx of afib.    Review of Systems  Positive: As above Negative: As above  Physical Exam  BP (!) 179/78 (BP Location: Left Arm)   Pulse 80   Temp 98.1 F (36.7 C) (Oral)   Resp (!) 24   SpO2 98%  Gen:   Awake, no distress   Resp:  Normal effort  MSK:   Moves extremities without difficulty  Other:    Medical Decision Making  Medically screening exam initiated at 8:55 PM.  Appropriate orders placed.  Yonis Riehl was informed that the remainder of the evaluation will be completed by another provider, this initial triage assessment does not replace that evaluation, and the importance of remaining in the ED until their evaluation is complete.     Fayrene Helper, PA-C 08/11/23 2056

## 2023-08-11 NOTE — ED Triage Notes (Signed)
Pt to ED by POV from home with c/o hematuria which began earlier today. Pt states he is on eliquis but that this is a new issues for him. Pt arrives hypertensive, all other VSS, pt is in a-fib in triage, no RVR noted. Denies any pain at this time.

## 2023-08-11 NOTE — ED Notes (Signed)
 Patient d/c with home care instructions. VS obtained.

## 2023-08-11 NOTE — Discharge Instructions (Signed)
Your workup today was reassuring.  If your cardiologist already talked about stopping the Eliquis I think it be reasonable to stop this until you are able to follow-up with the urologist.  Please drink plenty of fluids.  If you get to the point that you are unable to urinate or develop pain please return to the ER for reevaluation.  Your CT scan did show an aortic aneurysm which is enlargement of your aorta.  This should not be causing any symptoms currently.  Please follow-up with the vascular surgeon to have this monitored.  Please return to the ER for worsening symptoms.

## 2023-08-15 ENCOUNTER — Other Ambulatory Visit: Payer: Self-pay | Admitting: Internal Medicine

## 2023-08-18 DIAGNOSIS — R350 Frequency of micturition: Secondary | ICD-10-CM | POA: Diagnosis not present

## 2023-08-18 DIAGNOSIS — R310 Gross hematuria: Secondary | ICD-10-CM | POA: Diagnosis not present

## 2023-08-18 DIAGNOSIS — R351 Nocturia: Secondary | ICD-10-CM | POA: Diagnosis not present

## 2023-08-25 ENCOUNTER — Encounter (HOSPITAL_COMMUNITY): Payer: Self-pay | Admitting: Internal Medicine

## 2023-08-25 ENCOUNTER — Ambulatory Visit (HOSPITAL_COMMUNITY)
Admission: RE | Admit: 2023-08-25 | Discharge: 2023-08-25 | Disposition: A | Discharge status: Home / Self Care | Payer: Medicare Other | Source: Ambulatory Visit | Attending: Internal Medicine | Admitting: Internal Medicine

## 2023-08-25 VITALS — BP 140/70 | HR 45 | Wt 141.8 lb

## 2023-08-25 DIAGNOSIS — R310 Gross hematuria: Secondary | ICD-10-CM | POA: Diagnosis not present

## 2023-08-25 DIAGNOSIS — I493 Ventricular premature depolarization: Secondary | ICD-10-CM

## 2023-08-25 DIAGNOSIS — E119 Type 2 diabetes mellitus without complications: Secondary | ICD-10-CM | POA: Insufficient documentation

## 2023-08-25 DIAGNOSIS — R319 Hematuria, unspecified: Secondary | ICD-10-CM | POA: Diagnosis not present

## 2023-08-25 DIAGNOSIS — I482 Chronic atrial fibrillation, unspecified: Secondary | ICD-10-CM | POA: Diagnosis not present

## 2023-08-25 DIAGNOSIS — I2510 Atherosclerotic heart disease of native coronary artery without angina pectoris: Secondary | ICD-10-CM | POA: Diagnosis not present

## 2023-08-25 DIAGNOSIS — Z7182 Exercise counseling: Secondary | ICD-10-CM | POA: Diagnosis not present

## 2023-08-25 DIAGNOSIS — I714 Abdominal aortic aneurysm, without rupture, unspecified: Secondary | ICD-10-CM

## 2023-08-25 DIAGNOSIS — N62 Hypertrophy of breast: Secondary | ICD-10-CM | POA: Diagnosis not present

## 2023-08-25 DIAGNOSIS — Z9049 Acquired absence of other specified parts of digestive tract: Secondary | ICD-10-CM | POA: Insufficient documentation

## 2023-08-25 DIAGNOSIS — G4733 Obstructive sleep apnea (adult) (pediatric): Secondary | ICD-10-CM | POA: Diagnosis not present

## 2023-08-25 DIAGNOSIS — I5022 Chronic systolic (congestive) heart failure: Secondary | ICD-10-CM

## 2023-08-25 DIAGNOSIS — I110 Hypertensive heart disease with heart failure: Secondary | ICD-10-CM | POA: Insufficient documentation

## 2023-08-25 NOTE — Patient Instructions (Addendum)
 Great to see you today!!!  Please restart your Eliquis  Your physician recommends that you schedule a follow-up appointment in: 6 months (July), **PLEASE CALL OUR OFFICE IN MAY TO SCHEDULE THIS APPOINTMENT

## 2023-08-25 NOTE — Progress Notes (Signed)
 [facility] Note  Date:  08/25/2023   ID:  Ricardo [name], DOB [birth year], MRN [id number]  Location: Home  Provider location: [facility] Type of Visit: Established patient  PCP:  Roanna Ezekiel NOVAK, MD  Cardiologist:  None Primary HF: Joleena Weisenburger  Chief Complaint: Heart Failure follow-up   History of Present Illness:  Ricardo [name] is a 88y.o. male  with h/o HTN, diabetes, CAD and chronic atrial fibrillation.   Previously followed by Dr. Epifanio at [facility]. He is s/p previous AFL ablation. Echo 12/13 EF 40-45%. Had sleep study in past and placed on CPAP. He was subsequently told OSA was mild and CPAP stopped.    In January 2018 began to develop HF symptoms. AF rate elevated. Echo 40-45% with moderate RV dysfunction. Mild AS. Diuresed 9 pounds and placed on metoprolol  and digoxin  for HR control. Weight on d/c 145.   Cath 4/18 Very heavily calcified coronary arteries with high grade lesion in moderate-sized OM1 and tandem 70% and 80% lesions in mid LAD. Distal PDA 90%. Treated medically.   Echo 11/17/21 EF 45% Moderate RV dysfunction Mild AS severe  biatrial enlargement  Went to ED on 12/27 for hematuria. CT with non-obstructing stones. Small AAA (4.0cm) Had f/u with Scott McDiarmid in Urology. Cystoscopy negative. No further hematuria. Has not restarted Eliquis . Still going to the office every day. Not riding exercise bike much currently. Denies CP, SOB, edema. No ab pain.    cMRI 1/19   1) Mild LVE with moderate LVH Diffuse hypokinesis worse in the inferolateral wall. Quantitative EF 43% 2) SEMI seen in basal anterolateral and inferolateral walls as well as mid inferolateral wall 3) Severe LAE Moderate RAE 4) PFO present 5) Moderate appearing MR 6) Tri- leaflet AV with some thickening and restricted motion suggest echo correlation   Echo 11/06/16 LVEF 40%, Mild/Mod MR, Severe LAE, Severe RAE, + Patent foramen ovale   Cath  11/15/16 Very heavily calcified coronary arteries with high grade lesion in moderate-sized OM1 and tandem 70% and 80% lesions in mid LAD. Distal PDA 90%. Treated medically.    RA =  6 RV = 48/6 PA = 48/19 (29) PCW = 17 Ao = 135/89 (108) LV = 147/14 Fick cardiac output/index = 3.8/2.2 PVR = 3.2 SVR = 2131 FA sat = 98% PA sat = 67%. 69%   Past Medical History:  Diagnosis Date   Atrial fibrillation (HCC)    on coumadin .  cardiologist is in [facility] dr Dick Sauers.  S/P multiple DCCVs and prior ablation.    Basal cell carcinoma 12/23/2014   nod-left forearm (CX35FU)   BPH (benign prostatic hyperplasia)    by ST in 2000.    CHF (congestive heart failure) (HCC)    Complication of anesthesia     sometimes slow to wake up   Diabetes mellitus without complication (HCC)    Diverticulitis    question if he ever had imaging confirmed diverticulitis   GERD (gastroesophageal reflux disease)    GI bleed    Gout    Hypertension    Kidney stones    treated with lithotripsy    Pneumonia    PONV (postoperative nausea and vomiting)    Rupture of biceps tendon 2006   left distal bicep tendon   SCCA (squamous cell carcinoma) of skin 01/29/2020   Left Malar Cheek (in situ)   Squamous cell carcinoma of skin 04/08/2019   in situ-left cheek-txpbx   UTI (  urinary tract infection)    Past Surgical History:  Procedure Laterality Date   CARDIOVERSION     CATARACT EXTRACTION, BILATERAL     CHOLECYSTECTOMY     COLONOSCOPY  04/18/2012   Procedure: COLONOSCOPY;  Surgeon: Toribio SHAUNNA Cedar, MD;  Location: [facility] ENDOSCOPY;  Service: Endoscopy;  Laterality: N/A;   DISTAL BICEPS TENDON REPAIR  2006   dr freada   HEMORRHOID SURGERY     twice   HERNIA REPAIR     LITHOTRIPSY     of kidney stones.    PALATE / UVULA BIOPSY / EXCISION     uvula excision   RIGHT/LEFT HEART CATH AND CORONARY ANGIOGRAPHY N/A 11/15/2016   Procedure: Right/Left Heart Cath and Coronary Angiography;  Surgeon: Toribio JONELLE Fuel, MD;  Location: [facility] INVASIVE CV LAB;  Service: Cardiovascular;  Laterality: N/A;   thyroid  nodule excision       Current Outpatient Medications  Medication Sig Dispense Refill   b complex vitamins tablet Take 1 tablet by mouth daily.     Coenzyme Q10 (CO Q10) 200 MG CAPS Take 200 mcg by mouth daily.     cyanocobalamin  100 MCG tablet Take 1,000 mcg by mouth daily.     empagliflozin (JARDIANCE) 10 MG TABS tablet Take 10 mg by mouth daily.     ENTRESTO  24-26 MG TAKE 1 TABLET BY MOUTH TWICE DAILY 60 tablet 11   ipratropium (ATROVENT) 0.03 % nasal spray ipratropium bromide 21 mcg (0.03 %) nasal spray     meclizine (ANTIVERT) 12.5 MG tablet Take 12.5 mg by mouth 3 (three) times daily as needed for dizziness.     metoprolol  succinate (TOPROL -XL) 50 MG 24 hr tablet TAKE 1 TABLET(50 MG) BY MOUTH DAILY WITH OR IMMEDIATELY FOLLOWING A MEAL 90 tablet 3   potassium chloride  SA (KLOR-CON  M) 20 MEQ tablet Take 2 tablets (40 mEq total) by mouth daily. 180 tablet 3   rosuvastatin  (CRESTOR ) 5 MG tablet Take 1 tablet (5 mg total) by mouth daily. 90 tablet 3   torsemide (DEMADEX) 20 MG tablet Take 10 mg by mouth daily.     vitamin C  (ASCORBIC ACID ) 250 MG tablet Take 250 mg by mouth daily.     ELIQUIS  2.5 MG TABS tablet TAKE 1 TABLET(2.5 MG) BY MOUTH TWICE DAILY (Patient not taking: Reported on 08/25/2023) 60 tablet 11   eplerenone  (INSPRA ) 25 MG tablet Take 0.5 tablets (12.5 mg total) by mouth daily. (Patient not taking: Reported on 08/25/2023) 30 tablet 6   No current facility-administered medications for this encounter.    Allergies:   Codeine and Tape   Social History:  The patient  reports that he has never smoked. He has never used smokeless tobacco. He reports current alcohol use. He reports that he does not use drugs.   Family History:  The patient's family history includes Congestive Heart Failure in his father; Stroke in his father; Tuberculosis in his mother.   ROS:  Please see the  history of present illness.   All other systems are personally reviewed and negative.   Vitals:   08/25/23 1228  BP: (!) 140/70  Pulse: (!) 45  SpO2: 95%  Weight: 64.3 kg (141 lb 12.8 oz)     Exam:  General:  Elderly. No resp difficulty HEENT: normal Neck: supple. no JVD. Carotids 2+ bilat; no bruits. No lymphadenopathy or thryomegaly appreciated. Cor: Mildly irregular rate & rhythm.2/6 AS Lungs: clear Abdomen: soft, nontender, nondistended. No hepatosplenomegaly. No bruits or masses. Good bowel  sounds. Extremities: no cyanosis, clubbing, rash, edema Neuro: alert & orientedx3, cranial nerves grossly intact. moves all 4 extremities w/o difficulty. Affect pleasant  ECG: AF 71 RBBB Personally reviewed   Recent Labs: 08/11/2023: BUN 27; Creatinine, Ser 1.50; Hemoglobin 13.9; Platelets 178; Potassium 3.3; Sodium 137  Personally reviewed   Wt Readings from Last 3 Encounters:  08/25/23 64.3 kg (141 lb 12.8 oz)  12/27/22 66.2 kg (146 lb)  11/17/21 63.3 kg (139 lb 9.6 oz)      ASSESSMENT AND PLAN:  1. Chronic systolic HF - Echo 11/06/16 EF 59-54% with moderate RV dysfunction. Seems out of proportion to CAD. ? PVC related with 10% PVCs on Holter 9/18. - SPEP negative. CMRI 2019 no infiltrative process EF 43% - Echo 9/21 EF 40-45% RV mildly down  AoV sclerosis without stenosis.  - Echo  11/17/21 EF 45% Moderate RV dysfunction Mild AS severe  biatrial enlargement - Continues to do very well.  NYHA I-II - Encouraged him to restart his exercise program - Volume status looks good. Continue torsemide 10mg  daily - Continue Toprol  50mg  daily. - Continue entresto  to 49-51 mg twice a day. Have not titrated due to intermittent lightheadedness.  - On eplerenone  12.5 daily. Unable to tolerate spiro due to severe gynecomastia. - Continue Jardiance 10  2. Chronic AF with slow VR - Rate controlles - HR typically in 50- 70s.  - CHADSVASC = 6 (age, DM, HTN, HF, vasc disease) - stroke risk 9.7%  year - Off Eliquis  due to hematuria - We discussed risks/benefits of AC and I have strongly encouraged him to restart Eliquis  2.5 bid   3. Mild aortic stenosis - Mild on echo  - Repeat echo in 6 months  4. HTN  - Blood pressure well controlled. Continue current regimen.   5. DM2 - Per PCP. Now on Jardiance   6. Suspected sleep apnea.  - Sleep study was negative.     7. CAD - He has highly calcified coronary arteries with borderline lesion in mLAD.  - No s/s angina. Continue medical therapy with statin. Off ASA with Eliquis   8. PVCs.  - 10% PVCs on Holter 9/18 - EF has been stable despite PVCs. IF ef dropping will need to repeat monitor to reassess PVC burden  9. AAA - CT 12/24 4.0 cm - will follow with u/s in 6 months.  - if has more hematuria can consider ab MRI to cover AAA and further evaluate hematuria   Signed, Toribio Fuel, MD  08/25/2023 12:55 PM  [facility] [address] [facility] [place] [address] [phone] (office) [phone] (fax)

## 2023-09-29 DIAGNOSIS — E782 Mixed hyperlipidemia: Secondary | ICD-10-CM | POA: Diagnosis not present

## 2023-09-29 DIAGNOSIS — E1165 Type 2 diabetes mellitus with hyperglycemia: Secondary | ICD-10-CM | POA: Diagnosis not present

## 2023-09-29 DIAGNOSIS — E790 Hyperuricemia without signs of inflammatory arthritis and tophaceous disease: Secondary | ICD-10-CM | POA: Diagnosis not present

## 2023-09-29 DIAGNOSIS — N1831 Chronic kidney disease, stage 3a: Secondary | ICD-10-CM | POA: Diagnosis not present

## 2023-09-30 LAB — LAB REPORT - SCANNED
A1c: 7.4
Calcium: 9.3
EGFR: 49

## 2023-10-04 NOTE — Therapy (Incomplete)
OUTPATIENT PHYSICAL THERAPY VESTIBULAR EVALUATION     Patient Name: Ricardo [name] MRN: [id number] DOB:[birth year], 88 y.o., male Today's Date: 10/04/2023  END OF SESSION:   Past Medical History:  Diagnosis Date   Atrial fibrillation (HCC)    on coumadin.  cardiologist is in [facility] dr Duard Brady.  S/P multiple DCCVs and prior ablation.    Basal cell carcinoma 12/23/2014   nod-left forearm (CX35FU)   BPH (benign prostatic hyperplasia)    by ST in 2000.    CHF (congestive heart failure) (HCC)    Complication of anesthesia    " sometimes slow to wake up"   Diabetes mellitus without complication (HCC)    Diverticulitis    question if he ever had imaging confirmed diverticulitis   GERD (gastroesophageal reflux disease)    GI bleed    Gout    Hypertension    Kidney stones    treated with lithotripsy    Pneumonia    PONV (postoperative nausea and vomiting)    Rupture of biceps tendon 2006   left distal bicep tendon   SCCA (squamous cell carcinoma) of skin 01/29/2020   Left Malar Cheek (in situ)   Squamous cell carcinoma of skin 04/08/2019   in situ-left cheek-txpbx   UTI (urinary tract infection)    Past Surgical History:  Procedure Laterality Date   CARDIOVERSION     CATARACT EXTRACTION, BILATERAL     CHOLECYSTECTOMY     COLONOSCOPY  04/18/2012   Procedure: COLONOSCOPY;  Surgeon: Rachael Fee, MD;  Location: [facility] ENDOSCOPY;  Service: Endoscopy;  Laterality: N/A;   DISTAL BICEPS TENDON REPAIR  2006   dr Pecolia Ades   HEMORRHOID SURGERY     twice   HERNIA REPAIR     LITHOTRIPSY     of kidney stones.    PALATE / UVULA BIOPSY / EXCISION     uvula excision   RIGHT/LEFT HEART CATH AND CORONARY ANGIOGRAPHY N/A 11/15/2016   Procedure: Right/Left Heart Cath and Coronary Angiography;  Surgeon: Dolores [name], MD;  Location: [facility] INVASIVE CV LAB;  Service: Cardiovascular;  Laterality: N/A;   thyroid nodule excision     Patient Active Problem List   Diagnosis Date  Noted   Hypertension 02/07/2017   Chronic systolic congestive heart failure (HCC) 11/19/2016   Orthopnea 11/05/2016   Edema 11/05/2016   Diverticulosis 04/18/2012   Chronic anticoagulation 04/17/2012   Atrial fibrillation (HCC)    Diverticulitis    Kidney stones    BPH (benign prostatic hyperplasia)     PCP: Harvest Forest, MD  REFERRING PROVIDER: Harvest Forest, MD  REFERRING DIAG: H81.10 (ICD-10-CM) - Benign paroxysmal vertigo, unspecified ear  THERAPY DIAG:  No diagnosis found.  ONSET DATE: ***  Rationale for Evaluation and Treatment: Rehabilitation  SUBJECTIVE:   SUBJECTIVE STATEMENT: *** Pt accompanied by: {accompnied:27141}  PERTINENT HISTORY: A-fib, CHF, DM, gout, HTN, biceps tendon repair 2006  PAIN:  Are you having pain? {OPRCPAIN:27236}  PRECAUTIONS: {Therapy precautions:24002}  RED FLAGS: {PT Red Flags:29287}   WEIGHT BEARING RESTRICTIONS: {Yes ***/No:24003}  FALLS: Has patient fallen in last 6 months? {fallsyesno:27318}  LIVING ENVIRONMENT: Lives with: {OPRC lives with:25569::"lives with their family"} Lives in: {Lives in:25570} Stairs: {opstairs:27293} Has following equipment at home: {Assistive devices:23999}  PLOF: {PLOF:24004}  PATIENT GOALS: ***  OBJECTIVE:  Note: Objective measures were completed at Evaluation unless otherwise noted.  DIAGNOSTIC FINDINGS: none recent  COGNITION: Overall cognitive status: {cognition:24006}   SENSATION: {sensation:27233}  POSTURE:  {posture:25561}  GAIT:  Gait pattern: {gait characteristics:25376} Distance walked: *** Assistive device utilized: {Assistive devices:23999} Level of assistance: {Levels of assistance:24026} Comments: ***  FUNCTIONAL TESTS:  {Functional tests:24029}  PATIENT SURVEYS:  DHI ***  VESTIBULAR ASSESSMENT:  GENERAL OBSERVATION: ***   OCULOMOTOR EXAM:  Ocular Alignment: {Ocular Alignment:25262}  Ocular ROM: {RANGE OF MOTION:21649}  Spontaneous  Nystagmus: {Spontaneous nystagmus:25263}  Gaze-Induced Nystagmus: {gaze-induced nystagmus:25264}  Smooth Pursuits: {smooth pursuit:25265}  Saccades: {saccades:25266}  Convergence/Divergence: *** cm    VESTIBULAR - OCULAR REFLEX:   Slow VOR: {slow VOR:25290}  VOR Cancellation: {vor cancellation:25291}  Head-Impulse Test: {head impulse test:25272}  Dynamic Visual Acuity: {dynamic visual acuity:25273}    POSITIONAL TESTING:  Right Roll Test: ***; Duration: *** Left Roll Test: ***; Duration: *** Right Dix-Hallpike: ***; Duration:*** Left Dix-Hallpike: ***; Duration: *** Right Sidelying: ***; Duration: *** Left Sidelying: ***; Duration: *** Deep Head Hang: ***; Duration: *** Other: ***   MOTION SENSITIVITY:  Motion Sensitivity Quotient Intensity: 0 = none, 1 = Lightheaded, 2 = Mild, 3 = Moderate, 4 = Severe, 5 = Vomiting  Intensity  1. Sitting to supine   2. Supine to L side   3. Supine to R side   4. Supine to sitting   5. L Hallpike-Dix   6. Up from L    7. R Hallpike-Dix   8. Up from R    9. Sitting, head tipped to L knee   10. Head up from L knee   11. Sitting, head tipped to R knee   12. Head up from R knee   13. Sitting head turns x5   14.Sitting head nods x5   15. In stance, 180 turn to L    16. In stance, 180 turn to R     OTHOSTATICS: {Exam; orthostatics:31331}  FUNCTIONAL GAIT: {Functional tests:24029}                                                                                                                             TREATMENT DATE: ***   Canalith Repositioning:  {Canalith Repositioning:25283} Gaze Adaptation:  {gaze adaptation:25286} Habituation:  {habituation:25288} Other: ***  PATIENT EDUCATION: Education details: *** Person educated: {Person educated:25204} Education method: {Education Method:25205} Education comprehension: {Education Comprehension:25206}  HOME EXERCISE PROGRAM:   GOALS: Goals reviewed with patient?  Yes  SHORT TERM GOALS: Target date: {follow up:25551}  Patient to be independent with initial HEP. Baseline: HEP initiated Goal status: {GOALSTATUS:25110}    LONG TERM GOALS: Target date: {follow up:25551}  Patient to be independent with advanced HEP. Baseline: Not yet initiated  Goal status: {GOALSTATUS:25110}  Patient to report 0/10 dizziness with standing vertical and horizontal VOR for 30 seconds. Baseline: Unable Goal status: {GOALSTATUS:25110}  Patient will report 0/10 dizziness with bed mobility.  Baseline: Symptomatic  Goal status: {GOALSTATUS:25110}  Patient to demonstrate *** sway with M-CTSIB condition with eyes closed/foam surface in order to improve safety in environments with uneven surfaces and dim lighting. Baseline: *** Goal status: {GOALSTATUS:25110}  Patient to score at least 20/24 on DGI in order to decrease risk of falls. Baseline: *** Goal status: {GOALSTATUS:25110}  Patient will ambulate over outdoor surfaces with LRAD while performing head turns to scan environment with good stability in order to indicate safe community mobility. Baseline: Unable Goal status: {GOALSTATUS:25110}  Patient to score at least 18 points less on DHI in order to meet MCID and improve functional outcomes.  Baseline: *** Goal status: {GOALSTATUS:25110}   ASSESSMENT:  CLINICAL IMPRESSION:   Patient is an 88 y/o M presenting to OPPT with c/o dizziness for the past ***.   Denies head trauma, infection/illness, vision changes/double vision, hearing loss, tinnitus, otalgia, photo/phonophobia.  Oculomotor exam revealed ***.  Positional testing was ***.   Patient was educated on gentle *** HEP and reported understanding. Would benefit from skilled PT services ***x/week for *** weeks to address aforementioned impairments in order to optimize level of function.     OBJECTIVE IMPAIRMENTS: {opptimpairments:25111}.   ACTIVITY LIMITATIONS:  {activitylimitations:27494}  PARTICIPATION LIMITATIONS: {participationrestrictions:25113}  PERSONAL FACTORS: {Personal factors:25162} are also affecting patient's functional outcome.   REHAB POTENTIAL: {rehabpotential:25112}  CLINICAL DECISION MAKING: {clinical decision making:25114}  EVALUATION COMPLEXITY: {Evaluation complexity:25115}   PLAN:  PT FREQUENCY: {rehab frequency:25116}  PT DURATION: {rehab duration:25117}  PLANNED INTERVENTIONS: {rehab planned interventions:25118::"97110-Therapeutic exercises","97530- Therapeutic [phone]- Neuromuscular re-education","97535- Self JXBJ","47829- Manual therapy"}  PLAN FOR NEXT SESSION: ***

## 2023-10-05 ENCOUNTER — Ambulatory Visit: Payer: Medicare Other | Admitting: Physical Therapy

## 2023-10-06 DIAGNOSIS — N1831 Chronic kidney disease, stage 3a: Secondary | ICD-10-CM | POA: Diagnosis not present

## 2023-10-06 DIAGNOSIS — I5022 Chronic systolic (congestive) heart failure: Secondary | ICD-10-CM | POA: Diagnosis not present

## 2023-10-06 DIAGNOSIS — R413 Other amnesia: Secondary | ICD-10-CM | POA: Diagnosis not present

## 2023-10-06 DIAGNOSIS — E1165 Type 2 diabetes mellitus with hyperglycemia: Secondary | ICD-10-CM | POA: Diagnosis not present

## 2023-10-06 DIAGNOSIS — I482 Chronic atrial fibrillation, unspecified: Secondary | ICD-10-CM | POA: Diagnosis not present

## 2023-10-06 DIAGNOSIS — H8110 Benign paroxysmal vertigo, unspecified ear: Secondary | ICD-10-CM | POA: Diagnosis not present

## 2023-10-06 DIAGNOSIS — W19XXXD Unspecified fall, subsequent encounter: Secondary | ICD-10-CM | POA: Diagnosis not present

## 2023-10-06 DIAGNOSIS — I10 Essential (primary) hypertension: Secondary | ICD-10-CM | POA: Diagnosis not present

## 2023-10-06 DIAGNOSIS — M7918 Myalgia, other site: Secondary | ICD-10-CM | POA: Diagnosis not present

## 2023-10-06 DIAGNOSIS — E1142 Type 2 diabetes mellitus with diabetic polyneuropathy: Secondary | ICD-10-CM | POA: Diagnosis not present

## 2023-10-06 DIAGNOSIS — R310 Gross hematuria: Secondary | ICD-10-CM | POA: Diagnosis not present

## 2023-10-06 DIAGNOSIS — D692 Other nonthrombocytopenic purpura: Secondary | ICD-10-CM | POA: Diagnosis not present

## 2023-10-09 NOTE — Therapy (Signed)
 OUTPATIENT PHYSICAL THERAPY VESTIBULAR EVALUATION     Patient Name: Ricardo [name] MRN: [id number] DOB:11-Jan-1936, 88 y.o., male Today's Date: 10/11/2023  END OF SESSION:  PT End of Session - 10/11/23 1448     Visit Number 1    Number of Visits 13    Date for PT Re-Evaluation 11/22/23    Authorization Type Medicare/BCBS    PT Start Time 1356    PT Stop Time 1442    PT Time Calculation (min) 46 min    Equipment Utilized During Treatment Gait belt    Activity Tolerance Patient tolerated treatment well    Behavior During Therapy WFL for tasks assessed/performed             Past Medical History:  Diagnosis Date   Atrial fibrillation (HCC)    on coumadin.  cardiologist is in [facility] dr Duard Brady.  S/P multiple DCCVs and prior ablation.    Basal cell carcinoma 12/23/2014   nod-left forearm (CX35FU)   BPH (benign prostatic hyperplasia)    by ST in 2000.    CHF (congestive heart failure) (HCC)    Complication of anesthesia    " sometimes slow to wake up"   Diabetes mellitus without complication (HCC)    Diverticulitis    question if he ever had imaging confirmed diverticulitis   GERD (gastroesophageal reflux disease)    GI bleed    Gout    Hypertension    Kidney stones    treated with lithotripsy    Pneumonia    PONV (postoperative nausea and vomiting)    Rupture of biceps tendon 2006   left distal bicep tendon   SCCA (squamous cell carcinoma) of skin 01/29/2020   Left Malar Cheek (in situ)   Squamous cell carcinoma of skin 04/08/2019   in situ-left cheek-txpbx   UTI (urinary tract infection)    Past Surgical History:  Procedure Laterality Date   CARDIOVERSION     CATARACT EXTRACTION, BILATERAL     CHOLECYSTECTOMY     COLONOSCOPY  04/18/2012   Procedure: COLONOSCOPY;  Surgeon: Rachael Fee, MD;  Location: [facility] ENDOSCOPY;  Service: Endoscopy;  Laterality: N/A;   DISTAL BICEPS TENDON REPAIR  2006   dr Pecolia Ades   HEMORRHOID SURGERY     twice   HERNIA  REPAIR     LITHOTRIPSY     of kidney stones.    PALATE / UVULA BIOPSY / EXCISION     uvula excision   RIGHT/LEFT HEART CATH AND CORONARY ANGIOGRAPHY N/A 11/15/2016   Procedure: Right/Left Heart Cath and Coronary Angiography;  Surgeon: Dolores [name], MD;  Location: [facility] INVASIVE CV LAB;  Service: Cardiovascular;  Laterality: N/A;   thyroid nodule excision     Patient Active Problem List   Diagnosis Date Noted   Hypertension 02/07/2017   Chronic systolic congestive heart failure (HCC) 11/19/2016   Orthopnea 11/05/2016   Edema 11/05/2016   Diverticulosis 04/18/2012   Chronic anticoagulation 04/17/2012   Atrial fibrillation (HCC)    Diverticulitis    Kidney stones    BPH (benign prostatic hyperplasia)     PCP: Harvest Forest, MD  REFERRING PROVIDER: Harvest Forest, MD  REFERRING DIAG: H81.10 (ICD-10-CM) - Benign paroxysmal vertigo, unspecified ear  THERAPY DIAG:  Unsteadiness on feet  Other abnormalities of gait and mobility  ONSET DATE: years   Rationale for Evaluation and Treatment: Rehabilitation  SUBJECTIVE:   SUBJECTIVE STATEMENT: Patient reports imbalance has occurred for years. Denies dizziness.  Reports this  he is unable to stand with EC in shower. Not using a cane or AD. Reports that he had a fall about a month ago when walking in the dark. Denies injuries besides being a little sore. Denies hx of head trauma or migraines.    Pt accompanied by: self  PERTINENT HISTORY: A-fib, CHF, DM, gout, HTN, biceps tendon repair 2006  PAIN:  Are you having pain? No  PRECAUTIONS: Fall  RED FLAGS: None   WEIGHT BEARING RESTRICTIONS: No  FALLS: Has patient fallen in last 6 months? Yes. Number of falls 1  LIVING ENVIRONMENT: Lives with: lives with their spouse Lives in: House/apartment Stairs:  elevator to enter and to several stories  Has following equipment at home: Shower bench  PLOF: Independent; working full time- requires sitting  PATIENT GOALS:  improve balance   OBJECTIVE:  Note: Objective measures were completed at Evaluation unless otherwise noted.  DIAGNOSTIC FINDINGS: none recent  COGNITION: Overall cognitive status: Within functional limits for tasks assessed   SENSATION: Pt denies UE/LE N/T  POSTURE:  Flexed neck, rounded shoulders, forward head, and increased thoracic kyphosis  GAIT: Gait pattern: wide BOS with increased B toe out; slightly unsteady  Assistive device utilized: None Level of assistance: SBA   FUNCTIONAL TESTS:   M-CTSIB  Condition 1: Firm Surface, EO 30 Sec, Mild Sway  Condition 2: Firm Surface, EC 30 Sec, Moderate Sway  Condition 3: Foam Surface, EO 30 Sec, Moderate Sway  Condition 4: Foam Surface, EC 7 Sec, Severe Sway    Romberg + head turns/nods: unable to maintain stability     COORDINATION:  Alternating pronation/supination: slowed B Alternating toe tap: difficulty coordinating initially, requiring instruction. Able to perform after cueing and demo  Finger to nose: slowed on L   VESTIBULAR ASSESSMENT:  GENERAL OBSERVATION: pt wears bifocals    OCULOMOTOR EXAM:  Ocular Alignment: normal  Ocular ROM: No Limitations  Spontaneous Nystagmus: absent  Gaze-Induced Nystagmus: absent  Smooth Pursuits: intact  Saccades: hypometric/undershoots and slow horizontal and vertical  Convergence/Divergence: L convergence insufficiency at ~2 cm (normal)   VESTIBULAR - OCULAR REFLEX:   Slow VOR: Normal and Comment: slow, very small movements   VOR Cancellation: Corrective Saccades to B directions   Head-Impulse Test: HIT Right: negative HIT Left: negative *reports a pop in the L side of neck without pain     POSITIONAL TESTING:  Right Roll Test: small amplitude apogeotropic nystagmus lasting ~30sec; pt asymptomatic  Left Roll Test: L downbeating torsional nystagmus persisting; pt asymptomatic  Right Dix-Hallpike: apogeotropic nystagmus changing to L downbeating torsional nystagmus  persisting; pt asymptomatic  Left Dix-Hallpike: L downbeating torsional nystagmus persisting; pt asymptomatic                                                                                                                                 TREATMENT DATE: 10/11/23   PATIENT EDUCATION: Education details: exam findings- advised patient that PCP  would be made aware of central signs today, prognosis, POC, HEP with edu on safety, answered pt's questions Person educated: Patient Education method: Explanation, Demonstration, Tactile cues, Verbal cues, and Handouts Education comprehension: verbalized understanding and returned demonstration  HOME EXERCISE PROGRAM: Access Code: [email or web address] URL: [email or web address] Date: 10/11/2023 Prepared by: [facility] - [facility]  Program Notes perform activities in a corner with a chair in front of you for safety  Exercises - Romberg Stance  - 1 x daily - 5 x weekly - 2 sets - 30 sec hold - Romberg Stance with Eyes Closed  - 1 x daily - 5 x weekly - 2 sets - 30 sec hold   GOALS: Goals reviewed with patient? Yes  SHORT TERM GOALS: Target date: 11/01/2023  Patient to be independent with initial HEP. Baseline: HEP initiated Goal status: INITIAL    LONG TERM GOALS: Target date: 11/22/2023  Patient to be independent with advanced HEP. Baseline: Not yet initiated  Goal status: INITIAL  Patient to report understanding of fall prevention information.  Baseline: not yet initiated Goal status: INITIAL  Patient to demonstrate balance for 30 sec with M-CTSIB condition with eyes closed/foam surface in order to improve safety in environments with uneven surfaces and dim lighting. Baseline: 7 sec before LOB Goal status: INITIAL  Patient to score at least 20/24 on DGI in order to decrease risk of falls. Baseline: NT Goal status: INITIAL  Patient will ambulate over outdoor surfaces with LRAD while performing head  turns to scan environment with good stability in order to indicate safe community mobility. Baseline: NT Goal status: INITIAL  ASSESSMENT:  CLINICAL IMPRESSION:   Patient is an 88 y/o M presenting to OPPT with c/o imbalance for years. Reports 1 fall a month ago and denies hx of head trauma or migraines.    Patient today presenting with rounded and flexed posture, gait deviations, imbalance, slowed UE coordination testing and difficulty completing LE coordination testing, slowed and hypometric saccades, abnormal VOR cancellation, and positional nystagmus without dizziness. D/t patient's central signs on exam, vestibular agnosia is unlikely, thus will not treat for BPPV unless symptoms change. Patient was educated on balance HEP with edu on safety and reported understanding. Would benefit from skilled PT services 1-2x/week for 6 weeks to address aforementioned impairments in order to optimize level of function.     OBJECTIVE IMPAIRMENTS: Abnormal gait, decreased balance, decreased coordination, and postural dysfunction.   ACTIVITY LIMITATIONS: carrying, lifting, bending, standing, squatting, stairs, transfers, bathing, toileting, dressing, reach over head, and hygiene/grooming  PARTICIPATION LIMITATIONS: meal prep, cleaning, laundry, shopping, community activity, occupation, and church  PERSONAL FACTORS: Age, Past/current experiences, Time since onset of injury/illness/exacerbation, and 3+ comorbidities: A-fib, CHF, DM, gout, HTN, biceps tendon repair 2006  are also affecting patient's functional outcome.   REHAB POTENTIAL: Good  CLINICAL DECISION MAKING: Unstable/unpredictable  EVALUATION COMPLEXITY: High   PLAN:  PT FREQUENCY: 1-2x/week  PT DURATION: 6 weeks  PLANNED INTERVENTIONS: 97164- PT Re-evaluation, 97110-Therapeutic exercises, 97530- Therapeutic activity, 97112- Neuromuscular re-education, 97535- Self Care, 78469- Manual therapy, [phone]- Gait training, [phone]- Canalith  repositioning, Patient/Family education, Balance training, Stair training, Taping, Dry Needling, Joint mobilization, Spinal mobilization, Vestibular training, DME instructions, Cryotherapy, and Moist heat  PLAN FOR NEXT SESSION: DGI, progress HEP with narrow BOS, head turns/nods, EC, compliant surface, coordination challenges   Baldemar Friday, PT, DPT 10/11/23 3:00 PM  [facility] at [facility] [address] [place], [place] [address]  Phone # [phone] Fax # [phone]

## 2023-10-10 ENCOUNTER — Other Ambulatory Visit (HOSPITAL_COMMUNITY): Payer: Self-pay | Admitting: Cardiology

## 2023-10-10 MED ORDER — EPLERENONE 25 MG PO TABS: 12.5000 mg | ORAL_TABLET | Freq: Every day | ORAL | 3 refills | Status: AC

## 2023-10-11 ENCOUNTER — Other Ambulatory Visit: Payer: Self-pay

## 2023-10-11 ENCOUNTER — Telehealth: Payer: Self-pay | Admitting: Physical Therapy

## 2023-10-11 ENCOUNTER — Encounter: Payer: Self-pay | Admitting: Physical Therapy

## 2023-10-11 ENCOUNTER — Ambulatory Visit: Payer: Medicare Other | Attending: Internal Medicine | Admitting: Physical Therapy

## 2023-10-11 DIAGNOSIS — R2681 Unsteadiness on feet: Secondary | ICD-10-CM | POA: Insufficient documentation

## 2023-10-11 DIAGNOSIS — R2689 Other abnormalities of gait and mobility: Secondary | ICD-10-CM | POA: Diagnosis not present

## 2023-10-11 NOTE — Telephone Encounter (Signed)
 Hi Dr. Corky Downs,  I evaluated Mr. Mcjunkins today in OPPT for c/o imbalance today per your referral. He presented with some abnormal oculomotor and coordination testing including: slowed UE coordination testing and difficulty completing LE coordination testing, slowed and hypometric saccades, abnormal VOR cancellation, and positional nystagmus without dizziness. I do not believe he has BPPV. Believe he may benefit from further CNS workup or referral to Neuro. However, we are glad to see him and work on balance. I see that in your note on 07/27/23 you had recommended brain MRI but patient was not agreeable at that time. Please advise.  Thanks!  Baldemar Friday, PT, DPT

## 2023-10-17 ENCOUNTER — Ambulatory Visit: Payer: Medicare Other

## 2023-10-19 ENCOUNTER — Ambulatory Visit: Payer: Medicare Other | Admitting: Physical Therapy

## 2023-10-24 ENCOUNTER — Ambulatory Visit: Payer: Medicare Other | Admitting: Physical Therapy

## 2023-10-24 DIAGNOSIS — N1831 Chronic kidney disease, stage 3a: Secondary | ICD-10-CM | POA: Diagnosis not present

## 2023-10-24 DIAGNOSIS — N189 Chronic kidney disease, unspecified: Secondary | ICD-10-CM | POA: Diagnosis not present

## 2023-10-24 DIAGNOSIS — N2581 Secondary hyperparathyroidism of renal origin: Secondary | ICD-10-CM | POA: Diagnosis not present

## 2023-10-24 DIAGNOSIS — I129 Hypertensive chronic kidney disease with stage 1 through stage 4 chronic kidney disease, or unspecified chronic kidney disease: Secondary | ICD-10-CM | POA: Diagnosis not present

## 2023-10-24 DIAGNOSIS — D631 Anemia in chronic kidney disease: Secondary | ICD-10-CM | POA: Diagnosis not present

## 2023-10-26 ENCOUNTER — Ambulatory Visit: Payer: Medicare Other | Admitting: Physical Therapy

## 2023-10-31 ENCOUNTER — Ambulatory Visit: Payer: Medicare Other | Admitting: Physical Therapy

## 2023-11-02 ENCOUNTER — Ambulatory Visit: Payer: Medicare Other

## 2023-11-03 DIAGNOSIS — N1831 Chronic kidney disease, stage 3a: Secondary | ICD-10-CM | POA: Diagnosis not present

## 2023-11-07 ENCOUNTER — Ambulatory Visit: Payer: Medicare Other | Admitting: Physical Therapy

## 2023-11-08 ENCOUNTER — Other Ambulatory Visit (HOSPITAL_COMMUNITY): Payer: Self-pay

## 2023-11-08 DIAGNOSIS — I5022 Chronic systolic (congestive) heart failure: Secondary | ICD-10-CM

## 2023-11-08 MED ORDER — METOPROLOL SUCCINATE ER 50 MG PO TB24
ORAL_TABLET | ORAL | 1 refills | Status: DC
Start: 2023-11-08 — End: 2024-02-19

## 2023-11-09 ENCOUNTER — Ambulatory Visit: Payer: Medicare Other | Admitting: Physical Therapy

## 2023-11-13 ENCOUNTER — Other Ambulatory Visit (HOSPITAL_COMMUNITY): Payer: Self-pay | Admitting: Internal Medicine

## 2023-11-15 DIAGNOSIS — Z85828 Personal history of other malignant neoplasm of skin: Secondary | ICD-10-CM | POA: Diagnosis not present

## 2023-11-15 DIAGNOSIS — L821 Other seborrheic keratosis: Secondary | ICD-10-CM | POA: Diagnosis not present

## 2023-11-15 DIAGNOSIS — Z08 Encounter for follow-up examination after completed treatment for malignant neoplasm: Secondary | ICD-10-CM | POA: Diagnosis not present

## 2023-11-15 DIAGNOSIS — D225 Melanocytic nevi of trunk: Secondary | ICD-10-CM | POA: Diagnosis not present

## 2023-11-15 DIAGNOSIS — L814 Other melanin hyperpigmentation: Secondary | ICD-10-CM | POA: Diagnosis not present

## 2023-11-15 DIAGNOSIS — L570 Actinic keratosis: Secondary | ICD-10-CM | POA: Diagnosis not present

## 2023-12-19 ENCOUNTER — Other Ambulatory Visit (HOSPITAL_COMMUNITY): Payer: Self-pay | Admitting: Internal Medicine

## 2024-02-01 DIAGNOSIS — E559 Vitamin D deficiency, unspecified: Secondary | ICD-10-CM | POA: Diagnosis not present

## 2024-02-01 DIAGNOSIS — E1165 Type 2 diabetes mellitus with hyperglycemia: Secondary | ICD-10-CM | POA: Diagnosis not present

## 2024-02-01 DIAGNOSIS — R159 Full incontinence of feces: Secondary | ICD-10-CM | POA: Diagnosis not present

## 2024-02-01 DIAGNOSIS — I10 Essential (primary) hypertension: Secondary | ICD-10-CM | POA: Diagnosis not present

## 2024-02-01 DIAGNOSIS — E782 Mixed hyperlipidemia: Secondary | ICD-10-CM | POA: Diagnosis not present

## 2024-02-01 DIAGNOSIS — G3184 Mild cognitive impairment, so stated: Secondary | ICD-10-CM | POA: Diagnosis not present

## 2024-02-01 DIAGNOSIS — I5022 Chronic systolic (congestive) heart failure: Secondary | ICD-10-CM | POA: Diagnosis not present

## 2024-02-01 DIAGNOSIS — N1831 Chronic kidney disease, stage 3a: Secondary | ICD-10-CM | POA: Diagnosis not present

## 2024-02-01 DIAGNOSIS — I4891 Unspecified atrial fibrillation: Secondary | ICD-10-CM | POA: Diagnosis not present

## 2024-02-08 ENCOUNTER — Other Ambulatory Visit (HOSPITAL_COMMUNITY): Payer: Self-pay | Admitting: Internal Medicine

## 2024-02-17 ENCOUNTER — Other Ambulatory Visit (HOSPITAL_COMMUNITY): Payer: Self-pay | Admitting: Internal Medicine

## 2024-02-17 DIAGNOSIS — I5022 Chronic systolic (congestive) heart failure: Secondary | ICD-10-CM

## 2024-04-25 DIAGNOSIS — E103393 Type 1 diabetes mellitus with moderate nonproliferative diabetic retinopathy without macular edema, bilateral: Secondary | ICD-10-CM | POA: Diagnosis not present

## 2024-04-25 DIAGNOSIS — H353132 Nonexudative age-related macular degeneration, bilateral, intermediate dry stage: Secondary | ICD-10-CM | POA: Diagnosis not present

## 2024-05-01 DIAGNOSIS — Z23 Encounter for immunization: Secondary | ICD-10-CM | POA: Diagnosis not present

## 2024-05-03 ENCOUNTER — Ambulatory Visit (HOSPITAL_COMMUNITY)
Admission: RE | Admit: 2024-05-03 | Discharge: 2024-05-03 | Disposition: A | Discharge status: Home / Self Care | Source: Ambulatory Visit | Attending: Internal Medicine | Admitting: Internal Medicine

## 2024-05-03 ENCOUNTER — Encounter (HOSPITAL_COMMUNITY): Payer: Self-pay | Admitting: Internal Medicine

## 2024-05-03 VITALS — BP 124/78 | HR 62 | Ht 66.0 in | Wt 141.8 lb

## 2024-05-03 DIAGNOSIS — I493 Ventricular premature depolarization: Secondary | ICD-10-CM | POA: Diagnosis not present

## 2024-05-03 DIAGNOSIS — Z79899 Other long term (current) drug therapy: Secondary | ICD-10-CM | POA: Insufficient documentation

## 2024-05-03 DIAGNOSIS — G4733 Obstructive sleep apnea (adult) (pediatric): Secondary | ICD-10-CM | POA: Insufficient documentation

## 2024-05-03 DIAGNOSIS — E119 Type 2 diabetes mellitus without complications: Secondary | ICD-10-CM | POA: Insufficient documentation

## 2024-05-03 DIAGNOSIS — Z7901 Long term (current) use of anticoagulants: Secondary | ICD-10-CM | POA: Insufficient documentation

## 2024-05-03 DIAGNOSIS — Z7984 Long term (current) use of oral hypoglycemic drugs: Secondary | ICD-10-CM | POA: Insufficient documentation

## 2024-05-03 DIAGNOSIS — I714 Abdominal aortic aneurysm, without rupture, unspecified: Secondary | ICD-10-CM | POA: Diagnosis not present

## 2024-05-03 DIAGNOSIS — I2510 Atherosclerotic heart disease of native coronary artery without angina pectoris: Secondary | ICD-10-CM | POA: Diagnosis not present

## 2024-05-03 DIAGNOSIS — I110 Hypertensive heart disease with heart failure: Secondary | ICD-10-CM | POA: Insufficient documentation

## 2024-05-03 DIAGNOSIS — I5022 Chronic systolic (congestive) heart failure: Secondary | ICD-10-CM | POA: Insufficient documentation

## 2024-05-03 DIAGNOSIS — I350 Nonrheumatic aortic (valve) stenosis: Secondary | ICD-10-CM | POA: Diagnosis not present

## 2024-05-03 DIAGNOSIS — I482 Chronic atrial fibrillation, unspecified: Secondary | ICD-10-CM | POA: Insufficient documentation

## 2024-05-03 NOTE — Progress Notes (Signed)
 Advanced [facility] Note  Date:  05/03/2024   ID:  Ricardo [name], DOB March 05, 1936, MRN [id number]  Location: Home  Provider location: [facility] Type of Visit: Established patient  PCP:  Roanna Ezekiel NOVAK, MD  Cardiologist:  None Primary HF: Norlan Rann  Chief Complaint: Heart Failure follow-up   History of Present Illness:  [name] is a 88y.o. male  with h/o HTN, diabetes, CAD and chronic atrial fibrillation.   Previously followed by Dr. Epifanio at [facility]. He is s/p previous AFL ablation. Echo 12/13 EF 40-45%. Had sleep study in past and placed on CPAP. He was subsequently told OSA was mild and CPAP stopped.    In January 2018 began to develop HF symptoms. AF rate elevated. Echo 40-45% with moderate RV dysfunction. Mild AS. Diuresed 9 pounds and placed on metoprolol  and digoxin  for HR control. Weight on d/c 145.   Cath 4/18 Very heavily calcified coronary arteries with high grade lesion in moderate-sized OM1 and tandem 70% and 80% lesions in mid LAD. Distal PDA 90%. Treated medically.   Echo 11/17/21 EF 45% Moderate RV dysfunction Mild AS severe  biatrial enlargement  Went to ED on 12/27 for hematuria. CT with non-obstructing stones. Small AAA (4.0cm) Had f/u with Scott McDiarmid in Urology. Cystoscopy negative. No further hematuria.  Here for routine f/u. Doing well. Stays active with walking. No CP or SOB. No problems with meds.     cMRI 1/19   1) Mild LVE with moderate LVH Diffuse hypokinesis worse in the inferolateral wall. Quantitative EF 43% 2) SEMI seen in basal anterolateral and inferolateral walls as well as mid inferolateral wall 3) Severe LAE Moderate RAE 4) PFO present 5) Moderate appearing MR 6) Tri- leaflet AV with some thickening and restricted motion suggest echo correlation   Echo 11/06/16 LVEF 40%, Mild/Mod MR, Severe LAE, Severe RAE, + Patent foramen ovale   Cath 11/15/16 Very heavily calcified coronary  arteries with high grade lesion in moderate-sized OM1 and tandem 70% and 80% lesions in mid LAD. Distal PDA 90%. Treated medically.    RA =  6 RV = 48/6 PA = 48/19 (29) PCW = 17 Ao = 135/89 (108) LV = 147/14 Fick cardiac output/index = 3.8/2.2 PVR = 3.2 SVR = 2131 FA sat = 98% PA sat = 67%. 69%   Past Medical History:  Diagnosis Date   Atrial fibrillation [facility])    on coumadin .  cardiologist is in [facility] dr Dick Sauers.  S/P multiple DCCVs and prior ablation.    Basal cell carcinoma 12/23/2014   nod-left forearm (CX35FU)   BPH (benign prostatic hyperplasia)    by ST in 2000.    CHF (congestive heart failure) (HCC)    Complication of anesthesia     sometimes slow to wake up   Diabetes mellitus without complication (HCC)    Diverticulitis    question if he ever had imaging confirmed diverticulitis   GERD (gastroesophageal reflux disease)    GI bleed    Gout    Hypertension    Kidney stones    treated with lithotripsy    Pneumonia    PONV (postoperative nausea and vomiting)    Rupture of biceps tendon 2006   left distal bicep tendon   SCCA (squamous cell carcinoma) of skin 01/29/2020   Left Malar Cheek (in situ)   Squamous cell carcinoma of skin 04/08/2019   in situ-left cheek-txpbx   UTI (urinary tract infection)  Past Surgical History:  Procedure Laterality Date   CARDIOVERSION     CATARACT EXTRACTION, BILATERAL     CHOLECYSTECTOMY     COLONOSCOPY  04/18/2012   Procedure: COLONOSCOPY;  Surgeon: Toribio SHAUNNA Cedar, MD;  Location: [facility] ENDOSCOPY;  Service: Endoscopy;  Laterality: N/A;   DISTAL BICEPS TENDON REPAIR  2006   dr freada   HEMORRHOID SURGERY     twice   HERNIA REPAIR     LITHOTRIPSY     of kidney stones.    PALATE / UVULA BIOPSY / EXCISION     uvula excision   RIGHT/LEFT HEART CATH AND CORONARY ANGIOGRAPHY N/A 11/15/2016   Procedure: Right/Left Heart Cath and Coronary Angiography;  Surgeon: Toribio JONELLE Fuel, MD;  Location: [facility] INVASIVE CV  LAB;  Service: Cardiovascular;  Laterality: N/A;   thyroid  nodule excision       Current Outpatient Medications  Medication Sig Dispense Refill   b complex vitamins tablet Take 1 tablet by mouth daily.     Coenzyme Q10 (CO Q10) 200 MG CAPS Take 200 mcg by mouth daily.     cyanocobalamin  100 MCG tablet Take 1,000 mcg by mouth daily.     ELIQUIS  2.5 MG TABS tablet TAKE 1 TABLET(2.5 MG) BY MOUTH TWICE DAILY 60 tablet 11   empagliflozin (JARDIANCE) 10 MG TABS tablet Take 10 mg by mouth daily.     ENTRESTO  24-26 MG TAKE 1 TABLET BY MOUTH TWICE DAILY 60 tablet 11   eplerenone  (INSPRA ) 25 MG tablet Take 0.5 tablets (12.5 mg total) by mouth daily. 45 tablet 3   ipratropium (ATROVENT) 0.03 % nasal spray ipratropium bromide 21 mcg (0.03 %) nasal spray     meclizine (ANTIVERT) 12.5 MG tablet Take 12.5 mg by mouth 3 (three) times daily as needed for dizziness.     metoprolol  succinate (TOPROL -XL) 50 MG 24 hr tablet TAKE 1 TABLET(50 MG) BY MOUTH DAILY WITH OR IMMEDIATELY FOLLOWING A MEAL 90 tablet 1   potassium chloride  SA (KLOR-CON  M) 20 MEQ tablet Take 2 tablets (40 mEq total) by mouth daily. 180 tablet 3   rosuvastatin  (CRESTOR ) 5 MG tablet Take 1 tablet (5 mg total) by mouth daily. 90 tablet 3   torsemide (DEMADEX) 20 MG tablet Take 10 mg by mouth daily.     vitamin C  (ASCORBIC ACID ) 250 MG tablet Take 250 mg by mouth daily.     No current facility-administered medications for this encounter.    Allergies:   Codeine and Tape   Social History:  The patient  reports that he has never smoked. He has never used smokeless tobacco. He reports current alcohol use. He reports that he does not use drugs.   Family History:  The patient's family history includes Congestive Heart Failure in his father; Stroke in his father; Tuberculosis in his mother.   ROS:  Please see the history of present illness.   All other systems are personally reviewed and negative.   Vitals:   05/03/24 1439  BP: 124/78   Pulse: 62  SpO2: 98%  Weight: 64.3 kg (141 lb 12.8 oz)  Height: 5' 6 (1.676 m)     Exam:  General:  Elderly thin No resp difficulty HEENT: normal Neck: supple. no JVD. Carotids 2+ bilat; no bruits. No lymphadenopathy or thryomegaly appreciated. Cor: PMI nondisplaced. irregular rate & rhythm. 2/6 AS s2 ok  Lungs: clear Abdomen: soft, nontender, nondistended. No hepatosplenomegaly. No bruits or masses. Good bowel sounds. Extremities: no cyanosis, clubbing, rash, edema Neuro:  alert & orientedx3, cranial nerves grossly intact. moves all 4 extremities w/o difficulty. Affect pleasant   ECG: AF 71 RBBB Personally reviewed   Recent Labs: 08/11/2023: BUN 27; Creatinine, Ser 1.50; Hemoglobin 13.9; Platelets 178; Potassium 3.3; Sodium 137  Personally reviewed   Wt Readings from Last 3 Encounters:  05/03/24 64.3 kg (141 lb 12.8 oz)  08/25/23 64.3 kg (141 lb 12.8 oz)  12/27/22 66.2 kg (146 lb)      ASSESSMENT AND PLAN:  1. Chronic systolic HF - Echo 11/06/16 EF 59-54% with moderate RV dysfunction. Seems out of proportion to CAD. ? PVC related with 10% PVCs on Holter 9/18. - SPEP negative. CMRI 2019 no infiltrative process EF 43% - Echo 9/21 EF 40-45% RV mildly down  AoV sclerosis without stenosis.  - Echo  11/17/21 EF 45% Moderate RV dysfunction Mild AS severe  biatrial enlargement - Doing well. NYHA I-II - Volume ok - Continue torsemide 10mg  daily - Continue Toprol  50mg  daily. - Continue entresto  to 49-51 mg twice a day. Have not titrated due to intermittent lightheadedness.  - On eplerenone  12.5 daily. Unable to tolerate spiro due to severe gynecomastia. - Continue Jardiance 10 - stable - due for repeat echo  2. Chronic AF with slow VR - Rate controlled - HR typically in 50- 70s.  - CHADSVASC = 6 (age, DM, HTN, HF, vasc disease) - stroke risk 9.7% year - Continue eliquis  2.5 bid. No bleeding   3. Mild aortic stenosis - Mild on echo 4/23 - Due for repeat echo   4. HTN   - Blood pressure well controlled. Continue current regimen.  5. DM2 - Per PCP. Now on Jardiance   6. Suspected sleep apnea.  - Sleep study was negative.     7. CAD - He has highly calcified coronary arteries with borderline lesion in mLAD.  - No s/s angina. Continue medical therapy with statin. Off ASA with Eliquis   8. PVCs.  - 10% PVCs on Holter 9/18 - EF has been stable despite PVCs. IF ef dropping will need to repeat monitor to reassess PVC burden  9. AAA - CT 12/24 4.0 cm - will get CT to f/u     Signed, Toribio Fuel, MD  05/03/2024 3:07 PM  [facility] [address] [facility] [place] [address] [phone] (office) [phone] (fax)

## 2024-05-03 NOTE — Patient Instructions (Signed)
 Medication Changes:  No Changes In Medications at this time.   Testing/Procedures:  ECHOCARDIOGRAM AS SCHEDULED   CT OF ABDOMEN AND PELVIS WITH CONTRAST--SCHEDULING WILL CALL TO ARRANGE THIS   Follow-Up in: 6 MONTHS PLEASE CALL OUR OFFICE AROUND JANUARY TO GET SCHEDULED FOR YOUR MARCH APPOINTMENT. PHONE NUMBER IS [phone] OPTION 2   At the [facility], you and your health needs are our priority. We have a designated team specialized in the treatment of Heart Failure. This Care Team includes your primary Heart Failure Specialized Cardiologist (physician), Advanced Practice Providers (APPs- Physician Assistants and Nurse Practitioners), and Pharmacist who all work together to provide you with the care you need, when you need it.   You may see any of the following providers on your designated Care Team at your next follow up:  Dr. Toribio Fuel Dr. Ezra Shuck Dr. Ria Commander Dr. Odis Brownie Greig Mosses, NP Caffie Shed, [place] [facility] [place], [place] Beckey Coe, NP [place] Lee, NP Tinnie Redman, PharmD   Please be sure to bring in all your medications bottles to every appointment.   Need to Contact Us :  If you have any questions or concerns before your next appointment please send us  a message through [place] or call our office at [phone].    TO LEAVE A MESSAGE FOR THE NURSE SELECT OPTION 2, PLEASE LEAVE A MESSAGE INCLUDING: YOUR NAME DATE OF BIRTH CALL BACK NUMBER REASON FOR CALL**this is important as we prioritize the call backs  YOU WILL RECEIVE A CALL BACK THE SAME DAY AS LONG AS YOU CALL BEFORE 4:00 PM

## 2024-05-03 NOTE — Addendum Note (Signed)
 Encounter addended by: Tita Andriette NOVAK, RN on: 05/03/2024 3:32 PM  Actions taken: Pend clinical note, Clinical Note Signed, Order list changed, Diagnosis association updated

## 2024-05-16 DIAGNOSIS — L570 Actinic keratosis: Secondary | ICD-10-CM | POA: Diagnosis not present

## 2024-05-27 ENCOUNTER — Ambulatory Visit (HOSPITAL_COMMUNITY): Admission: RE | Admit: 2024-05-27 | Source: Ambulatory Visit

## 2024-06-04 ENCOUNTER — Ambulatory Visit (HOSPITAL_COMMUNITY)

## 2024-06-04 DIAGNOSIS — E1165 Type 2 diabetes mellitus with hyperglycemia: Secondary | ICD-10-CM | POA: Diagnosis not present

## 2024-06-04 DIAGNOSIS — G3184 Mild cognitive impairment, so stated: Secondary | ICD-10-CM | POA: Diagnosis not present

## 2024-06-04 DIAGNOSIS — M545 Low back pain, unspecified: Secondary | ICD-10-CM | POA: Diagnosis not present

## 2024-06-04 DIAGNOSIS — W19XXXA Unspecified fall, initial encounter: Secondary | ICD-10-CM | POA: Diagnosis not present

## 2024-06-04 DIAGNOSIS — Z23 Encounter for immunization: Secondary | ICD-10-CM | POA: Diagnosis not present

## 2024-06-04 DIAGNOSIS — W19XXXD Unspecified fall, subsequent encounter: Secondary | ICD-10-CM | POA: Diagnosis not present

## 2024-06-04 DIAGNOSIS — R310 Gross hematuria: Secondary | ICD-10-CM | POA: Diagnosis not present

## 2024-06-04 DIAGNOSIS — I10 Essential (primary) hypertension: Secondary | ICD-10-CM | POA: Diagnosis not present

## 2024-06-17 ENCOUNTER — Ambulatory Visit (HOSPITAL_COMMUNITY): Admission: RE | Admit: 2024-06-17 | Source: Ambulatory Visit

## 2024-06-20 DIAGNOSIS — I5022 Chronic systolic (congestive) heart failure: Secondary | ICD-10-CM | POA: Diagnosis not present

## 2024-06-20 DIAGNOSIS — E1122 Type 2 diabetes mellitus with diabetic chronic kidney disease: Secondary | ICD-10-CM | POA: Diagnosis not present

## 2024-06-20 DIAGNOSIS — G3184 Mild cognitive impairment, so stated: Secondary | ICD-10-CM | POA: Diagnosis not present

## 2024-06-20 DIAGNOSIS — E1165 Type 2 diabetes mellitus with hyperglycemia: Secondary | ICD-10-CM | POA: Diagnosis not present

## 2024-06-20 DIAGNOSIS — R310 Gross hematuria: Secondary | ICD-10-CM | POA: Diagnosis not present

## 2024-06-20 DIAGNOSIS — R634 Abnormal weight loss: Secondary | ICD-10-CM | POA: Diagnosis not present

## 2024-06-20 DIAGNOSIS — Z125 Encounter for screening for malignant neoplasm of prostate: Secondary | ICD-10-CM | POA: Diagnosis not present

## 2024-06-20 DIAGNOSIS — W19XXXD Unspecified fall, subsequent encounter: Secondary | ICD-10-CM | POA: Diagnosis not present

## 2024-06-20 DIAGNOSIS — H3530 Unspecified macular degeneration: Secondary | ICD-10-CM | POA: Diagnosis not present

## 2024-06-20 DIAGNOSIS — Z23 Encounter for immunization: Secondary | ICD-10-CM | POA: Diagnosis not present

## 2024-06-20 DIAGNOSIS — Z0001 Encounter for general adult medical examination with abnormal findings: Secondary | ICD-10-CM | POA: Diagnosis not present

## 2024-06-20 DIAGNOSIS — Z1211 Encounter for screening for malignant neoplasm of colon: Secondary | ICD-10-CM | POA: Diagnosis not present

## 2024-06-27 ENCOUNTER — Other Ambulatory Visit: Payer: Self-pay | Admitting: Internal Medicine

## 2024-06-27 DIAGNOSIS — R634 Abnormal weight loss: Secondary | ICD-10-CM

## 2024-07-01 ENCOUNTER — Ambulatory Visit
Admission: RE | Admit: 2024-07-01 | Discharge: 2024-07-01 | Disposition: A | Discharge status: Home / Self Care | Source: Ambulatory Visit | Attending: Internal Medicine | Admitting: Internal Medicine

## 2024-07-01 ENCOUNTER — Other Ambulatory Visit: Payer: Self-pay | Admitting: Internal Medicine

## 2024-07-01 DIAGNOSIS — N400 Enlarged prostate without lower urinary tract symptoms: Secondary | ICD-10-CM | POA: Diagnosis not present

## 2024-07-01 DIAGNOSIS — F03A3 Unspecified dementia, mild, with mood disturbance: Secondary | ICD-10-CM

## 2024-07-01 DIAGNOSIS — R634 Abnormal weight loss: Secondary | ICD-10-CM

## 2024-07-01 DIAGNOSIS — I272 Pulmonary hypertension, unspecified: Secondary | ICD-10-CM | POA: Diagnosis not present

## 2024-07-01 DIAGNOSIS — N200 Calculus of kidney: Secondary | ICD-10-CM | POA: Diagnosis not present

## 2024-08-07 ENCOUNTER — Ambulatory Visit (HOSPITAL_COMMUNITY)
Admission: RE | Admit: 2024-08-07 | Discharge: 2024-08-07 | Disposition: A | Discharge status: Home / Self Care | Source: Ambulatory Visit | Attending: Internal Medicine | Admitting: Internal Medicine

## 2024-08-07 DIAGNOSIS — I110 Hypertensive heart disease with heart failure: Secondary | ICD-10-CM | POA: Diagnosis not present

## 2024-08-07 DIAGNOSIS — I083 Combined rheumatic disorders of mitral, aortic and tricuspid valves: Secondary | ICD-10-CM | POA: Insufficient documentation

## 2024-08-07 DIAGNOSIS — I371 Nonrheumatic pulmonary valve insufficiency: Secondary | ICD-10-CM | POA: Diagnosis not present

## 2024-08-07 DIAGNOSIS — I714 Abdominal aortic aneurysm, without rupture, unspecified: Secondary | ICD-10-CM | POA: Diagnosis not present

## 2024-08-07 DIAGNOSIS — I5022 Chronic systolic (congestive) heart failure: Secondary | ICD-10-CM | POA: Diagnosis present

## 2024-08-10 LAB — ECHOCARDIOGRAM COMPLETE
AR max vel: 1.42 cm2
AV Area VTI: 1.45 cm2
AV Area mean vel: 1.38 cm2
AV Mean grad: 10.3 mmHg
AV Peak grad: 19.2 mmHg
Ao pk vel: 2.19 m/s
Area-P 1/2: 4.36 cm2
S' Lateral: 2.8 cm

## 2024-08-13 ENCOUNTER — Other Ambulatory Visit (HOSPITAL_COMMUNITY): Payer: Self-pay | Admitting: Internal Medicine

## 2024-08-13 DIAGNOSIS — I5022 Chronic systolic (congestive) heart failure: Secondary | ICD-10-CM

## 2024-09-10 NOTE — Progress Notes (Incomplete)
 "   Advanced Heart Failure Clinic Note PCP:  Roanna Ezekiel NOVAK, MD  Cardiologist:  None Primary HF: [place]   HPI: Ricardo [name] is a 89 y.o. male with h/o HTN, diabetes, CAD, dementia, and chronic atrial fibrillation.   Previously followed by Dr. Epifanio at [facility]. He is s/p previous AFL ablation. Echo 12/13 EF 40-45%. Had sleep study in past and placed on CPAP. He was subsequently told OSA was mild and CPAP stopped.    In 1/18 began to develop HF symptoms. AF rate elevated. Echo 40-45% with moderate RV dysfunction. Mild AS. Diuresed 9 pounds and placed on metoprolol  and digoxin  for HR control. Weight on d/c 145.   Cath 4/18 Very heavily calcified coronary arteries with high grade lesion in moderate-sized OM1 and tandem 70% and 80% lesions in mid LAD. Distal PDA 90%. Treated medically.   Echo 4/23 EF 45% Moderate RV dysfunction Mild AS severe biatrial enlargement  Went to ED on 12/24 for hematuria. CT with non-obstructing stones. Small AAA (4.0cm) Had f/u with Scott McDiarmid in Urology. Cystoscopy negative. No further hematuria.  Echo 12/25 EF 45-50%, mild LVH, moderate RV dysfunction and elevated PA pressures, mild/mod TR    9/25: Here for routine f/u. Doing well. Stays active with walking. No CP or SOB. No problems with meds.    He returns today for HF follow up. Overall feeling ***. NYHA ***. Reports {Symptoms; cardiac:12860::dyspnea,fatigue}. Denies {Symptoms; cardiac:12860::chest pain,dyspnea,fatigue,near-syncope,orthopnea,palpitations,dizziness,abnormal bleeding}. Able to perform ADLs. Appetite okay. Weight at home ***. BP at home***. Compliant with all medications.    cMRI 1/19   1) Mild LVE with moderate LVH Diffuse hypokinesis worse in the inferolateral wall. Quantitative EF 43% 2) SEMI seen in basal anterolateral and inferolateral walls as well as mid inferolateral wall 3) Severe LAE Moderate RAE 4) PFO present 5) Moderate appearing MR 6)  Tri- leaflet AV with some thickening and restricted motion suggest echo correlation   Echo 11/06/16 LVEF 40%, Mild/Mod MR, Severe LAE, Severe RAE, + Patent foramen ovale   Cath 11/15/16 Very heavily calcified coronary arteries with high grade lesion in moderate-sized OM1 and tandem 70% and 80% lesions in mid LAD. Distal PDA 90%. Treated medically.    RA =  6 RV = 48/6 PA = 48/19 (29) PCW = 17 Ao = 135/89 (108) LV = 147/14 Fick cardiac output/index = 3.8/2.2 PVR = 3.2 SVR = 2131 FA sat = 98% PA sat = 67%. 69%   Past Medical History:  Diagnosis Date   Atrial fibrillation [facility])    on coumadin .  cardiologist is in [facility] dr Dick Sauers.  S/P multiple DCCVs and prior ablation.    Basal cell carcinoma 12/23/2014   nod-left forearm (CX35FU)   BPH (benign prostatic hyperplasia)    by ST in 2000.    CHF (congestive heart failure) (HCC)    Complication of anesthesia     sometimes slow to wake up   Diabetes mellitus without complication (HCC)    Diverticulitis    question if he ever had imaging confirmed diverticulitis   GERD (gastroesophageal reflux disease)    GI bleed    Gout    Hypertension    Kidney stones    treated with lithotripsy    Pneumonia    PONV (postoperative nausea and vomiting)    Rupture of biceps tendon 2006   left distal bicep tendon   SCCA (squamous cell carcinoma) of skin 01/29/2020   Left Malar Cheek (in situ)   Squamous cell carcinoma  of skin 04/08/2019   in situ-left cheek-txpbx   UTI (urinary tract infection)    Past Surgical History:  Procedure Laterality Date   CARDIOVERSION     CATARACT EXTRACTION, BILATERAL     CHOLECYSTECTOMY     COLONOSCOPY  04/18/2012   Procedure: COLONOSCOPY;  Surgeon: Toribio SHAUNNA Cedar, MD;  Location: [facility] ENDOSCOPY;  Service: Endoscopy;  Laterality: N/A;   DISTAL BICEPS TENDON REPAIR  2006   dr freada   HEMORRHOID SURGERY     twice   HERNIA REPAIR     LITHOTRIPSY     of kidney stones.    PALATE / UVULA  BIOPSY / EXCISION     uvula excision   RIGHT/LEFT HEART CATH AND CORONARY ANGIOGRAPHY N/A 11/15/2016   Procedure: Right/Left Heart Cath and Coronary Angiography;  Surgeon: Toribio JONELLE Fuel, MD;  Location: [facility] INVASIVE CV LAB;  Service: Cardiovascular;  Laterality: N/A;   thyroid  nodule excision       Current Outpatient Medications  Medication Sig Dispense Refill   b complex vitamins tablet Take 1 tablet by mouth daily.     Coenzyme Q10 (CO Q10) 200 MG CAPS Take 200 mcg by mouth daily.     cyanocobalamin  100 MCG tablet Take 1,000 mcg by mouth daily.     ELIQUIS  2.5 MG TABS tablet TAKE 1 TABLET(2.5 MG) BY MOUTH TWICE DAILY 60 tablet 11   empagliflozin (JARDIANCE) 10 MG TABS tablet Take 10 mg by mouth daily.     ENTRESTO  24-26 MG TAKE 1 TABLET BY MOUTH TWICE DAILY 60 tablet 11   eplerenone  (INSPRA ) 25 MG tablet Take 0.5 tablets (12.5 mg total) by mouth daily. 45 tablet 3   ipratropium (ATROVENT) 0.03 % nasal spray ipratropium bromide 21 mcg (0.03 %) nasal spray     meclizine (ANTIVERT) 12.5 MG tablet Take 12.5 mg by mouth 3 (three) times daily as needed for dizziness.     metoprolol  succinate (TOPROL -XL) 50 MG 24 hr tablet TAKE 1 TABLET(50 MG) BY MOUTH DAILY WITH OR IMMEDIATELY FOLLOWING A MEAL 90 tablet 1   potassium chloride  SA (KLOR-CON  M) 20 MEQ tablet Take 2 tablets (40 mEq total) by mouth daily. 180 tablet 3   rosuvastatin  (CRESTOR ) 5 MG tablet Take 1 tablet (5 mg total) by mouth daily. 90 tablet 3   torsemide (DEMADEX) 20 MG tablet Take 10 mg by mouth daily.     vitamin C  (ASCORBIC ACID ) 250 MG tablet Take 250 mg by mouth daily.     No current facility-administered medications for this visit.    Allergies:   Codeine and Tape   Social History:  The patient  reports that he has never smoked. He has never used smokeless tobacco. He reports current alcohol use. He reports that he does not use drugs.   Family History:  The patient's family history includes Congestive Heart Failure in  his father; Stroke in his father; Tuberculosis in his mother.   ROS:  Please see the history of present illness.   All other systems are personally reviewed and negative.   There were no vitals filed for this visit.  Wt Readings from Last 3 Encounters:  05/03/24 64.3 kg (141 lb 12.8 oz)  08/25/23 64.3 kg (141 lb 12.8 oz)  12/27/22 66.2 kg (146 lb)    PHYSICAL EXAM:   General: *** appearing. No distress  Cardiac: JVP ~*** cm. No murmurs  Resp: Lung sounds clear and equal B/L Abdomen: Soft, non-distended.  Extremities: Warm and dry.  ***  edema.  Neuro: A&O x3. Affect pleasant.   ASSESSMENT AND PLAN:  1. Chronic systolic HF - Echo 3/18 EF 40-45% with moderate RV dysfunction. Seems out of proportion to CAD. ? PVC related with 10% PVCs on Holter 9/18. - SPEP negative. CMRI 2019 no infiltrative process EF 43% - Echo 9/21 EF 40-45% RV mildly down  AoV sclerosis without stenosis.  - Echo  4/23 EF 45% Moderate RV dysfunction Mild AS severe biatrial enlargement - Echo 12/25 EF 45-50%, RV mod down, BAE, mild/mod TR, mild AS - Doing well. NYHA I-II - Volume ok - Continue torsemide 10 mg daily - Continue Toprol  50 mg daily - Continue entresto  to 49-51 mg bid. Have not titrated due to intermittent lightheadedness.  - On eplerenone  12.5 mg daily. Unable to tolerate spiro due to severe gynecomastia. - Continue Jardiance10 mg daily - stable  2. Chronic AF with slow VR - Rate controlled - HR typically in 50- 70s.  - CHADSVASC = 6 (age, DM, HTN, HF, vasc disease) - stroke risk 9.7% year - Continue eliquis  2.5 bid. Has had some epistaxis, held for a few days   3. Mild aortic stenosis - Mild on echo 4/23 - stable  4. HTN  - Blood pressure well controlled. Continue current regimen.  5. DM2 - Per PCP. Now on Jardiance   6. Suspected sleep apnea.  - Sleep study was negative.     7. CAD - He has highly calcified coronary arteries with borderline lesion in mLAD.  - No s/s angina.  Continue medical therapy with statin. Off ASA with Eliquis   8. PVCs.  - 10% PVCs on Holter 9/18 - EF has been stable despite PVCs.   9. AAA - CT 12/24 4.0 cm - CTA A/P ordered   Signed, Amulya Quintin, NP  09/10/2024 1:37 PM  "

## 2024-09-11 ENCOUNTER — Other Ambulatory Visit: Payer: Self-pay | Admitting: Internal Medicine

## 2024-09-11 ENCOUNTER — Ambulatory Visit (HOSPITAL_COMMUNITY)
Admission: RE | Admit: 2024-09-11 | Discharge: 2024-09-11 | Disposition: A | Source: Ambulatory Visit | Attending: Internal Medicine | Admitting: Internal Medicine

## 2024-09-11 ENCOUNTER — Encounter (HOSPITAL_COMMUNITY): Payer: Self-pay | Admitting: Internal Medicine

## 2024-09-11 ENCOUNTER — Other Ambulatory Visit (HOSPITAL_COMMUNITY): Payer: Self-pay | Admitting: Internal Medicine

## 2024-09-11 ENCOUNTER — Ambulatory Visit (HOSPITAL_COMMUNITY)
Admission: RE | Admit: 2024-09-11 | Discharge: 2024-09-11 | Disposition: A | Discharge status: Home / Self Care | Source: Ambulatory Visit | Attending: Internal Medicine | Admitting: Internal Medicine

## 2024-09-11 VITALS — BP 110/70 | HR 39 | Wt 134.2 lb

## 2024-09-11 DIAGNOSIS — I2510 Atherosclerotic heart disease of native coronary artery without angina pectoris: Secondary | ICD-10-CM | POA: Diagnosis not present

## 2024-09-11 DIAGNOSIS — Z01818 Encounter for other preprocedural examination: Secondary | ICD-10-CM | POA: Insufficient documentation

## 2024-09-11 DIAGNOSIS — R001 Bradycardia, unspecified: Secondary | ICD-10-CM

## 2024-09-11 DIAGNOSIS — I482 Chronic atrial fibrillation, unspecified: Secondary | ICD-10-CM | POA: Diagnosis not present

## 2024-09-11 DIAGNOSIS — I495 Sick sinus syndrome: Secondary | ICD-10-CM

## 2024-09-11 DIAGNOSIS — I493 Ventricular premature depolarization: Secondary | ICD-10-CM

## 2024-09-11 DIAGNOSIS — I5022 Chronic systolic (congestive) heart failure: Secondary | ICD-10-CM | POA: Diagnosis not present

## 2024-09-11 DIAGNOSIS — I350 Nonrheumatic aortic (valve) stenosis: Secondary | ICD-10-CM

## 2024-09-11 NOTE — Patient Instructions (Signed)
 Medication Changes:  None, continue current medications  Testing/Procedures:  Your provider has recommended that  you wear a Zio Patch for 3 days.  This monitor will record your heart rhythm for our review.  IF you have any symptoms while wearing the monitor please press the button.  If you have any issues with the patch or you notice a red or orange light on it please call the company at [phone].  Once you remove the patch please mail it back to the company as soon as possible so we can get the results.   Special Instructions // Education:  Do the following things EVERYDAY: Weigh yourself in the morning before breakfast. Write it down and keep it in a log. Take your medicines as prescribed Eat low salt foods--Limit salt (sodium) to 2000 mg per day.  Stay as active as you can everyday Limit all fluids for the day to less than 2 liters   Follow-Up in: 2 months   At the [facility], you and your health needs are our priority. We have a designated team specialized in the treatment of Heart Failure. This Care Team includes your primary Heart Failure Specialized Cardiologist (physician), Advanced Practice Providers (APPs- Physician Assistants and Nurse Practitioners), and Pharmacist who all work together to provide you with the care you need, when you need it.   You may see any of the following providers on your designated Care Team at your next follow up:  Dr. Toribio Fuel Dr. Ezra Shuck Dr. Odis Brownie Greig Mosses, NP Caffie Shed, [place] [facility] [place], [place] Beckey Coe, NP Jordan Lee, NP Tinnie Redman, PharmD   Please be sure to bring in all your medications bottles to every appointment.   Need to Contact Us :  If you have any questions or concerns before your next appointment please send us  a message through [place] or call our office at [phone].    TO LEAVE A MESSAGE FOR THE NURSE SELECT OPTION 2, PLEASE LEAVE A MESSAGE  INCLUDING: YOUR NAME DATE OF BIRTH CALL BACK NUMBER REASON FOR CALL**this is important as we prioritize the call backs  YOU WILL RECEIVE A CALL BACK THE SAME DAY AS LONG AS YOU CALL BEFORE 4:00 PM

## 2024-09-11 NOTE — Progress Notes (Addendum)
 "   Advanced Heart Failure Clinic Note PCP:  Ricardo Ezekiel NOVAK, MD  Cardiologist:  None Primary HF: Ricardo [name]   HPI: Ricardo [name] is a 89 y.o. male with h/o HTN, diabetes, CAD, dementia, and chronic atrial fibrillation.   Previously followed by Dr. Epifanio at [facility]. He is s/p previous AFL ablation. Echo 12/13 EF 40-45%. Had sleep study in past and placed on CPAP. He was subsequently told OSA was mild and CPAP stopped.    In 1/18 began to develop HF symptoms. AF rate elevated. Echo 40-45% with moderate RV dysfunction. Mild AS. Diuresed 9 pounds and placed on metoprolol  and digoxin  for HR control. Weight on d/c 145.   Cath 4/18 Very heavily calcified coronary arteries with high grade lesion in moderate-sized OM1 and tandem 70% and 80% lesions in mid LAD. Distal PDA 90%. Treated medically.   Echo 4/23 EF 45% Moderate RV dysfunction Mild AS severe biatrial enlargement  Went to ED on 12/24 for hematuria. CT with non-obstructing stones. Small AAA (4.0cm) Had f/u with Ricardo [name] in Urology. Cystoscopy negative. No further hematuria.  Echo 12/25 EF 45-50%, mild LVH, moderate RV dysfunction and elevated PA pressures, mild/mod TR  CT C/A/P: Aortic arch 4.0 infrarenal aorta 3.8cm   Here for unscheduled visit with his wife due to several issues. Having some epistaxis and had some bleeding with tooth extraction recently. Previously had nasal cauterization which helped. No ongoing bleeding. Denies CP, SOB, edema or syncope.    cMRI 1/19   1) Mild LVE with moderate LVH Diffuse hypokinesis worse in the inferolateral wall. Quantitative EF 43% 2) SEMI seen in basal anterolateral and inferolateral walls as well as mid inferolateral wall 3) Severe LAE Moderate RAE 4) PFO present 5) Moderate appearing MR 6) Tri- leaflet AV with some thickening and restricted motion suggest echo correlation   Echo 11/06/16 LVEF 40%, Mild/Mod MR, Severe LAE, Severe RAE, + Patent foramen ovale   Cath  11/15/16 Very heavily calcified coronary arteries with high grade lesion in moderate-sized OM1 and tandem 70% and 80% lesions in mid LAD. Distal PDA 90%. Treated medically.    RA =  6 RV = 48/6 PA = 48/19 (29) PCW = 17 Ao = 135/89 (108) LV = 147/14 Fick cardiac output/index = 3.8/2.2 PVR = 3.2 SVR = 2131 FA sat = 98% PA sat = 67%. 69%   Past Medical History:  Diagnosis Date   Atrial fibrillation [facility])    on coumadin .  cardiologist is in [facility] dr Dick Sauers.  S/P multiple DCCVs and prior ablation.    Basal cell carcinoma 12/23/2014   nod-left forearm (CX35FU)   BPH (benign prostatic hyperplasia)    by ST in 2000.    CHF (congestive heart failure) (HCC)    Complication of anesthesia     sometimes slow to wake up   Diabetes mellitus without complication (HCC)    Diverticulitis    question if he ever had imaging confirmed diverticulitis   GERD (gastroesophageal reflux disease)    GI bleed    Gout    Hypertension    Kidney stones    treated with lithotripsy    Pneumonia    PONV (postoperative nausea and vomiting)    Rupture of biceps tendon 2006   left distal bicep tendon   SCCA (squamous cell carcinoma) of skin 01/29/2020   Left Malar Cheek (in situ)   Squamous cell carcinoma of skin 04/08/2019   in situ-left cheek-txpbx   UTI (urinary tract infection)  Past Surgical History:  Procedure Laterality Date   CARDIOVERSION     CATARACT EXTRACTION, BILATERAL     CHOLECYSTECTOMY     COLONOSCOPY  04/18/2012   Procedure: COLONOSCOPY;  Surgeon: Toribio SHAUNNA Cedar, MD;  Location: [facility] ENDOSCOPY;  Service: Endoscopy;  Laterality: N/A;   DISTAL BICEPS TENDON REPAIR  2006   dr freada   HEMORRHOID SURGERY     twice   HERNIA REPAIR     LITHOTRIPSY     of kidney stones.    PALATE / UVULA BIOPSY / EXCISION     uvula excision   RIGHT/LEFT HEART CATH AND CORONARY ANGIOGRAPHY N/A 11/15/2016   Procedure: Right/Left Heart Cath and Coronary Angiography;  Surgeon: Toribio JONELLE Fuel, MD;  [facility] INVASIVE CV LAB;  Service: Cardiovascular;  Laterality: N/A;   thyroid  nodule excision       Current Outpatient Medications  Medication Sig Dispense Refill   b complex vitamins tablet Take 1 tablet by mouth daily.     Coenzyme Q10 (CO Q10) 200 MG CAPS Take 200 mcg by mouth daily.     cyanocobalamin  100 MCG tablet Take 1,000 mcg by mouth daily.     ELIQUIS  2.5 MG TABS tablet TAKE 1 TABLET(2.5 MG) BY MOUTH TWICE DAILY 60 tablet 11   empagliflozin (JARDIANCE) 10 MG TABS tablet Take 10 mg by mouth daily.     ENTRESTO  24-26 MG TAKE 1 TABLET BY MOUTH TWICE DAILY 60 tablet 11   eplerenone  (INSPRA ) 25 MG tablet Take 0.5 tablets (12.5 mg total) by mouth daily. 45 tablet 3   ezetimibe (ZETIA) 10 MG tablet Take 10 mg by mouth daily.     fluticasone (FLONASE) 50 MCG/ACT nasal spray Place 1 spray into both nostrils at bedtime.     meclizine (ANTIVERT) 12.5 MG tablet Take 12.5 mg by mouth 3 (three) times daily as needed for dizziness.     MEMANTINE HCL PO Take 14 mg by mouth daily.     metoprolol  succinate (TOPROL -XL) 50 MG 24 hr tablet TAKE 1 TABLET(50 MG) BY MOUTH DAILY WITH OR IMMEDIATELY FOLLOWING A MEAL 90 tablet 1   potassium chloride  SA (KLOR-CON  M) 20 MEQ tablet Take 2 tablets (40 mEq total) by mouth daily. 180 tablet 3   torsemide (DEMADEX) 20 MG tablet Take 10 mg by mouth daily.     vitamin C  (ASCORBIC ACID ) 250 MG tablet Take 250 mg by mouth daily.     ipratropium (ATROVENT) 0.03 % nasal spray ipratropium bromide 21 mcg (0.03 %) nasal spray     rosuvastatin  (CRESTOR ) 5 MG tablet Take 1 tablet (5 mg total) by mouth daily. (Patient not taking: Reported on 09/11/2024) 90 tablet 3   No current facility-administered medications for this encounter.    Allergies:   Codeine and Tape   Social History:  The patient  reports that he has never smoked. He has never used smokeless tobacco. He reports current alcohol use. He reports that he does not use drugs.   Family  History:  The patient's family history includes Congestive Heart Failure in his father; Stroke in his father; Tuberculosis in his mother.   ROS:  Please see the history of present illness.   All other systems are personally reviewed and negative.   Vitals:   09/11/24 1119  BP: 110/70  Pulse: (!) 39  SpO2: 95%  Weight: 60.9 kg (134 lb 3.2 oz)    Wt Readings from Last 3 Encounters:  09/11/24 60.9 kg (134 lb 3.2  oz)  05/03/24 64.3 kg (141 lb 12.8 oz)  08/25/23 64.3 kg (141 lb 12.8 oz)    PHYSICAL EXAM:   General:  Sitting up in bed. No resp difficulty HEENT: normal Neck: supple. no JVD. 2/6 AS Cor: Irregular slow Lungs: clear Abdomen: soft, nontender, nondistended.Good bowel sounds. Extremities: no cyanosis, clubbing, rash, edema DP pulses 2+ Neuro: alert & orientedx3, cranial nerves grossly intact. moves all 4 extremities w/o difficulty. Affect pleasant  ECG: AF 39 bpm RBBB Personally reviewed  HR increases to 63 with hall walk   ASSESSMENT AND PLAN:  1. Chronic systolic HF - Echo 3/18 EF 40-45% with moderate RV dysfunction. Seems out of proportion to CAD. ? PVC related with 10% PVCs on Holter 9/18. - SPEP negative. CMRI 2019 no infiltrative process EF 43% - Echo 9/21 EF 40-45% RV mildly down  AoV sclerosis without stenosis.  - Echo  4/23 EF 45% Moderate RV dysfunction Mild AS severe biatrial enlargement - Echo 12/25 EF 45-50%, RV mod down, BAE, mild/mod TR, mild AS - Stable NYHA I-II - Volume ok  - Continue torsemide 10 mg daily - Continue Toprol  50 mg daily - Continue entresto  to 49-51 mg bid. Have not titrated due to intermittent lightheadedness.  - On eplerenone  12.5 mg daily. Unable to tolerate spiro due to severe gynecomastia. - Continue Jardiance10 mg daily  2. Chronic AF with slow VR - Rate is in high 30s recently. Asymptomatic. Will augment rate into 60s with hall walk - HR typically in 50- 70s in past - CHADSVASC = 7  (age, DM, HTN, HF, vasc disease,  previous CVA on CT) - stroke risk 11.2% year - Likely getting near the point to consider PPM. Will check zio and refer to EP - Continue eliquis  2.5 bid. Stressed need to continue. Can go to ENT for cauterization as need   3. Mild aortic stenosis - Mild on echo 4/23 - stable  4. HTN  - Blood pressure well controlled. Continue current regimen.  5. DM2 - Per PCP. Now on Jardiance   6. Suspected sleep apnea.  - Sleep study was negative.     7. CAD - He has highly calcified coronary arteries with borderline lesion in mLAD.  - No s/s angina Continue medical therapy with statin. Off ASA with Eliquis   8. PVCs.  - 10% PVCs on Holter 9/18 - EF has been stable despite PVCs.   9. AAA - CT 12/24 4.0 cm - stable  10. Abnormal ABIs - has palpable DP pulse on R foot - no s/s ischemia. No claudication - no need for further w/u at this point   Signed, Toribio Fuel, MD  09/11/2024 11:53 AM  "

## 2024-09-11 NOTE — Progress Notes (Signed)
 Zio patch placed onto patient.  All instructions and information reviewed with patient, they verbalize understanding with no questions.

## 2024-09-12 ENCOUNTER — Ambulatory Visit (HOSPITAL_COMMUNITY)

## 2024-11-26 ENCOUNTER — Ambulatory Visit (HOSPITAL_COMMUNITY): Admitting: Internal Medicine
